# Patient Record
Sex: Male | Born: 1990
Health system: Southern US, Community
[De-identification: ages and names within clinical notes are randomized; demographics above are authoritative.]

## PROBLEM LIST (undated history)

## (undated) VITALS — BP 114/67 | HR 83 | Temp 98.5°F | Resp 16 | Ht 70.5 in | Wt 145.0 lb

## (undated) DIAGNOSIS — F419 Anxiety disorder, unspecified: Secondary | ICD-10-CM

## (undated) DIAGNOSIS — Z789 Other specified health status: Secondary | ICD-10-CM

## (undated) DIAGNOSIS — F209 Schizophrenia, unspecified: Secondary | ICD-10-CM

## (undated) DIAGNOSIS — F32A Depression, unspecified: Secondary | ICD-10-CM

---
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
## 2011-09-04 ENCOUNTER — Encounter (HOSPITAL_COMMUNITY): Payer: Self-pay | Admitting: *Deleted

## 2011-09-04 ENCOUNTER — Emergency Department (HOSPITAL_COMMUNITY)
Admission: EM | Admit: 2011-09-04 | Discharge: 2011-09-05 | Disposition: A | Discharge status: Home / Self Care | Payer: Self-pay | Attending: Emergency Medicine | Admitting: Emergency Medicine

## 2011-09-04 ENCOUNTER — Emergency Department (HOSPITAL_COMMUNITY)
Admission: EM | Admit: 2011-09-04 | Discharge: 2011-09-04 | Disposition: A | Discharge status: Home / Self Care | Payer: Self-pay | Attending: Emergency Medicine | Admitting: Emergency Medicine

## 2011-09-04 ENCOUNTER — Encounter (HOSPITAL_COMMUNITY): Payer: Self-pay | Admitting: Nurse Practitioner

## 2011-09-04 DIAGNOSIS — B86 Scabies: Secondary | ICD-10-CM | POA: Insufficient documentation

## 2011-09-04 DIAGNOSIS — K1370 Unspecified lesions of oral mucosa: Secondary | ICD-10-CM

## 2011-09-04 DIAGNOSIS — B370 Candidal stomatitis: Secondary | ICD-10-CM

## 2011-09-04 MED ORDER — PERMETHRIN 5 % EX CREA: TOPICAL_CREAM | CUTANEOUS | Status: AC

## 2011-09-04 NOTE — Discharge Instructions (Signed)
Use permethrin cream as prescribed.  Wash bedding and all of your clothing in hot water!  Take benadryl for itching and try to avoid scratching as much as possible. Your family members should probably be treated as well.  Call [facility] [phone]) if you do not have a primary care doctor and would like assistance with finding one.    You may return to the ER if symptoms worsen or you have any other concerns.    Scabies Scabies are small bugs (mites) that burrow under the skin and cause red bumps and severe itching. These bugs can only be seen with a microscope. Scabies are highly contagious. They can spread easily from person to person by direct contact. They are also spread through sharing clothing or linens that have the scabies mites living in them. It is not unusual for an entire family to become infected through shared towels, clothing, or bedding.  HOME CARE INSTRUCTIONS   Your caregiver may prescribe a cream or lotion to kill the mites. If this cream is prescribed; massage the cream into the entire area of the body from the neck to the bottom of both feet. Also massage the cream into the scalp and face if your child is less than 98 year old. Avoid the eyes and mouth.   Leave the cream on for 8 to12 hours. Do not wash your hands after application. Your child should bathe or shower after the 8 to 12 hour application period. Sometimes it is helpful to apply the cream to your child at right before bedtime.   One treatment is usually effective and will eliminate approximately 95% of infestations. For severe cases, your caregiver may decide to repeat the treatment in 1 week. Everyone in your household should be treated with one application of the cream.   New rashes or burrows should not appear after successful treatment within 24 to 48 hours; however the itching and rash may last for 2 to 4 weeks after successful treatment. If your symptoms persist longer than this, see your caregiver.   Your  caregiver also may prescribe a medication to help with the itching or to help the rash go away more quickly.   Scabies can live on clothing or linens for up to 3 days. Your entire child's recently used clothing, towels, stuffed toys, and bed linens should be washed in hot water and then dried in a dryer for at least 20 minutes on high heat. Items that cannot be washed should be enclosed in a plastic bag for at least 3 days.   To help relieve itching, bathe your child in a cool bath or apply cool washcloths to the affected areas.   Your child may return to school after treatment with the prescribed cream.  SEEK MEDICAL CARE IF:   The itching persists longer than 4 weeks after treatment.   The rash spreads or becomes infected (the area has red blisters or yellow-tan crust).  Document Released: 05/12/2005 Document Revised: 05/01/2011 Document Reviewed: 09/20/2008 [facility] Patient Information 2012 [place], [place].

## 2011-09-04 NOTE — ED Provider Notes (Signed)
History     CSN: [id number]  Arrival date & time 09/04/11  1055   First MD Initiated Contact with Patient 09/04/11 1130      Chief Complaint  Patient presents with  . Rash    (Consider location/radiation/quality/duration/timing/severity/associated sxs/prior treatment) HPI History provided by pt.   Pt presents w/ severely pruritic rash x 1 week.  Started out on right hand and then spread to left hand, up arms, to waistline and inner thighs.  Has not taken anything for symptoms.  No one else in household with similar symptoms.  No pets in home.  Has been otherwise asymptomatic; denies recent fever/cough.   History reviewed. No pertinent past medical history.  No past surgical history on file.  No family history on file.  History  Substance Use Topics  . Smoking status: Never Smoker   . Smokeless tobacco: Not on file  . Alcohol Use: No      Review of Systems  All other systems reviewed and are negative.    Allergies  Review of patient's allergies indicates no known allergies.  Home Medications  No current outpatient prescriptions on file.  BP 112/63  Pulse 57  Temp(Src) 98.3 F (36.8 C) (Oral)  Resp 18  SpO2 100%  Physical Exam  Nursing note and vitals reviewed. Constitutional: He is oriented to person, place, and time. He appears well-developed and well-nourished. No distress.  HENT:  Head: Normocephalic and atraumatic.  Eyes:       Normal appearance  Neck: Normal range of motion.  Neurological: He is alert and oriented to person, place, and time.  Skin:       Diffuse, discrete, 1-23mm hyperpigmented and skin-colored papules (some scabbed) in webs of fingers, dorsal surface of hands, arms, waistline, inner aspect of thighs.  Pt scratching.  Psychiatric: He has a normal mood and affect. His behavior is normal.    ED Course  Procedures (including critical care time)  Labs Reviewed - No data to display No results found.   1. Scabies       MDM  Pt  presents w/ pruritic rash w/out associated sx.  Most likely has scabies based on exam.  D/c'd home w/ permethrin cream.  Recommended washing all clothing/bedding in hot water, to take benadryl for pruritis and avoid scratching.  Return precautions discussed.         Arie Sabina Irmo, [place] 09/04/11 1216

## 2011-09-04 NOTE — ED Notes (Signed)
The pt  Was here earlier today for scabies.  Now he has a lesion in his mouth for 2-3 hours

## 2011-09-04 NOTE — ED Notes (Signed)
C/o "bites" to hands, between fingers, back of legs, thighs x 1 week. describes as itchy. Denies new prouduct or medication use. No household contacts with similar complaint

## 2011-09-04 NOTE — ED Provider Notes (Signed)
History     CSN: [id number]  Arrival date & time 09/04/11  1055   First MD Initiated Contact with Patient 09/04/11 1130      Chief Complaint  Patient presents with  . Rash     HPI C/o "bites" to hands, between fingers, back of legs, thighs x 1 week. describes as itchy. Denies new prouduct or medication use. No household contacts with similar complaint  No past medical history on file.  No past surgical history on file.  No family history on file.  History  Substance Use Topics  . Smoking status: Never Smoker   . Smokeless tobacco: Not on file  . Alcohol Use: No      Review of Systems  All other systems reviewed and are negative.    Allergies  Review of patient's allergies indicates no known allergies.  Home Medications  No current outpatient prescriptions on file.  BP 112/63  Pulse 57  Temp(Src) 98.3 F (36.8 C) (Oral)  Resp 18  SpO2 100%  Physical Exam  Nursing note and vitals reviewed. Constitutional: He is oriented to person, place, and time. He appears well-developed and well-nourished. No distress.  HENT:  Head: Normocephalic and atraumatic.  Eyes: Pupils are equal, round, and reactive to light.  Neck: Normal range of motion.  Cardiovascular: Normal rate and intact distal pulses.   Pulmonary/Chest: No respiratory distress.  Abdominal: Normal appearance. He exhibits no distension.  Musculoskeletal: Normal range of motion.  Neurological: He is alert and oriented to person, place, and time. No cranial nerve deficit.  Skin: Skin is warm and dry. Rash noted.       Excoriations in the skin changes consistent with scabies noted on both hands and also antecubital area  Psychiatric: He has a normal mood and affect. His behavior is normal.    ED Course  Procedures (including critical care time)  Labs Reviewed - No data to display No results found.   1. Scabies       MDM          Nelia Shi, MD 09/04/11 [phone]

## 2011-09-05 MED ORDER — MAGIC MOUTHWASH W/LIDOCAINE: 5.0000 mL | Freq: Three times a day (TID) | ORAL | Status: DC | PRN

## 2011-09-05 NOTE — Discharge Instructions (Signed)
Please followup with the primary care provider or dental specialist for continued evaluation and treatment.  Thrush, Adult  Jake Neal is a yeast infection that develops in the mouth and throat and on the tongue. The medical term for this is oropharyngeal candidiasis, or OPC. Jake Neal is most common in older adults, but it can occur at any age. Jake Neal occurs when a yeast called candida grows out of control. Candida normally is present in small amounts in the mouth and on other mucous membranes. However, under certain circumstances, candida can grow rapidly, causing thrush. Jake Neal can be a recurring problem for people who have chronic illnesses or who take medications that limit the body's ability to fight infection (weakened immune system). Since these people have difficulty fighting infections, the fungus that causes thrush can spread throughout the body. This can cause life-threatening blood or organ infections. CAUSES  Candida, the yeast that causes thrush, is normally present in small amounts in the mouth and on other mucous membranes. It usually causes no harm. However, when conditions are present that allow the yeast to grow uncontrolled, it invades surrounding tissues and becomes an infection. Jake Neal is most commonly caused by the yeast Candida albicans. Less often, other forms of candida can lead to thrush. There are many types of bacteria in your mouth that normally control the growth of candida. Sometimes a new type of bacteria gets into your mouth and disrupts the balance of the germs already there. This can allow candida to overgrow. Other factors that increase your risk of developing thrush include:  An impaired ability to fight infection (weakened immune system). A normal immune system is usually strong enough to prevent candida from overgrowing.   Older adults are more likely to develop thrush because they may have weaker immune systems.   People with human immunodeficiency virus (HIV) infection  have a high likelihood of developing thrush. About 90% of people with HIV develop thrush at some point during the course of their disease.   People with diabetes are more likely to get thrush because high blood sugar levels promote overgrowth of the candida fungus.   A dry mouth (xerostomia). Dry mouth can result from overuse of mouthwashes or from certain conditions such as Sjgren's syndrome.   Pregnancy. Hormone changes during pregnancy can lead to thrush by altering the balance of bacteria in the mouth.   Poor dental care, especially in people who have false teeth.   The use of antibiotic medications. This may lead to thrush by changing the balance of bacteria in the mouth.  SYMPTOMS  Thrush can be a mild infection that causes no symptoms. If symptoms develop, they may include the following:  A burning feeling in the mouth and throat. This can occur at the start of a thrush infection.   White patches that adhere to the mouth and tongue. The tissue around the patches may be red, raw, and painful. If rubbed (during tooth brushing, for example), the patches and the tissue of the mouth may bleed easily.   A bad taste in the mouth or difficulty tasting foods.   Cottony feeling in the mouth.   Sometimes pain during eating and swallowing.  DIAGNOSIS  Your caregiver can usually diagnose thrush by exam. In addition to looking in your mouth, your caregiver will ask you questions about your health. TREATMENT  Medications that help prevent the growth of fungi (antifungals) are the standard treatment for thrush. These medications are either applied directly to the affected area (topical) or swallowed (  oral). Mild thrush In adults, mild cases of thrush may clear up with simple treatment that can be done at home. This treatment usually involves using an antifungal mouth rinse or lozenges. Treatment usually lasts about 14 days. Moderate to severe thrush  More severe thrush infections that have  spread to the esophagus are treated with an oral antifungal medication. A topical antifungal medication may also be used.   For some severe infections, a treatment period longer than 14 days may be needed.   Oral antifungal medications are almost never used during pregnancy because the fetus may be harmed. However, if a pregnant woman has a rare, severe thrush infection that has spread to her blood, oral antifungal medications may be used. In this case, the risk of harm to the mother and fetus from the severe thrush infection may be greater than the risk posed by the use of antifungal medications.  Persistent or recurrent thrush Persistent (does not go away) or recurrent (keeps coming back) cases of thrush may:  Need to be treated twice as long as the symptoms last.   Require treatment with both oral and topical antifungal medications.   People with weakened immune systems can take an antifungal medication on a continuous basis to prevent thrush infections.  It is important to treat conditions that make you more likely to get thrush, such as diabetes, human immunodeficiency virus (HIV), or cancer.  HOME CARE INSTRUCTIONS   If you are breast-feeding, you should clean your nipples with an antifungal medication, such as nystatin (Mycostatin). Dry your nipples after breast-feeding. Applying lanolin-containing body lotion may help relieve nipple soreness.   If you wear dentures and get thrush, remove dentures before going to bed, brush them vigorously, and soak in a solution of chlorhexidine gluconate or a product such as Polident or Efferdent.   Eating plain, unflavored yogurt that contains live cultures (check the label) can also help cure thrush. Yogurt helps healthy bacteria grow in the mouth. These bacteria stop the growth of the yeast that causes thrush.   Adults can treat thrush at home with gentian violet (1%), a dye that kills bacteria and fungi. It is available without a prescription. If  there is no known cause for the infection or if gentian violet does not cure the thrush, you need to see your caregiver.  Comfort measures Measures can be taken to reduce the discomfort of thrush:  Drink cold liquids such as water or iced tea. Eat flavored ice treats or frozen juices.   Eat foods that are easy to swallow such as gelatin, ice cream, or custard.   If the patches are painful, try drinking from a straw.   Rinse your mouth several times a day with a warm saltwater rinse. You can make the saltwater mixture with 1 tsp (5 g) of salt in 8 fl oz (0.2 L) of warm water.  PROGNOSIS   Most cases of thrush are mild and clear up with the use of an antifungal mouth rinse or lozenges. Very mild cases of thrush may clear up without medical treatment. It usually takes about 14 days of treatment with an oral antifungal medication to cure more severe thrush infections. In some cases, thrush may last several weeks even with treatment.   If thrush goes untreated and does not go away by itself, it can spread to other parts of the body.   Thrush can spread to the throat, the vagina, or the skin. It rarely spreads to other organs of the  body.  Jake Neal is more likely to recur (come back) in:  People who use inhaled corticosteroids to treat asthma.   People who take antibiotic medications for a long time.   People who have false teeth.   People who have a weakened immune system.  RISKS AND COMPLICATIONS Complications related to thrush are rare in healthy people. There are several factors that can increase your risk of developing thrush. Age Older adults, especially those who have serious health problems, are more likely to develop thrush because their immune systems are likely to be weaker. Behavior  The yeast that causes thrush can be spread by oral sex.   Heavy smoking can lower the body's ability to fight off infections. This makes thrush more likely to develop.  Other conditions  False  teeth (dentures), braces, or a retainer that irritates the mouth make it hard to keep the mouth clean. An unclean mouth is more likely to develop thrush than a clean mouth.   People with a weakened immune system, such as those who have diabetes or human immunodeficiency virus (HIV) or who are undergoing chemotherapy, have an increased risk for developing thrush.  Medications Some medications can allow the fungus that causes thrush to grow uncontrolled. Common ones are:  Antibiotics, especially those that kill a wide range of organisms (broad-spectrum antibiotics), such as tetracycline commonly can cause thrush.   Birth control pills (oral contraceptives).   Medications that weaken the body's immune system, such as corticosteroids.  Environment Exposure over time to certain environmental chemicals, such as benzene and pesticides, can weaken the body's immune system. This increases your risk for developing infections, including thrush. SEEK IMMEDIATE MEDICAL CARE IF:  Your symptoms are getting worse or are not improving within 7 days of starting treatment.   You have symptoms of spreading infection, such as white patches on the skin outside of the mouth.   You are nursing and you have redness and pain in the nipples in spite of home treatment or if you have burning pain in the nipple area when you nurse. Your baby's mouth should also be examined to determine whether thrush is causing your symptoms.  Document Released: 02/05/2004 Document Revised: 05/01/2011 Document Reviewed: 05/17/2008 [facility] Patient Information 2012 [place], [place].   RESOURCE GUIDE  Dental Problems  Patients with Medicaid: [facility] [phone] [address]                                           [phone] [address] Phone:  [phone]                                                  Phone:  [phone]  If unable to pay or uninsured, contact:  [facility] or [facility] to become qualified for the adult dental clinic.  Chronic Pain Problems Contact [facility]  [phone] Patients need to be referred by their primary care doctor.  Insufficient Money for Medicine Contact [employer]:  call "211" or [facility] (770) [phone].  No Primary Care Doctor Call [facility]  [phone]  Other agencies that provide inexpensive medical care    [facility]  [phone]    [facility]  [phone]    [facility]  [phone]    [facility]  [phone]    [facility]  [phone]    [facility]  [phone]  Psychological Services [facility]  [phone] [facility]  [phone] [facility]   [phone] (emergency services (620)[phone])  Substance Abuse Resources [facility]  ([phone] [facility] ([phone] [facility] [phone] Floydene Flock [phone] Residential & Outpatient Substance Abuse Program  [phone]  Abuse/Neglect [facility] Child Abuse Hotline 615-066-8449 [facility] Child Abuse Hotline (551)507-2474 (After Hours)  Emergency Shelter [facility] (947)759-8873  Maternity Homes [facility] at [facility] (669)023-1788 [facility] [phone]  MRSA Hotline #:   562-788-0424    [facility] Resources  [facility] [place]     [employer]                          [facility] Dept. [address] [place]                       [address]      [address]  Blondell Reveal Phone:  [phone]                                   Phone:  [phone]                 Phone:  [phone]  [facility] Phone:  [phone]  [facility] Child Abuse Hotline 567-537-0568 [phone] (After  Hours)

## 2011-09-05 NOTE — ED Provider Notes (Signed)
History     CSN: [id number]  Arrival date & time 09/04/11  2258   First MD Initiated Contact with Patient 09/05/11 0029      Chief Complaint  Patient presents with  . Mouth Lesions     HPI  History provided by the patient and recent chart. Patient is a 21 year old male with no significant past medical history who presents with concerns for mouth lesions and sores. Patient was seen yesterday in the afternoon for complaints of generalized pruritic rash. Patient diagnosed with scabies and provided prescriptions. Patient states creatinine mentioned some spots on top of his mouth he has noticed. He denies any significant pain to spots. Patient is curious if it is related to the scabies infection or something else. Patient denies any other symptoms. He denies swelling of the mouth, tongue, lips. He denies any difficulty swallowing or breathing. He denies swelling of the throat. Patient denies any fever, chills, sweats. Patient denies any weight change. She has no other significant past medical history.    History reviewed. No pertinent past medical history.  History reviewed. No pertinent past surgical history.  No family history on file.  History  Substance Use Topics  . Smoking status: Never Smoker   . Smokeless tobacco: Not on file  . Alcohol Use: No      Review of Systems  Constitutional: Negative for fever, chills, appetite change and unexpected weight change.  HENT: Negative for sore throat.   Respiratory: Negative for cough and shortness of breath.   Gastrointestinal: Negative for nausea, vomiting, abdominal pain, diarrhea and constipation.  Skin: Positive for rash.    Allergies  Review of patient's allergies indicates no known allergies.  Home Medications   Current Outpatient Rx  Name Route Sig Dispense Refill  . PERMETHRIN 5 % EX CREA  Apply cream neck to toes and wash off 8-14hrs later and repeat in 1wk. 60 g 0    BP 99/78  Pulse 72  Temp(Src) 98.4 F (36.9  C) (Oral)  Resp 16  SpO2 96%  Physical Exam  Nursing note and vitals reviewed. Constitutional: He is oriented to person, place, and time. He appears well-developed and well-nourished. No distress.  HENT:  Head: Normocephalic and atraumatic.       Small white plaques to the roof of the mouth. This is not scrape off easily. No erythema. No tenderness. No swelling of gum. Tongue normal.  Neck: Normal range of motion. Neck supple. No tracheal deviation present.       No meningeal signs. No masses.  Cardiovascular: Normal rate and regular rhythm.   Pulmonary/Chest: Effort normal and breath sounds normal. No stridor.  Neurological: He is alert and oriented to person, place, and time.  Psychiatric: He has a normal mood and affect. His behavior is normal.    ED Course  Procedures      1. Oral lesion   2. Thrush       MDM  12:30 AM patient seen and evaluated. Patient in no acute distress.        Angus Seller, PA 09/05/11 0500

## 2011-09-05 NOTE — ED Provider Notes (Signed)
Medical screening examination/treatment/procedure(s) were performed by non-physician practitioner and as supervising physician I was immediately available for consultation/collaboration.    Celene Kras, MD 09/05/11 [phone]

## 2011-09-06 NOTE — ED Provider Notes (Signed)
Medical screening examination/treatment/procedure(s) were performed by non-physician practitioner and as supervising physician I was immediately available for consultation/collaboration.    Nelia Shi, MD 09/06/11 1136

## 2012-03-19 ENCOUNTER — Emergency Department (HOSPITAL_COMMUNITY): Payer: No Typology Code available for payment source

## 2012-03-19 ENCOUNTER — Emergency Department (HOSPITAL_COMMUNITY)
Admission: EM | Admit: 2012-03-19 | Discharge: 2012-03-19 | Disposition: A | Discharge status: Home / Self Care | Payer: No Typology Code available for payment source | Attending: Emergency Medicine | Admitting: Emergency Medicine

## 2012-03-19 DIAGNOSIS — Y9389 Activity, other specified: Secondary | ICD-10-CM | POA: Insufficient documentation

## 2012-03-19 DIAGNOSIS — S5000XA Contusion of unspecified elbow, initial encounter: Secondary | ICD-10-CM | POA: Insufficient documentation
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
## 2012-05-07 ENCOUNTER — Emergency Department (HOSPITAL_COMMUNITY)
Admission: EM | Admit: 2012-05-07 | Discharge: 2012-05-07 | Disposition: A | Discharge status: Home / Self Care | Payer: No Typology Code available for payment source | Attending: Emergency Medicine | Admitting: Emergency Medicine

## 2012-05-07 ENCOUNTER — Encounter (HOSPITAL_COMMUNITY): Payer: Self-pay | Admitting: *Deleted

## 2012-05-07 DIAGNOSIS — K5900 Constipation, unspecified: Secondary | ICD-10-CM

## 2012-05-07 LAB — URINALYSIS, ROUTINE W REFLEX MICROSCOPIC
Hgb urine dipstick: NEGATIVE
Ketones, ur: 15 mg/dL — AB
Leukocytes, UA: NEGATIVE
Nitrite: NEGATIVE
Protein, ur: NEGATIVE mg/dL
Specific Gravity, Urine: 1.03 (ref 1.005–1.030)
Urobilinogen, UA: 1 mg/dL (ref 0.0–1.0)
pH: 6 (ref 5.0–8.0)

## 2012-05-07 LAB — CBC WITH DIFFERENTIAL/PLATELET
Basophils Relative: 1 % (ref 0–1)
Eosinophils Relative: 1 % (ref 0–5)
HCT: 44.4 % (ref 39.0–52.0)
Hemoglobin: 16.1 g/dL (ref 13.0–17.0)
MCH: 32.4 pg (ref 26.0–34.0)
Monocytes Absolute: 0.4 10*3/uL (ref 0.1–1.0)
Neutrophils Relative %: 54 % (ref 43–77)
Platelets: 196 10*3/uL (ref 150–400)

## 2012-05-07 LAB — COMPREHENSIVE METABOLIC PANEL
ALT: 10 U/L (ref 0–53)
AST: 14 U/L (ref 0–37)
Albumin: 4.5 g/dL (ref 3.5–5.2)
Alkaline Phosphatase: 74 U/L (ref 39–117)
BUN: 12 mg/dL (ref 6–23)
Calcium: 10 mg/dL (ref 8.4–10.5)
Creatinine, Ser: 1.03 mg/dL (ref 0.50–1.35)
Potassium: 3.6 mEq/L (ref 3.5–5.1)
Total Bilirubin: 1.2 mg/dL (ref 0.3–1.2)
Total Protein: 7.7 g/dL (ref 6.0–8.3)

## 2012-05-07 MED ORDER — MAGNESIUM HYDROXIDE 400 MG/5ML PO SUSP: 30.0000 mL | Freq: Every day | ORAL | Status: DC | PRN

## 2012-05-07 MED ORDER — BISACODYL 5 MG PO TBEC: 5.0000 mg | DELAYED_RELEASE_TABLET | Freq: Every day | ORAL | Status: DC | PRN

## 2012-05-07 NOTE — ED Notes (Signed)
Pt presents with mid abdominal pain and inability to have a bowel movement for the past 5 days.  Denies any difficulty with urination.  Denies N/V, pt in NAD at this time.

## 2012-05-07 NOTE — ED Notes (Signed)
The pt has not had a bm for 5-7 days.  He has abd pain and he cannot eat .  He has not had a history of the same

## 2012-05-07 NOTE — ED Provider Notes (Signed)
History     CSN: [id number] Arrival date & time 05/07/12  1655 First MD Initiated Contact with Patient 05/07/12 2239      Chief Complaint  Patient presents with  . Constipation    HPI Pt has had constipation for the last 4 days at least.  He has not tried taking any medications.  He can eat without difficulty.  His abdomen feels tight though.  No fevers.  No vomiting.  No trouble urinating.  History reviewed. No pertinent past medical history.  History reviewed. No pertinent past surgical history.  No family history on file.  History  Substance Use Topics  . Smoking status: Never Smoker   . Smokeless tobacco: Not on file  . Alcohol Use: No      Review of Systems  All other systems reviewed and are negative.    Allergies  Review of patient's allergies indicates no known allergies.  Home Medications  No current outpatient prescriptions on file.  BP 113/65  Pulse 113  Temp 98.6 F (37 C) (Oral)  Resp 20  SpO2 100%  Physical Exam  Nursing note and vitals reviewed. Constitutional: He appears well-developed and well-nourished. No distress.  HENT:  Head: Normocephalic and atraumatic.  Right Ear: External ear normal.  Left Ear: External ear normal.  Eyes: Conjunctivae normal are normal. Right eye exhibits no discharge. Left eye exhibits no discharge. No scleral icterus.  Neck: Neck supple. No tracheal deviation present.  Cardiovascular: Normal rate, regular rhythm and intact distal pulses.   Pulmonary/Chest: Effort normal and breath sounds normal. No stridor. No respiratory distress. He has no wheezes. He has no rales.  Abdominal: Soft. Bowel sounds are normal. He exhibits no distension. There is no tenderness. There is no rebound and no guarding.  Genitourinary: Rectum normal. Rectal exam shows no mass and no tenderness.  Musculoskeletal: He exhibits no edema and no tenderness.  Neurological: He is alert. He has normal strength. No sensory deficit. Cranial nerve  deficit:  no gross defecits noted. He exhibits normal muscle tone. He displays no seizure activity. Coordination normal.  Skin: Skin is warm and dry. No rash noted.  Psychiatric: He has a normal mood and affect.    ED Course  Procedures (including critical care time)  Labs Reviewed  URINALYSIS, ROUTINE W REFLEX MICROSCOPIC - Abnormal; Notable for the following:    APPearance CLOUDY (*)     Ketones, ur 15 (*)     All other components within normal limits  CBC WITH DIFFERENTIAL - Abnormal; Notable for the following:    MCHC 36.3 (*)     All other components within normal limits  COMPREHENSIVE METABOLIC PANEL  LIPASE, BLOOD   No results found.   1. Constipation       MDM  Pt has a benign abdominal exam.  He actually has been able to eat but his appetite has been off because of the bloating.  Will dc home with medications for constipation.  Return for fever, worsening symptoms        Celene Kras, MD 05/07/12 2324

## 2012-05-12 ENCOUNTER — Encounter (HOSPITAL_COMMUNITY): Payer: Self-pay | Admitting: *Deleted

## 2012-05-12 ENCOUNTER — Emergency Department (INDEPENDENT_AMBULATORY_CARE_PROVIDER_SITE_OTHER)
Admission: EM | Admit: 2012-05-12 | Discharge: 2012-05-12 | Disposition: A | Discharge status: Home / Self Care | Payer: Self-pay | Source: Home / Self Care | Attending: Family Medicine | Admitting: Family Medicine

## 2012-05-12 DIAGNOSIS — M545 Low back pain: Secondary | ICD-10-CM

## 2012-05-12 MED ORDER — CYCLOBENZAPRINE HCL 10 MG PO TABS: 10.0000 mg | ORAL_TABLET | Freq: Two times a day (BID) | ORAL | Status: DC | PRN

## 2012-05-12 MED ORDER — IBUPROFEN 600 MG PO TABS: 600.0000 mg | ORAL_TABLET | Freq: Three times a day (TID) | ORAL | Status: DC | PRN
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
## 2016-07-04 ENCOUNTER — Inpatient Hospital Stay (HOSPITAL_COMMUNITY): Payer: Federal, State, Local not specified - Other

## 2016-07-04 ENCOUNTER — Inpatient Hospital Stay (HOSPITAL_COMMUNITY)
Admission: AD | Admit: 2016-07-04 | Discharge: 2016-07-11 | DRG: 897 | Disposition: A | Discharge status: Home / Self Care | Payer: Federal, State, Local not specified - Other | Attending: Emergency Medicine | Admitting: Emergency Medicine

## 2016-07-04 ENCOUNTER — Encounter (HOSPITAL_COMMUNITY): Payer: Self-pay | Admitting: *Deleted

## 2016-07-04 DIAGNOSIS — F323 Major depressive disorder, single episode, severe with psychotic features: Secondary | ICD-10-CM | POA: Diagnosis present

## 2016-07-04 DIAGNOSIS — F419 Anxiety disorder, unspecified: Secondary | ICD-10-CM | POA: Diagnosis present

## 2016-07-04 DIAGNOSIS — R0789 Other chest pain: Secondary | ICD-10-CM | POA: Diagnosis present

## 2016-07-04 DIAGNOSIS — F32A Depression, unspecified: Secondary | ICD-10-CM

## 2016-07-04 DIAGNOSIS — F19950 Other psychoactive substance use, unspecified with psychoactive substance-induced psychotic disorder with delusions: Principal | ICD-10-CM | POA: Diagnosis present

## 2016-07-04 DIAGNOSIS — F332 Major depressive disorder, recurrent severe without psychotic features: Secondary | ICD-10-CM

## 2016-07-04 DIAGNOSIS — F1290 Cannabis use, unspecified, uncomplicated: Secondary | ICD-10-CM | POA: Diagnosis present

## 2016-07-04 DIAGNOSIS — F329 Major depressive disorder, single episode, unspecified: Secondary | ICD-10-CM

## 2016-07-04 HISTORY — DX: Other specified health status: Z78.9

## 2016-07-04 LAB — COMPREHENSIVE METABOLIC PANEL
ALBUMIN: 4.7 g/dL (ref 3.5–5.0)
ALK PHOS: 48 U/L (ref 38–126)
ALT: 9 U/L — ABNORMAL LOW (ref 17–63)
AST: 15 U/L (ref 15–41)
Anion gap: 8 (ref 5–15)
BUN: 12 mg/dL (ref 6–20)
CALCIUM: 9.8 mg/dL (ref 8.9–10.3)
CO2: 31 mmol/L (ref 22–32)
CREATININE: 1.21 mg/dL (ref 0.61–1.24)
Chloride: 102 mmol/L (ref 101–111)
GFR calc non Af Amer: >60 mL/min (ref >60)
GLUCOSE: 80 mg/dL (ref 65–99)
Potassium: 4 mmol/L (ref 3.5–5.1)
SODIUM: 141 mmol/L (ref 135–145)
Total Bilirubin: 1 mg/dL (ref 0.3–1.2)
Total Protein: 7.5 g/dL (ref 6.5–8.1)

## 2016-07-04 LAB — CBC
HCT: 41 % (ref 39.0–52.0)
HCT: 42.9 % (ref 39.0–52.0)
Hemoglobin: 14.3 g/dL (ref 13.0–17.0)
Hemoglobin: 14.9 g/dL (ref 13.0–17.0)
MCH: 31.7 pg (ref 26.0–34.0)
MCH: 32 pg (ref 26.0–34.0)
MCHC: 34.9 g/dL (ref 30.0–36.0)
MCHC: 34.7 g/dL (ref 30.0–36.0)
MCV: 90.9 fL (ref 78.0–100.0)
MCV: 92.1 fL (ref 78.0–100.0)
Platelets: 183 10*3/uL (ref 150–400)
Platelets: 194 10*3/uL (ref 150–400)
RBC: 4.51 MIL/uL (ref 4.22–5.81)
RBC: 4.66 MIL/uL (ref 4.22–5.81)
RDW: 12.5 % (ref 11.5–15.5)
RDW: 12.5 % (ref 11.5–15.5)
WBC: 3.5 10*3/uL — ABNORMAL LOW (ref 4.0–10.5)
WBC: 4.6 10*3/uL (ref 4.0–10.5)

## 2016-07-04 LAB — RAPID URINE DRUG SCREEN, HOSP PERFORMED
AMPHETAMINES: NOT DETECTED
Barbiturates: NOT DETECTED
Benzodiazepines: NOT DETECTED
Cocaine: NOT DETECTED
Opiates: NOT DETECTED
TETRAHYDROCANNABINOL: POSITIVE — AB

## 2016-07-04 LAB — BASIC METABOLIC PANEL
ANION GAP: 6 (ref 5–15)
BUN: 11 mg/dL (ref 6–20)
CALCIUM: 9.4 mg/dL (ref 8.9–10.3)
CO2: 29 mmol/L (ref 22–32)
Chloride: 103 mmol/L (ref 101–111)
Creatinine, Ser: 0.93 mg/dL (ref 0.61–1.24)
GLUCOSE: 82 mg/dL (ref 65–99)
Potassium: 4.4 mmol/L (ref 3.5–5.1)
SODIUM: 138 mmol/L (ref 135–145)

## 2016-07-04 LAB — TSH: TSH: 1.583 u[IU]/mL (ref 0.350–4.500)

## 2016-07-04 LAB — ETHANOL: Alcohol, Ethyl (B): <5 mg/dL (ref <5)

## 2016-07-04 MED ORDER — HYDROXYZINE HCL 25 MG PO TABS
25.0000 mg | ORAL_TABLET | Freq: Four times a day (QID) | ORAL | Status: DC | PRN
  Administered 2016-07-04 – 2016-07-06 (×2): 25 mg via ORAL
  Filled 2016-07-04: qty 1
  Filled 2016-07-04: qty 10

## 2016-07-04 MED ORDER — TRAZODONE HCL 50 MG PO TABS
50.0000 mg | ORAL_TABLET | Freq: Every evening | ORAL | Status: DC | PRN
  Administered 2016-07-04 – 2016-07-08 (×4): 50 mg via ORAL
  Filled 2016-07-04 (×3): qty 1

## 2016-07-04 MED ORDER — ALUM & MAG HYDROXIDE-SIMETH 200-200-20 MG/5ML PO SUSP: 30.0000 mL | ORAL | Status: DC | PRN

## 2016-07-04 MED ORDER — TRAZODONE HCL 50 MG PO TABS: 50.0000 mg | ORAL_TABLET | Freq: Every evening | ORAL | Status: DC | PRN

## 2016-07-04 MED ORDER — ACETAMINOPHEN 325 MG PO TABS: 650.0000 mg | ORAL_TABLET | Freq: Four times a day (QID) | ORAL | Status: DC | PRN

## 2016-07-04 MED ORDER — ACETAMINOPHEN 325 MG PO TABS
650.0000 mg | ORAL_TABLET | Freq: Four times a day (QID) | ORAL | Status: DC | PRN
Start: 2016-07-04 — End: 2016-07-04

## 2016-07-04 MED ORDER — MAGNESIUM HYDROXIDE 400 MG/5ML PO SUSP
30.0000 mL | Freq: Every day | ORAL | Status: DC | PRN
  Filled 2016-07-04: qty 30

## 2016-07-04 MED ORDER — HYDROXYZINE HCL 25 MG PO TABS
25.0000 mg | ORAL_TABLET | Freq: Four times a day (QID) | ORAL | Status: DC | PRN
  Filled 2016-07-04 (×3): qty 1

## 2016-07-04 NOTE — H&P (Signed)
Behavioral Health Medical Screening Exam  Jake Neal is an 26 y.o. male.  Total Time spent with patient: 15 minutes  Psychiatric Specialty Exam: Physical Exam  Constitutional: He is oriented to person, place, and time. He appears well-developed and well-nourished.  HENT:  Head: Normocephalic.  Neck: Normal range of motion.  Cardiovascular: Normal rate and normal heart sounds.   Respiratory: Effort normal and breath sounds normal.  GI: Soft. Bowel sounds are normal.  Musculoskeletal: Normal range of motion.  Neurological: He is alert and oriented to person, place, and time.  Skin: Skin is warm and dry.    Review of Systems  Psychiatric/Behavioral: Positive for depression and suicidal ideas. Negative for hallucinations, memory loss and substance abuse. The patient is nervous/anxious. The patient does not have insomnia.     Blood pressure 119/75, pulse (!) 58, temperature 98.6 F (37 C), temperature source Oral, resp. rate 18, SpO2 99 %.There is no height or weight on file to calculate BMI.  General Appearance: Casual and Fairly Groomed  Eye Contact:  Good  Speech:  Clear and Coherent and Slow  Volume:  Normal  Mood:  Anxious, Depressed and Hopeless  Affect:  Congruent, Depressed and Flat  Thought Process:  Coherent and Linear  Orientation:  Full (Time, Place, and Person)  Thought Content:  Paranoid Ideation  Suicidal Thoughts:  Yes.  without intent/plan  Homicidal Thoughts:  No  Memory:  Immediate;   Good Recent;   Good Remote;   Fair  Judgement:  Good  Insight:  Fair  Psychomotor Activity:  Normal  Concentration: Concentration: Fair and Attention Span: Fair  Recall:  Good  Fund of Knowledge:Good  Language: Good  Akathisia:  No  Handed:  Right  AIMS (if indicated):     Assets:  Communication Skills Desire for Improvement Financial Resources/Insurance Housing Physical Health Resilience Social Support  Sleep:       Musculoskeletal: Strength & Muscle Tone:  within normal limits Gait & Station: normal Patient leans: N/A  Blood pressure 119/75, pulse (!) 58, temperature 98.6 F (37 C), temperature source Oral, resp. rate 18, SpO2 99 %.  Recommendations:  Based on my evaluation the patient does not appear to have an emergency medical condition.  Pt meets criteria for inpatient psychiatric admission.   Laveda AbbeLaurie Britton Aniston Christman, NP 07/04/2016, 10:27 AM

## 2016-07-04 NOTE — Progress Notes (Signed)
Patient attended AA group meeting.  

## 2016-07-04 NOTE — Progress Notes (Signed)
Baseline EKG taken per order.  Was not medically cleared in ED prior to entering unit.  Patient reports left sided chest pain 4/10 states it's worse with anxiety, EKG shows abnormality.  VS normal per flowsheet. Shown to MD, MD ordered patient sent to ED non-emergently via [facility] for med clearance.  [facility], patient sent with [facility].
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
## 2016-07-04 NOTE — ED Triage Notes (Signed)
[facility] and started having chest pain.

## 2016-07-04 NOTE — ED Notes (Signed)
Per MD, d/c istat troponin
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
## 2016-07-04 NOTE — Tx Team (Addendum)
Initial Treatment Plan 07/04/2016 11:56 AM Jake LangeJaquan A Silber [id number]    PATIENT STRESSORS: Financial difficulties Marital or family conflict   PATIENT STRENGTHS: Physical Health Supportive family/friends   PATIENT IDENTIFIED PROBLEMS: Depression   Substance use   "I see people looking and watching me"   "NIghtmares"                DISCHARGE CRITERIA:  Ability to meet basic life and health needs Improved stabilization in mood, thinking, and/or behavior Need for constant or close observation no longer present  PRELIMINARY DISCHARGE PLAN: Attend aftercare/continuing care group Return to previous living arrangement  PATIENT/FAMILY INVOLVEMENT: This treatment plan has been presented to and reviewed with the patient, Jake Neal.  The patient and family have been given the opportunity to ask questions and make suggestions.  Barton Fannyyson, Britney M, RN 07/04/2016, 11:56 AM

## 2016-07-04 NOTE — Plan of Care (Signed)
Problem: Activity: Goal: Interest or engagement in leisure activities will improve Outcome: Progressing Pt. attended AA meeting this evening.

## 2016-07-04 NOTE — ED Provider Notes (Signed)
[facility]-EMERGENCY DEPT Provider Note   CSN: [id number] Arrival date & time: 07/04/16  1034     History   Chief Complaint Chief Complaint  Patient presents with  . Depression  . Chest Pain    HPI Jake Neal is a 26 y.o. male.  PT with a hx of depression presents with CP.  He has had worsening anxiety, depression and hallucinations and [facility]  There, he was reporting CP and sent here for evaluation.  He reports that for the last 2 years, he has had intermittent pains to his left chest.  He says that he only has it when he has worsening anxiety and particularly when he is having hallucinations.  No exertional symptoms.  No SOB.  No cough or pleuritic pain.  No recent illnesses.  No trauma to the chest.  No leg pain or swelling.  No hx of drug use other than marijuana.  No hx of heart dz.      Past Medical History:  Diagnosis Date  . Medical history non-contributory     Patient Active Problem List   Diagnosis Date Noted  . MDD (major depressive disorder), recurrent severe, without psychosis (HCC) 07/04/2016    History reviewed. No pertinent surgical history.     Home Medications    Prior to Admission medications   Medication Sig Start Date End Date Taking? Authorizing Provider  bisacodyl (DULCOLAX) 5 MG EC tablet Take 1 tablet (5 mg total) by mouth daily as needed for constipation. Patient not taking: Reported on 07/04/2016 05/07/12   Linwood DibblesJon Knapp, MD  cyclobenzaprine (FLEXERIL) 10 MG tablet Take 1 tablet (10 mg total) by mouth 2 (two) times daily as needed for muscle spasms. Patient not taking: Reported on 07/04/2016 05/12/12   Christin FudgeAdlih Moreno-Coll, MD  ibuprofen (ADVIL,MOTRIN) 600 MG tablet Take 1 tablet (600 mg total) by mouth every 8 (eight) hours as needed for pain or fever. Patient not taking: Reported on 07/04/2016 05/12/12   Christin FudgeAdlih Moreno-Coll, MD  magnesium hydroxide (MILK OF MAGNESIA) 400 MG/5ML suspension Take 30 mLs by mouth daily as needed for  constipation. Patient not taking: Reported on 07/04/2016 05/07/12   Linwood DibblesJon Knapp, MD    Family History History reviewed. No pertinent family history.  Social History Social History  Substance Use Topics  . Smoking status: Never Smoker  . Smokeless tobacco: Never Used  . Alcohol use No     Allergies   Patient has no known allergies.   Review of Systems Review of Systems  Constitutional: Negative for chills, diaphoresis, fatigue and fever.  HENT: Negative for congestion, rhinorrhea and sneezing.   Eyes: Negative.   Respiratory: Negative for cough, chest tightness and shortness of breath.   Cardiovascular: Positive for chest pain. Negative for leg swelling.  Gastrointestinal: Negative for abdominal pain, blood in stool, diarrhea, nausea and vomiting.  Genitourinary: Negative for difficulty urinating, flank pain, frequency and hematuria.  Musculoskeletal: Negative for arthralgias and back pain.  Skin: Negative for rash.  Neurological: Negative for dizziness, speech difficulty, weakness, numbness and headaches.  Psychiatric/Behavioral: Positive for dysphoric mood and hallucinations. The patient is nervous/anxious.      Physical Exam Updated Vital Signs BP 117/63 (BP Location: Left Arm)   Pulse (!) 54   Temp 98.2 F (36.8 C) (Oral)   Resp 18   Ht 5' 10.5" (1.791 m)   Wt 145 lb (65.8 kg)   SpO2 100%   BMI 20.51 kg/m   Physical Exam  Constitutional: He  is oriented to person, place, and time. He appears well-developed and well-nourished.  HENT:  Head: Normocephalic and atraumatic.  Eyes: Pupils are equal, round, and reactive to light.  Neck: Normal range of motion. Neck supple.  Cardiovascular: Normal rate, regular rhythm and normal heart sounds.   Pulmonary/Chest: Effort normal and breath sounds normal. No respiratory distress. He has no wheezes. He has no rales. He exhibits no tenderness.  Abdominal: Soft. Bowel sounds are normal. There is no tenderness. There is no  rebound and no guarding.  Musculoskeletal: Normal range of motion. He exhibits no edema.  No leg pain or swelling  Lymphadenopathy:    He has no cervical adenopathy.  Neurological: He is alert and oriented to person, place, and time.  Skin: Skin is warm and dry. No rash noted.  Psychiatric: He has a normal mood and affect.     ED Treatments / Results  Labs (all labs ordered are listed, but only abnormal results are displayed) Labs Reviewed  RAPID URINE DRUG SCREEN, HOSP PERFORMED - Abnormal; Notable for the following:       Result Value   Tetrahydrocannabinol POSITIVE (*)    All other components within normal limits  CBC - Abnormal; Notable for the following:    WBC 3.5 (*)    All other components within normal limits  BASIC METABOLIC PANEL  ETHANOL  CBC  COMPREHENSIVE METABOLIC PANEL  HEMOGLOBIN A1C  LIPID PANEL  TSH  PROLACTIN  URINALYSIS, ROUTINE W REFLEX MICROSCOPIC  ETHANOL  I-STAT TROPOININ, ED    EKG  EKG Interpretation  Date/Time:  Friday July 04 2016 14:34:25 EST Ventricular Rate:  49 PR Interval:    QRS Duration: 90 QT Interval:  436 QTC Calculation: 393 R Axis:   82 Text Interpretation:  Junctional rhythm Abnormal ECG Normal sinus rhythm since last tracing no significant change Confirmed by Tayjon Halladay  MD, Fatim Vanderschaaf ([id number]) on 07/04/2016 3:56:14 PM       Radiology Dg Chest 2 View  Result Date: 07/04/2016 CLINICAL DATA:  Chest pain. EXAM: CHEST  2 VIEW COMPARISON:  02/23/2004 FINDINGS: The heart size and mediastinal contours are within normal limits. Both lungs are clear. The visualized skeletal structures are unremarkable. IMPRESSION: Normal chest. Electronically Signed   By: Francene Boyers M.D.   On: 07/04/2016 16:23    Procedures Procedures (including critical care time)  Medications Ordered in ED Medications  magnesium hydroxide (MILK OF MAGNESIA) suspension 30 mL (not administered)  hydrOXYzine (ATARAX/VISTARIL) tablet 25 mg (not administered)    acetaminophen (TYLENOL) tablet 650 mg (not administered)  alum & mag hydroxide-simeth (MAALOX/MYLANTA) 200-200-20 MG/5ML suspension 30 mL (not administered)  magnesium hydroxide (MILK OF MAGNESIA) suspension 30 mL (not administered)  hydrOXYzine (ATARAX/VISTARIL) tablet 25 mg (not administered)  traZODone (DESYREL) tablet 50 mg (not administered)     Initial Impression / Assessment and Plan / ED Course  I have reviewed the triage vital signs and the nursing notes.  Pertinent labs & imaging results that were available during my care of the patient were reviewed by me and considered in my medical decision making (see chart for details).     Pt is medically cleared to go back to [facility]  Final Clinical Impressions(s) / ED Diagnoses   Final diagnoses:  Depression, unspecified depression type  Atypical chest pain    New Prescriptions New Prescriptions   No medications on file     Rolan Bucco, MD 07/04/16 1700

## 2016-07-05 DIAGNOSIS — F332 Major depressive disorder, recurrent severe without psychotic features: Secondary | ICD-10-CM

## 2016-07-05 DIAGNOSIS — Z79899 Other long term (current) drug therapy: Secondary | ICD-10-CM

## 2016-07-05 DIAGNOSIS — R45851 Suicidal ideations: Secondary | ICD-10-CM

## 2016-07-05 LAB — PROLACTIN: PROLACTIN: 14 ng/mL (ref 4.0–15.2)

## 2016-07-05 LAB — LIPID PANEL
CHOL/HDL RATIO: 3.1 ratio
CHOLESTEROL: 156 mg/dL (ref 0–200)
HDL: 50 mg/dL (ref >40)
LDL Cholesterol: 92 mg/dL (ref 0–99)
TRIGLYCERIDES: 68 mg/dL (ref <150)
VLDL: 14 mg/dL (ref 0–40)

## 2016-07-05 LAB — HEMOGLOBIN A1C
Hgb A1c MFr Bld: 5.1 % (ref 4.8–5.6)
MEAN PLASMA GLUCOSE: 100 mg/dL

## 2016-07-05 MED ORDER — ARIPIPRAZOLE 2 MG PO TABS
2.0000 mg | ORAL_TABLET | Freq: Every day | ORAL | Status: DC
  Administered 2016-07-05: 2 mg via ORAL
  Filled 2016-07-05 (×3): qty 1

## 2016-07-05 MED ORDER — FLUOXETINE HCL 10 MG PO CAPS
10.0000 mg | ORAL_CAPSULE | Freq: Every day | ORAL | Status: DC
  Administered 2016-07-05 – 2016-07-08 (×4): 10 mg via ORAL
  Filled 2016-07-05 (×7): qty 1

## 2016-07-05 MED ORDER — ARIPIPRAZOLE 2 MG PO TABS
2.0000 mg | ORAL_TABLET | Freq: Every day | ORAL | Status: DC
  Administered 2016-07-06: 2 mg via ORAL
  Filled 2016-07-05 (×3): qty 1
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
## 2016-07-05 NOTE — [facility] Group Notes (Signed)
[facility] LCSW Group Therapy Note  07/05/2016  and  10:15 AM  Type of Therapy and Topic:  Group Therapy: Avoiding Self-Sabotaging and Engaging in Radical Acceptance  Participation Level:  Minimal  Participation Quality:  Attentive  Affect:  Depressed and Flat  Cognitive:  Alert and Oriented  Insight:  None noted  Engagement in Therapy:  Limited   Therapeutic models used: Cognitive Behavioral Therapy,  Person-Centered Therapy and Motivational Interviewing  Modes of Intervention:  Discussion, Exploration, Orientation, Rapport Building, Socialization and Support  Summary of Progress/Problems:  The main focus of today's process group was for the patient to identify ways in which they may be able to engage in Radical Acceptance verses self sabotage. Motivational Interviewing was utilized to identify motivation they may have for wanting to change. The group processed multiple fears associated with change including the fear of success. Jake Neal was quietly attentive yet hesitant to engage.   [name], LCSW

## 2016-07-05 NOTE — [facility] Group Notes (Signed)
[facility] Group Notes:  (Nursing/MHT/Case Management/Adjunct)  Date:  07/05/2016  Time:  2:14 PM  Type of Therapy:  Nurse Education  Participation Level:  Minimal  Participation Quality:  Inattentive  Affect:  Anxious  Cognitive:  Lacking  Insight:  Lacking  Engagement in Group:  Lacking  Modes of Intervention:  Education  Summary of Progress/Problems: Patient rated his energy level as 4/10. Minimal interaction. Discussion of communication styles.   Almira Barenny G Daylee Delahoz 07/05/2016, 2:14 PM

## 2016-07-05 NOTE — Progress Notes (Signed)
[facility] Group Notes:  (Nursing/MHT/Case Management/Adjunct)  Date:  07/05/2016  Time:  10:20 PM  Type of Therapy:  Psychoeducational Skills  Participation Level:  Active  Participation Quality:  Appropriate  Affect:  Appropriate  Cognitive:  Appropriate  Insight:  Appropriate  Engagement in Group:  Developing/Improving  Modes of Intervention:  Education  Summary of Progress/Problems:Patient stated that he had a rough start to his day, but his afternoon was more positive. He attributed his positive turn around to being able to go and play basketball with his peers. His goal for tomorrow is to have a better day.   Clarrissa Shimkus S 07/05/2016, 10:20 PM
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
## 2016-07-05 NOTE — Progress Notes (Signed)
D.  Pt pleasant but flat on approach, denies complaints at this time.  Positive for evening wrap up group, observed interacting appropriately with peers on the unit.  It was reported that Pt has a difficult time at night with voices and visual hallucinations and that this happens only at night.  Pt denies SI/HI/hallucinations at this time however.  A.  Support and encouragement offered  R.  Pt remains safe on the unit, will continue to monitor.
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
## 2016-07-05 NOTE — Progress Notes (Signed)
Nursing Note 07/05/2016 [id number]  Data Reports sleeping fair with PRN sleep med.  Rates depression 6/10, hopelessness 9/10, and anxiety 5/10. Affect blunted but appropriate.   Denies HI, SI.  Reports last night he saw people around his bed looking at him.  Attending groups.  Minimal and forwards little.    Action Spoke with patient 1:1, nurse offered support to patient throughout shift.  Continues to be monitored on 15 minute checks for safety.  Response Remains safe and appropriate on unit.

## 2016-07-06 LAB — LIPID PANEL
Cholesterol: 157 mg/dL (ref 0–200)
HDL: 49 mg/dL (ref >40)
LDL Cholesterol: 97 mg/dL (ref 0–99)
TRIGLYCERIDES: 56 mg/dL (ref <150)
Total CHOL/HDL Ratio: 3.2 RATIO
VLDL: 11 mg/dL (ref 0–40)

## 2016-07-06 LAB — TSH: TSH: 0.936 u[IU]/mL (ref 0.350–4.500)

## 2016-07-06 NOTE — Progress Notes (Signed)
D:  Patient awake and alert; oriented x 4; he denies suicidal and homicidal ideation and AVH; no self-injurious behaviors noted or reported. A:  Medications given as scheduled;  Emotional support provided; encouraged him to seek assistance with needs/concerns. R:  Safety maintained on unit. 

## 2016-07-06 NOTE — Progress Notes (Signed)
The patient attended this evening's A.A.meeting and was appropriate.  

## 2016-07-06 NOTE — Plan of Care (Signed)
Problem: Health Behavior/Discharge Planning: Goal: Compliance with therapeutic regimen will improve Outcome: Progressing Patient compliant with therapeutic regimen  Problem: Safety: Goal: Ability to disclose and discuss suicidal ideas will improve Outcome: Progressing Patient denies suicidal ideation  Problem: Safety: Goal: Ability to remain free from injury will improve Outcome: Progressing Safety maintained on unit
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
## 2016-07-06 NOTE — Progress Notes (Signed)
D.  Pt appears flat but pleasant on approach, complaint of insomnia.  Pt was positive for evening AA group, observed interacting appropriately with peers on the unit.  Pt denies SI/HI/hallucinations at this time.   A.  Support and encouragement offered, medications given as ordered, with vistaril added in hopes of improving quality of sleep.  R.  Pt remains safe on the unit, will continue to monitor.

## 2016-07-06 NOTE — [facility] Group Notes (Signed)
[facility] LCSW Group Therapy Note   07/06/2016  10:15  AM   Type of Therapy and Topic: Group Therapy: Feelings Around Returning Home & Establishing a Supportive Framework and Activity to Identify signs of Improvement or Decompensation   Participation Level: Did Not Attend invited to participate yet did not despite overhead announcement and encouragement by staff   Catherine C Harrill, LCSW    

## 2016-07-07 DIAGNOSIS — F1290 Cannabis use, unspecified, uncomplicated: Secondary | ICD-10-CM

## 2016-07-07 DIAGNOSIS — F323 Major depressive disorder, single episode, severe with psychotic features: Secondary | ICD-10-CM

## 2016-07-07 LAB — PROLACTIN: Prolactin: 9.9 ng/mL (ref 4.0–15.2)

## 2016-07-07 MED ORDER — ARIPIPRAZOLE 5 MG PO TABS
5.0000 mg | ORAL_TABLET | Freq: Every day | ORAL | Status: DC
  Administered 2016-07-07 – 2016-07-08 (×2): 5 mg via ORAL
  Filled 2016-07-07 (×4): qty 1

## 2016-07-07 NOTE — Progress Notes (Signed)
Recreation Therapy Notes  Date: 07/07/16 Time: 0930 Location: [address] Group Room  Group Topic: Stress Management  Goal Area(s) Addresses:  Patient will verbalize importance of using healthy stress management.  Patient will identify positive emotions associated with healthy stress management.   Intervention: Stress Management  Activity :  Meditation.  LRT played a meditation on how to deal with stress from the Calm app.  Patients were to follow along as the meditation played.  Education:  Stress Management, Discharge Planning.   Education Outcome: Acknowledges edcuation/In group clarification offered/Needs additional education  Clinical Observations/Feedback: Pt did not attend group.   Katrice Goel, LRT/CTRS         Gordon Vandunk A 07/07/2016 1:04 PM 

## 2016-07-07 NOTE — Tx Team (Signed)
Interdisciplinary Treatment and Diagnostic Plan Update  07/07/2016 Time of Session: 9:30 AM Jake Neal MRN: [id number]  Principal Diagnosis: MDD (major depressive disorder), recurrent severe, without psychosis ([name])  Secondary Diagnoses: Principal Problem:   MDD (major depressive disorder), recurrent severe, without psychosis ([name])   Current Medications:  Current Facility-Administered Medications  Medication Dose Route Frequency Provider Last Rate Last Dose  . acetaminophen (TYLENOL) tablet 650 mg  650 mg Oral Q6H PRN [name], NP      . alum & mag hydroxide-simeth (MAALOX/MYLANTA) 200-200-20 MG/5ML suspension 30 mL  30 mL Oral Q4H PRN [name], NP      . ARIPiprazole (ABILIFY) tablet 2 mg  2 mg Oral Daily [name], NP   2 mg at 07/06/16 2235  . FLUoxetine (PROZAC) capsule 10 mg  10 mg Oral Daily [name], MD   10 mg at 07/07/16 0741  . hydrOXYzine (ATARAX/VISTARIL) tablet 25 mg  25 mg Oral Q6H PRN [name], NP      . hydrOXYzine (ATARAX/VISTARIL) tablet 25 mg  25 mg Oral Q6H PRN [name], NP   25 mg at 07/06/16 2235  . magnesium hydroxide (MILK OF MAGNESIA) suspension 30 mL  30 mL Oral Daily PRN [name], NP      . magnesium hydroxide (MILK OF MAGNESIA) suspension 30 mL  30 mL Oral Daily PRN [name], NP      . traZODone (DESYREL) tablet 50 mg  50 mg Oral QHS PRN [name], NP   50 mg at 07/06/16 2235   PTA Medications: Prescriptions Prior to Admission  Medication Sig Dispense Refill Last Dose  . bisacodyl (DULCOLAX) 5 MG EC tablet Take 1 tablet (5 mg total) by mouth daily as needed for constipation. (Patient not taking: Reported on 07/04/2016) 30 tablet 0 Not Taking at Unknown time  . cyclobenzaprine (FLEXERIL) 10 MG tablet Take 1 tablet (10 mg total) by mouth 2 (two) times daily as needed for muscle spasms. (Patient not taking: Reported on 07/04/2016) 6 tablet 0 Not Taking at Unknown time  .  ibuprofen (ADVIL,MOTRIN) 600 MG tablet Take 1 tablet (600 mg total) by mouth every 8 (eight) hours as needed for pain or fever. (Patient not taking: Reported on 07/04/2016) 20 tablet 0 Not Taking at Unknown time  . magnesium hydroxide (MILK OF MAGNESIA) 400 MG/5ML suspension Take 30 mLs by mouth daily as needed for constipation. (Patient not taking: Reported on 07/04/2016) 360 mL 0 Not Taking at Unknown time    Patient Stressors: Financial difficulties Marital or family conflict  Patient Strengths: Physical Health Supportive family/friends  Treatment Modalities: Medication Management, Group therapy, Case management,  1 to 1 session with clinician, Psychoeducation, Recreational therapy.   Physician Treatment Plan for Primary Diagnosis: MDD (major depressive disorder), recurrent severe, without psychosis ([place]) Long Term Goal(s): Improvement in symptoms so as ready for discharge Improvement in symptoms so as ready for discharge   Short Term Goals: Ability to identify changes in lifestyle to reduce recurrence of condition will improve Ability to verbalize feelings will improve Ability to maintain clinical measurements within normal limits will improve Compliance with prescribed medications will improve Ability to verbalize feelings will improve Ability to disclose and discuss suicidal ideas Ability to identify and develop effective coping behaviors will improve  Medication Management: Evaluate patient's response, side effects, and tolerance of medication regimen.  Therapeutic Interventions: 1 to 1 sessions, Unit Group sessions and Medication administration.  Evaluation  of Outcomes: Not Met  Physician Treatment Plan for Secondary Diagnosis: Principal Problem:   MDD (major depressive disorder), recurrent severe, without psychosis ([name])  Long Term Goal(s): Improvement in symptoms so as ready for discharge Improvement in symptoms so as ready for discharge   Short Term Goals: Ability to  identify changes in lifestyle to reduce recurrence of condition will improve Ability to verbalize feelings will improve Ability to maintain clinical measurements within normal limits will improve Compliance with prescribed medications will improve Ability to verbalize feelings will improve Ability to disclose and discuss suicidal ideas Ability to identify and develop effective coping behaviors will improve     Medication Management: Evaluate patient's response, side effects, and tolerance of medication regimen.  Therapeutic Interventions: 1 to 1 sessions, Unit Group sessions and Medication administration.  Evaluation of Outcomes: Met   RN Treatment Plan for Primary Diagnosis: MDD (major depressive disorder), recurrent severe, without psychosis ([name]) Long Term Goal(s): Knowledge of disease and therapeutic regimen to maintain health will improve  Short Term Goals: Ability to participate in decision making will improve, Ability to identify and develop effective coping behaviors will improve and Compliance with prescribed medications will improve  Medication Management: RN will administer medications as ordered by provider, will assess and evaluate patient's response and provide education to patient for prescribed medication. RN will report any adverse and/or side effects to prescribing provider.  Therapeutic Interventions: 1 on 1 counseling sessions, Psychoeducation, Medication administration, Evaluate responses to treatment, Monitor vital signs and CBGs as ordered, Perform/monitor CIWA, COWS, AIMS and Fall Risk screenings as ordered, Perform wound care treatments as ordered.  Evaluation of Outcomes: Not Met   LCSW Treatment Plan for Primary Diagnosis: MDD (major depressive disorder), recurrent severe, without psychosis ([name]) Long Term Goal(s): Safe transition to appropriate next level of care at discharge, Engage patient in therapeutic group addressing interpersonal concerns.  Short Term  Goals: Engage patient in aftercare planning with referrals and resources, Increase ability to appropriately verbalize feelings, Increase emotional regulation, Facilitate patient progression through stages of change regarding substance use diagnoses and concerns and Identify triggers associated with mental health/substance abuse issues  Therapeutic Interventions: Assess for all discharge needs, 1 to 1 time with Social worker, Explore available resources and support systems, Assess for adequacy in community support network, Educate family and significant other(s) on suicide prevention, Complete Psychosocial Assessment, Interpersonal group therapy.  Evaluation of Outcomes: Not Met   Progress in Treatment: Attending groups: Yes. Participating in groups: Yes. Taking medication as prescribed: Yes. Toleration medication: Yes. Family/Significant other contact made: No, will contact:  CSW will contact collateral w patient consent Patient understands diagnosis: Yes. Discussing patient identified problems/goals with staff: Yes. Medical problems stabilized or resolved: Yes. Denies suicidal/homicidal ideation: Yes. and As evidenced by:  contracts for safety on unit Issues/concerns per patient self-inventory: No. Other: na  New problem(s) identified: No, Describe:  none at this time  New Short Term/Long Term Goal(s): CSW assessing  Discharge Plan or Barriers: CSW assessing  Reason for Continuation of Hospitalization: Anxiety Depression Medication stabilization Suicidal ideation  Estimated Length of Stay:  3 - 5 days  Attendees: Patient: 07/07/2016 12:24 PM  Physician: Germaine Pomfret MD 07/07/2016 12:24 PM  Nursing: Lenna Gilford RN 07/07/2016 12:24 PM  RN Care Manager: Addison Naegeli RN CM 07/07/2016 12:24 PM  Social Worker: Roxy Horseman LCSW 07/07/2016 12:24 PM  Recreational Therapist:  07/07/2016 12:24 PM  Other:  07/07/2016 12:24 PM  Other:  07/07/2016 12:24 PM  Other: 07/07/2016 12:24  PM    Scribe for  Treatment Team: Beverely Pace, Marlinda Mike 07/07/2016 12:24 PM
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
## 2016-07-07 NOTE — [facility] Group Notes (Signed)
[facility] LCSW Group Therapy  07/07/2016 1:30 to 2:30 PM  Type of Therapy:  Group Therapy Overcoming Obstacles   Participation Level:  None; patient left shortly after group began   Carney Bernatherine C Jasper Ruminski, LCSW

## 2016-07-07 NOTE — [facility] Group Notes (Signed)
Pt attended spiritual care group on grief and loss facilitated by chaplain Burnis KingfisherMatthew Aadya Kindler   Group opened with brief discussion and psycho-social ed around grief and loss in relationships and in relation to self - identifying life patterns, circumstances, changes that cause losses. Established group norm of speaking from own life experience. Group goal of establishing open and affirming space for members to share loss and experience with grief, normalize grief experience and provide psycho social education and grief support.   Jake CromeJaquan was present throughout group.  He was attentive to other group members.  Occasionally with his head in hands.  He did not contribute to group discussion.     Jake CromeStalnaker, Jake Neal MDiv

## 2016-07-07 NOTE — Plan of Care (Signed)
Problem: Activity: Goal: Interest or engagement in leisure activities will improve Outcome: Progressing Pt did attend evening AA group

## 2016-07-07 NOTE — Progress Notes (Signed)
[facility] Group Notes:  (Nursing/MHT/Case Management/Adjunct)  Date:  07/07/2016  Time:  10:24 PM  Type of Therapy:  Psychoeducational Skills  Participation Level:  Active  Participation Quality:  Appropriate  Affect:  Appropriate  Cognitive:  Appropriate  Insight:  Good  Engagement in Group:  Improving  Modes of Intervention:  Education  Summary of Progress/Problems: The patient verbalized in group that he had a bad start to his day, but talking to his peers helped him to cope. The patient also credited going to the gym to play basketball as a healthy coping skill. As for the theme of the day, his wellness strategy will be to work on getting better.   Hazle CocaGOODMAN, Brion Hedges S 07/07/2016, 10:24 PM

## 2016-07-07 NOTE — Progress Notes (Signed)
Patient ID: Jake Neal, male   DOB: 09/26/1990, 26 y.o.   MRN: [id number]  Pt currently presents with a flat affect and anxious behavior. Pt forwards little to Clinical research associatewriter, guarded. Pt reports good sleep with current medication regimen, falls asleep in the chair in the dayroom. Pt asks about medications and the reasons why he is taking each.   Pt provided with medications per providers orders. Pt's labs and vitals were monitored throughout the night. Pt given a 1:1 about emotional and mental status. Pt supported and encouraged to express concerns and questions. Pt educated on medications and their indications.  Pt's safety ensured with 15 minute and environmental checks. Pt currently denies SI/HI and A/V hallucinations. Pt verbally agrees to seek staff if SI/HI or A/VH occurs and to consult with staff before acting on any harmful thoughts. Will continue POC.

## 2016-07-07 NOTE — Progress Notes (Signed)
Patient ID: Charlann LangeJaquan A Kinker, male   DOB: 12/08/1990, 26 y.o.   MRN: [id number]  DAR: Pt. Denies SI/HI and Auditory Hallucinations. He does report some paranoia, stating that he feels like someone is looking in on him and watching him. He reports sleep is fair, appetite is fair, energy level is normal, and concentration is poor. He rates depression 8/10, hopelessness 9/10, and anxiety 7/10. Patient does not report any pain or discomfort at this time. Support and encouragement provided to the patient. Scheduled Prozac administered to patient this morning. Patient is minimal with Clinical research associatewriter but cooperative. He is seen in the milieu and is interacting with peers. He reported to Clinical research associatewriter before lunch that he was feeling "woozy" due to playing basketball in the gym and requested to stay back from the cafeteria. He was given a meal tray and Gatorade. Writer checked on patient shortly after and he reported feeling better. Q15 minute checks are maintained for safety.

## 2016-07-08 MED ORDER — FLUOXETINE HCL 20 MG PO CAPS
20.0000 mg | ORAL_CAPSULE | Freq: Every day | ORAL | Status: DC
  Administered 2016-07-09 – 2016-07-11 (×3): 20 mg via ORAL
  Filled 2016-07-08 (×2): qty 1
  Filled 2016-07-08: qty 7
  Filled 2016-07-08: qty 1
  Filled 2016-07-08: qty 7
  Filled 2016-07-08: qty 1

## 2016-07-08 NOTE — [facility] Group Notes (Signed)
[facility] Group Notes:  (Nursing/MHT/Case Management/Adjunct)  Date:  07/08/2016  Time:  0900  Type of Therapy:  Nurse Education  Participation Level:  Minimal  Participation Quality:  Attentive  Affect:  Anxious  Cognitive:  Oriented  Insight:  Limited  Engagement in Group:  Limited  Modes of Intervention:  Education  Summary of Progress/Problems: Patient attended group and was attentive.  Merian CapronFriedman, Cabella Kimm [facility] 07/08/2016, 0930

## 2016-07-08 NOTE — Progress Notes (Signed)
Recreation Therapy Notes  Animal-Assisted Activity (AAA) Program Checklist/Progress Notes Patient Eligibility Criteria Checklist & Daily Group note for Rec TxIntervention  Date: 02.13.2018 Time: 2:45pm [address]    AAA/T Program Assumption of Risk Form signed by Patient/ or Parent Legal Guardian Yes  Patient is free of allergies or sever asthma Yes  Patient reports no fear of animals Yes  Patient reports no history of cruelty to animals Yes  Patient understands his/her participation is voluntary Yes  Behavioral Response: Did not attend.   Marykay Lexenise L Henderson [place], LRT/CTRS       Alayiah Fontes L 07/08/2016 3:03 PM
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
## 2016-07-08 NOTE — [facility] Group Notes (Signed)
[facility] LCSW Group Therapy  07/08/2016 2:35 PM  Type of Therapy: Mental Health Association Presentation  Participation Level: Active  Participation Quality: Attentive  Affect: Appropriate  Cognitive: Oriented  Insight: Developing/Improving  Engagement in Therapy: Engaged  Modes of Intervention: Discussion, Education and Socialization  Summary of Progress/Problems: Mental Health Association (MHA) Speaker came to talk about his personal journey with substance abuse and addiction. The pt processed ways by which to relate to the speaker. MHA speaker provided handouts and educational information pertaining to groups and services offered by the [facility]. Pt was engaged in speaker's presentation and was receptive to resources provided.    Raye SorrowCoble, Shemeca Lukasik N 07/08/2016, 2:35 PM

## 2016-07-08 NOTE — Progress Notes (Signed)
D: Patient up and visible in the milieu. Stays in the dayroom with peers. Spoke with patient 1:1, reveals minimal information. Guarded and somewhat suspicious of this Clinical research associatewriter. Denies pain, physical problems.   A: Medicated per orders, no prns requested, required. Emotional support offered and self inventory completion encouraged. Discussed POC with MD, SW.    R: Patient verbalizes understanding of POC.  While patient endorses passive SI to MD, he denies to this Clinical research associatewriter. Does verbally contract for safety. Denies HI and remains safe on level III obs.

## 2016-07-08 NOTE — Plan of Care (Signed)
Problem: Activity: Goal: Imbalance in normal sleep/wake cycle will improve Outcome: Not Progressing Patient states he is having persistent nightmares which are disrupting sleep.  Problem: Safety: Goal: Periods of time without injury will increase Outcome: Progressing Patient has not engaged in self harm, denies SI.

## 2016-07-08 NOTE — Progress Notes (Signed)
Patient ID: Charlann LangeJaquan A Poyser, male   DOB: 11/02/1990, 26 y.o.   MRN: [id number]  Pt currently presents with a brighter affect and cooperative  behavior. Pt reports to writer "I feel much better today, I feel stronger." Pt states "but not strong enough to leave yet." Pt reports good sleep with current medication regimen. Pt reports that he still has some anxiety throughout the day. Surveyor, miningAsks writer multiple times about receiving medications for free post discharge. Reports he feels they are improving his mood and feels that staying on his medications will decrease his use of other substances.   Pt provided with medications per providers orders. Pt's labs and vitals were monitored throughout the night. Pt given a 1:1 about emotional and mental status. Pt supported and encouraged to express concerns and questions. Pt educated on medications and discharge planning.   Pt's safety ensured with 15 minute and environmental checks. Pt currently denies SI/HI and A/V hallucinations. States "I haven't had any SI today." Pt verbally agrees to seek staff if SI/HI or A/VH occurs and to consult with staff before acting on any harmful thoughts. Will continue POC.

## 2016-07-09 DIAGNOSIS — F1994 Other psychoactive substance use, unspecified with psychoactive substance-induced mood disorder: Secondary | ICD-10-CM

## 2016-07-09 MED ORDER — ARIPIPRAZOLE 10 MG PO TABS
10.0000 mg | ORAL_TABLET | Freq: Every day | ORAL | Status: DC
  Administered 2016-07-09 – 2016-07-10 (×2): 10 mg via ORAL
  Filled 2016-07-09 (×4): qty 1
  Filled 2016-07-09 (×2): qty 7

## 2016-07-09 MED ORDER — TRAZODONE HCL 50 MG PO TABS
50.0000 mg | ORAL_TABLET | Freq: Every day | ORAL | Status: DC
  Administered 2016-07-09 – 2016-07-10 (×2): 50 mg via ORAL
  Filled 2016-07-09: qty 1
  Filled 2016-07-09: qty 7
  Filled 2016-07-09: qty 1
  Filled 2016-07-09: qty 7
  Filled 2016-07-09 (×2): qty 1

## 2016-07-09 MED ORDER — FLUOXETINE HCL 10 MG PO CAPS
ORAL_CAPSULE | ORAL | Status: AC
  Filled 2016-07-09: qty 1

## 2016-07-09 NOTE — Progress Notes (Signed)
Recreation Therapy Notes  Date: 07/09/16 Time: 0930 Location: 300 [address] Dayroom  Group Topic: Stress Management  Goal Area(s) Addresses:  Patient will verbalize importance of using healthy stress management.  Patient will identify positive emotions associated with healthy stress management.   Behavioral Response: Engaged  Intervention: Stress Management  Activity :  Resilience Meditation.  LRT introduced the stress management technique of meditation.  LRT play the meditation from the Calm app to allow patients to engage in the activity.  Patients were to follow along as the meditation was played to fully engage in the technique.  Education:  Stress Management, Discharge Planning.   Education Outcome: Acknowledges edcuation/In group clarification offered/Needs additional education  Clinical Observations/Feedback: Pt attended group.   Caroll RancherMarjette Kaveon Blatz, LRT/CTRS         Caroll RancherLindsay, Sharilyn Geisinger A 07/09/2016 12:13 PM

## 2016-07-09 NOTE — Progress Notes (Signed)
Pt attended the evening NA speaker meeting. Pt was engaged and appropriate. Mylo Driskill C, NT 07/09/16 10:41 PM  

## 2016-07-09 NOTE — Plan of Care (Signed)
Problem: Activity: Goal: Sleeping patterns will improve Outcome: Progressing Pt slept 6.75 hrs last night.    
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
## 2016-07-09 NOTE — [facility] Group Notes (Signed)
[facility] LCSW Group Therapy  07/09/2016 11:56 AM  Type of Therapy:  Group Therapy  Participation Level:  Active  Participation Quality:  Attentive  Affect:  Appropriate  Cognitive:  Alert and Appropriate  Insight:  Developing/Improving  Engagement in Therapy:  Improving  Modes of Intervention:  Discussion and Exploration  Summary of Progress/Problems: The purpose of this group is to assist patients in learning to regulate negative emotions and experience positive emotions. Patients will be guided to discuss ways in which they have been vulnerable to their negative emotions. These vulnerabilities will be juxtaposed with experiences of positive emotions or situations, and patients challenged to use positive emotions to combat negative ones. Special emphasis will be placed on coping with negative emotions in conflict situations, and patients will process healthy conflict resolution skills.  Therapeutic Goals: 1. Patient will identify two positive emotions or experiences to reflect on in order to balance out negative emotions:  2. Patient will label two or more emotions that they find the most difficult to experience:  Patient will be able to demonstrate positive conflict resolution skills through discussion or role plays:   Belva CromeJaquan was able to process and give insight related to how he is learning to not let the past dictate his current situations and feelings.  He reports he just to rebel and want revenge when people did him wrong and would be impulsive, but he is learning to process and use coping skills to think before he responds and be more open when conflict arises.   Raye SorrowCoble, Tabithia Stroder N 07/09/2016, 11:56 AM

## 2016-07-09 NOTE — Progress Notes (Signed)
D: Patient up and visible in the milieu. Spoke with patient 1:1. Rates sleep as fair, appetite as poor, energy as low and concentration as good. Patient's affect guarded, flat with congruent mood. Rating depression at a 5/10, hopelessness at a 7/10 and anxiety at a 4/10. States goal for today is to "get better." Denies pain, physical problems.   A: Medicated per orders, no prns required, requested. Emotional support offered and self inventory reviewed. Discussed POC with MD, SW.     R: Patient verbalizes understanding of POC. Continues to forward minimal information. Patient denies SI/HI and remains safe on level III obs.

## 2016-07-10 DIAGNOSIS — F19950 Other psychoactive substance use, unspecified with psychoactive substance-induced psychotic disorder with delusions: Secondary | ICD-10-CM

## 2016-07-10 NOTE — [facility] Group Notes (Signed)
[facility] LCSW Group Therapy  07/10/2016 2:39 PM  Type of Therapy:  Group Therapy  Participation Level:  Minimal  Participation Quality:  Drowsy  Affect:  Depressed  Cognitive:  Alert and Appropriate  Insight:  Limited  Engagement in Therapy:  Limited  Modes of Intervention:  Discussion, Exploration and Limit-setting  Summary of Progress/Problems:  Group consisted of members processing balance in life.  Members were asked to reflect on areas of life that become unbalanced and how having purpose, boundaries, and support increase balance and stability.  Jake Neal came into group very late and was awake, but did not really engage in session.   Jake Neal, Jake Neal 07/10/2016, 2:39 PM
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
## 2016-07-10 NOTE — Progress Notes (Signed)
D: Pt presents with depressed affect and mood on initial contact. Brightened up on approach and forwards during conversations. Denies SI, HI, AVH and pain. Rates his depression 7/10, hopelessness 5/10 and anxiety 8/10. Report he's sleeping well with poor appetite, normal energy and poor concentration level. "I'm still having night mares even with the medications, it's about things that happened to me in the past, but it's there".  Minimal but appropriate interactions observed with peers and staff.  A: Emotional support and availability provided to pt. Encouraged pt to voice concerns, comply with current treatment regimen including unit groups. Assigned MD made aware of pt's concerns related to nightmares. All medications administered as prescribed. Safety check maintained on and off unit at Q 15 minutes intervals without incidents to note thus far.  R: Pt receptive to care. Compliant with medications when offered. Denies adverse drug reactions. Expressed his excitement related to d/c tomorrow. Declined PRN Vistaril when offered "not now, I'll ask later". POC continues for safety and mood stability.

## 2016-07-10 NOTE — Progress Notes (Signed)
  DATA ACTION RESPONSE  Objective- Pt. is up and visible in the milieu, interacting with peers. Pt. presents with a brigten affect and mood. Pt. is due to be d/c pending tomorrow.  Subjective- Denies having any SI/HI/AVH/Pain at this time. Pt. states " I will continue to take my medications when I get out".  Pt. continues to be cooperative and remain safe & pleasant on the unit.  1:1 interaction in private to establish rapport. Encouragement, education, & support given from staff. Meds. ordered and administered.    Safety maintained with Q 15 checks. Continues to follow treatment plan and will monitor closely. No additonal questions/concerns noted.

## 2016-07-10 NOTE — Progress Notes (Signed)
  DATA ACTION RESPONSE  Objective- Pt. is up and visible in the milieu, interacting with peers and playing cards. Pt appears to have a brighter affect and mood. Subjective- Denies having any SI/HI/AVH/Pain at this time. Pt. states "sleep is okay but I still have these nightmares". Pt. continues to be cooperative and remain safe & pleasant on the unit.  1:1 interaction in private to establish rapport. Encouragement & support given from staff.  Pt. received increase dose of Abilify this evening. Medication education given. Meds. ordered and administered.   Safety maintained with Q 15 checks. Continues to follow treatment plan and will monitor closely. No additonal questions/concerns noted.

## 2016-07-10 NOTE — Progress Notes (Signed)
[facility] Group Notes:  (Nursing/MHT/Case Management/Adjunct)  Date:  07/10/2016  Time:  10:06 PM  Type of Therapy:  Psychoeducational Skills  Participation Level:  Active  Participation Quality:  Attentive  Affect:  Flat  Cognitive:  Appropriate  Insight:  Limited  Engagement in Group:  Developing/Improving  Modes of Intervention:  Education  Summary of Progress/Problems: The patient shared with the group that he had a good day overall because he was able to be supportive of his peers. The patient's goal for tomorrow is to get discharged. He is unaware of his discharge plans at this time.   Shiryl Ruddy S 07/10/2016, 10:06 PM

## 2016-07-10 NOTE — Plan of Care (Signed)
Problem: Health Behavior/Discharge Planning: Goal: Compliance with therapeutic regimen will improve Outcome: Progressing Pt. attend all groups.

## 2016-07-11 MED ORDER — TRAZODONE HCL 50 MG PO TABS: 50.0000 mg | ORAL_TABLET | Freq: Every day | ORAL | 0 refills | Status: DC

## 2016-07-11 MED ORDER — HYDROXYZINE HCL 25 MG PO TABS: 25.0000 mg | ORAL_TABLET | Freq: Four times a day (QID) | ORAL | 0 refills | Status: DC | PRN

## 2016-07-11 MED ORDER — FLUOXETINE HCL 20 MG PO CAPS: 20.0000 mg | ORAL_CAPSULE | Freq: Every day | ORAL | 0 refills | Status: DC

## 2016-07-11 MED ORDER — ARIPIPRAZOLE 10 MG PO TABS: 10.0000 mg | ORAL_TABLET | Freq: Every day | ORAL | 0 refills | Status: DC

## 2016-07-11 MED ORDER — PRAZOSIN HCL 2 MG PO CAPS
2.0000 mg | ORAL_CAPSULE | Freq: Every day | ORAL | Status: DC
  Filled 2016-07-11: qty 7
  Filled 2016-07-11: qty 1
  Filled 2016-07-11: qty 7

## 2016-07-11 MED ORDER — PRAZOSIN HCL 2 MG PO CAPS: 2.0000 mg | ORAL_CAPSULE | Freq: Every day | ORAL | 0 refills | Status: DC

## 2016-07-11 NOTE — Progress Notes (Signed)
  [facility] :  Will you be returning to the same living situation after discharge:  Yes,  home At discharge, do you have transportation home?: Yes,  aunt or mother Do you have the ability to pay for your medications: Yes,  mental health  Release of information consent forms completed and submitted to medical records by CSW.  Patient to Follow up at: Follow-up Information    [facility].   Specialty:  Behavioral Health Why:  Please go within 1-3 days of discharge to be established for medication management and therapy. Walk-in hours are from 8am-3pm Monday-Friday. Contact information: [address] [place] [address] [phone]           Next level of care provider [facility] Link:no  Safety Planning and Suicide Prevention discussed: Yes,  SPE completed with pt; pt declined to consent to family contact. SPI pamphlet and Mobile Crisis information provided.  Have you used any form of tobacco in the last 30 days? (Cigarettes, Smokeless Tobacco, Cigars, and/or Pipes): No  Has patient been referred to the Quitline?: N/A patient is not a smoker  Patient has been referred for addiction treatment: Yes  Pulte HomesHeather N Smart LCSW 07/11/2016, 10:52 AM

## 2016-07-11 NOTE — Progress Notes (Signed)
Adult Psychoeducational Group Note  Date:  07/11/2016 Time:  11:03 AM  Group Topic/Focus:  Relapse Prevention Planning:   The focus of this group is to define relapse and discuss the need for planning to combat relapse.  Participation Level:  Minimal  Participation Quality:  Appropriate, Attentive and Did not share  Affect:  Appropriate  Cognitive:  Appropriate  Insight: None  Engagement in Group:  None  Modes of Intervention:  Discussion, Education, Limit-setting, Orientation, Socialization and Support  Additional Comments:  Pt attended group on relapse prevention and recognizing mental health issues and triggers. Discussion of how to overcome relapse and ways to utilize time and management becoming healthier. Pt did shared one thing in group about his day and he woke up. Pt did not engage in group and did not share about the subject of relapse.  Karleen HampshireFox, Nayali Talerico Brittini 07/11/2016, 11:03 AM

## 2016-07-11 NOTE — Tx Team (Signed)
Interdisciplinary Treatment and Diagnostic Plan Update  07/11/2016 Time of Session: 9:30 AM Jake Neal MRN: [id number]  Principal Diagnosis: Substance-induced psychotic disorder with delusions (HCC)  Secondary Diagnoses: Principal Problem:   Substance-induced psychotic disorder with delusions (HCC) Active Problems:   Major depressive disorder, single episode, severe with psychotic features (HCC)   Current Medications:  Current Facility-Administered Medications  Medication Dose Route Frequency Provider Last Rate Last Dose  . acetaminophen (TYLENOL) tablet 650 mg  650 mg Oral Q6H PRN Laveda Abbe, NP      . alum & mag hydroxide-simeth (MAALOX/MYLANTA) 200-200-20 MG/5ML suspension 30 mL  30 mL Oral Q4H PRN Laveda Abbe, NP      . ARIPiprazole (ABILIFY) tablet 10 mg  10 mg Oral Daily Georgiann Cocker, MD   10 mg at 07/10/16 2106  . FLUoxetine (PROZAC) capsule 20 mg  20 mg Oral Daily Burnard Leigh, MD   20 mg at 07/11/16 0836  . hydrOXYzine (ATARAX/VISTARIL) tablet 25 mg  25 mg Oral Q6H PRN Laveda Abbe, NP   25 mg at 07/06/16 2235  . magnesium hydroxide (MILK OF MAGNESIA) suspension 30 mL  30 mL Oral Daily PRN Laveda Abbe, NP      . prazosin (MINIPRESS) capsule 2 mg  2 mg Oral QHS Georgiann Cocker, MD      . traZODone (DESYREL) tablet 50 mg  50 mg Oral QHS Georgiann Cocker, MD   50 mg at 07/10/16 2300   PTA Medications: Prescriptions Prior to Admission  Medication Sig Dispense Refill Last Dose  . bisacodyl (DULCOLAX) 5 MG EC tablet Take 1 tablet (5 mg total) by mouth daily as needed for constipation. (Patient not taking: Reported on 07/04/2016) 30 tablet 0 Not Taking at Unknown time  . cyclobenzaprine (FLEXERIL) 10 MG tablet Take 1 tablet (10 mg total) by mouth 2 (two) times daily as needed for muscle spasms. (Patient not taking: Reported on 07/04/2016) 6 tablet 0 Not Taking at Unknown time  . ibuprofen (ADVIL,MOTRIN) 600 MG tablet Take 1 tablet  (600 mg total) by mouth every 8 (eight) hours as needed for pain or fever. (Patient not taking: Reported on 07/04/2016) 20 tablet 0 Not Taking at Unknown time  . magnesium hydroxide (MILK OF MAGNESIA) 400 MG/5ML suspension Take 30 mLs by mouth daily as needed for constipation. (Patient not taking: Reported on 07/04/2016) 360 mL 0 Not Taking at Unknown time    Patient Stressors: Financial difficulties Marital or family conflict  Patient Strengths: Physical Health Supportive family/friends  Treatment Modalities: Medication Management, Group therapy, Case management,  1 to 1 session with clinician, Psychoeducation, Recreational therapy.   Physician Treatment Plan for Primary Diagnosis: Substance-induced psychotic disorder with delusions (HCC) Long Term Goal(s): Improvement in symptoms so as ready for discharge Improvement in symptoms so as ready for discharge   Short Term Goals: Ability to identify changes in lifestyle to reduce recurrence of condition will improve Ability to verbalize feelings will improve Ability to maintain clinical measurements within normal limits will improve Compliance with prescribed medications will improve Ability to verbalize feelings will improve Ability to disclose and discuss suicidal ideas Ability to identify and develop effective coping behaviors will improve  Medication Management: Evaluate patient's response, side effects, and tolerance of medication regimen.  Therapeutic Interventions: 1 to 1 sessions, Unit Group sessions and Medication administration.  Evaluation of Outcomes: Met  Physician Treatment Plan for Secondary Diagnosis: Principal Problem:   Substance-induced psychotic disorder with delusions (HCC) Active  Problems:   Major depressive disorder, single episode, severe with psychotic features ([place])  Long Term Goal(s): Improvement in symptoms so as ready for discharge Improvement in symptoms so as ready for discharge   Short Term Goals:  Ability to identify changes in lifestyle to reduce recurrence of condition will improve Ability to verbalize feelings will improve Ability to maintain clinical measurements within normal limits will improve Compliance with prescribed medications will improve Ability to verbalize feelings will improve Ability to disclose and discuss suicidal ideas Ability to identify and develop effective coping behaviors will improve     Medication Management: Evaluate patient's response, side effects, and tolerance of medication regimen.  Therapeutic Interventions: 1 to 1 sessions, Unit Group sessions and Medication administration.  Evaluation of Outcomes: Met   RN Treatment Plan for Primary Diagnosis: Substance-induced psychotic disorder with delusions ([place]) Long Term Goal(s): Knowledge of disease and therapeutic regimen to maintain health will improve  Short Term Goals: Ability to participate in decision making will improve, Ability to identify and develop effective coping behaviors will improve and Compliance with prescribed medications will improve  Medication Management: RN will administer medications as ordered by provider, will assess and evaluate patient's response and provide education to patient for prescribed medication. RN will report any adverse and/or side effects to prescribing provider.  Therapeutic Interventions: 1 on 1 counseling sessions, Psychoeducation, Medication administration, Evaluate responses to treatment, Monitor vital signs and CBGs as ordered, Perform/monitor CIWA, COWS, AIMS and Fall Risk screenings as ordered, Perform wound care treatments as ordered.  Evaluation of Outcomes: Met   LCSW Treatment Plan for Primary Diagnosis: Substance-induced psychotic disorder with delusions ([place]) Long Term Goal(s): Safe transition to appropriate next level of care at discharge, Engage patient in therapeutic group addressing interpersonal concerns.  Short Term Goals: Engage patient in  aftercare planning with referrals and resources, Increase ability to appropriately verbalize feelings, Increase emotional regulation, Facilitate patient progression through stages of change regarding substance use diagnoses and concerns and Identify triggers associated with mental health/substance abuse issues  Therapeutic Interventions: Assess for all discharge needs, 1 to 1 time with Social worker, Explore available resources and support systems, Assess for adequacy in community support network, Educate family and significant other(s) on suicide prevention, Complete Psychosocial Assessment, Interpersonal group therapy.  Evaluation of Outcomes: Met   Progress in Treatment: Attending groups: Yes. Participating in groups: Yes. Taking medication as prescribed: Yes. Toleration medication: Yes. Family/Significant other contact made: No, will contact:  CSW will contact collateral w patient consent Patient understands diagnosis: Yes. Discussing patient identified problems/goals with staff: Yes. Medical problems stabilized or resolved: Yes. Denies suicidal/homicidal ideation: Yes. and As evidenced by:  contracts for safety on unit Issues/concerns per patient self-inventory: No. Other: na  New problem(s) identified: No, Describe:  none at this time  New Short Term/Long Term Goal(s): detox; medication stabilization; development of comprehensive mental wellness/sobriety plan.   Discharge Plan or Barriers: Pt plans to return home; follow-up at [facility]. Pt provided with AA/NA lists and mental health association information.    Reason for Continuation of Hospitalization: none  Estimated Length of Stay:  3 - 5 days  Attendees: Patient: 07/11/2016 10:58 AM  Physician: Dr. Sanjuana Letters MD 07/11/2016 10:58 AM  Nursing: Nicoletta Dress; Rise Paganini RN 07/11/2016 10:58 AM  RN Care Manager: Addison Naegeli RN CM 07/11/2016 10:58 AM  Social Worker: Roxy Horseman LCSW'; [address] LCSW  07/11/2016 10:58 AM  Recreational  Therapist: Rhunette Croft 07/11/2016 10:58 AM  Other: Ricky Ala NP; Lindell Spar NP 07/11/2016 10:58  AM  Other:  07/11/2016 10:58 AM  Other: 07/11/2016 10:58 AM    Scribe for Treatment Team: [place], LCSW 07/11/2016 10:58 AM
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
## 2016-07-11 NOTE — Progress Notes (Signed)
D: Pt presents with depressed affect and anxious mood on initial contact. Brightened up on approach and forwards during conversations. Denies SI, HI, AVH and pain. Expressed being paranoid "I still feel like someone is watching over me when I'm sleeping, I mean I'm fine during the day, only at night". Pt d/c as ordered and was picked up in the main lobby by his mother.  A: Emotional support and availability provided to pt. D/C instructions reviewed with pt including medication samples, prescriptions and [facility]. Encouraged pt to comply with d/c instructions as outlined. All belongings in locker 10 returned to pt prior to d/c.Q 15 minutes safety checks maintained on and off unit till time of d/c from facility.  R: Pt receptive to care. Took his scheduled medication without issues. Cooperative with d/c procedure. Verbalized understanding related to d/c instructions. Pt signed belonging sheet in agreement with items received. Ambulatory with a steady gait. Appears to be in no physical distress at time of d/c.

## 2016-07-11 NOTE — [facility] Suicide Risk Assessment (Signed)
[facility] INPATIENT:  Family/Significant Other Suicide Prevention Education  Suicide Prevention Education:  Patient Refusal for Family/Significant Other Suicide Prevention Education: The patient Jake Neal has refused to provide written consent for family/significant other to be provided Family/Significant Other Suicide Prevention Education during admission and/or prior to discharge.  Physician notified.  SPE completed with pt, as pt refused to consent to family contact. SPI pamphlet provided to pt and pt was encouraged to share information with support network, ask questions, and talk about any concerns relating to SPE. Pt denies access to guns/firearms and verbalized understanding of information provided. Mobile Crisis information also provided to pt.   Jaevian Shean N Smart LCSW 07/11/2016, 10:52 AM

## 2016-07-11 NOTE — Progress Notes (Signed)
Recreation Therapy Notes  Date: 07/11/16 Time: 0930 Location: 300 Hall Dayroom  Group Topic: Coping Skills  Goal Area(s) Addresses:  Patients will be able to identify positive stress management techniques. Patients will be able to identify the benefits of stress management. Patients will be able to identify benefits of using stress management post d/c.  Behavioral Response: Engaged  Intervention: Stress Management  Activity: Progressive Muscle Relaxation.  LRT introduced the stress management technique of progressive muscle relaxation.  LRT read a script to lead the patients through the technique to tense and relax each muscle group individually.  Patients were to follow along as the script was read to engage in the activity,   Education: Coping Skills, Discharge Planning.   Education Outcome: Acknowledges understanding/In group clarification offered/Needs additional education.   Clinical Observations/Feedback: Pt attended group.    Caroll RancherMarjette Paxtyn Wisdom, LRT/CTRS    Lillia AbedLindsay, Zacharee Gaddie A 07/11/2016 11:29 AM

## 2018-07-31 ENCOUNTER — Emergency Department (HOSPITAL_COMMUNITY): Payer: Self-pay

## 2018-07-31 ENCOUNTER — Other Ambulatory Visit: Payer: Self-pay

## 2018-07-31 ENCOUNTER — Emergency Department (HOSPITAL_COMMUNITY)
Admission: EM | Admit: 2018-07-31 | Discharge: 2018-08-02 | Disposition: A | Discharge status: Other Acute Inpatient Hospital | Payer: Self-pay | Attending: Emergency Medicine | Admitting: Emergency Medicine

## 2018-07-31 DIAGNOSIS — S6990XA Unspecified injury of unspecified wrist, hand and finger(s), initial encounter: Secondary | ICD-10-CM

## 2018-07-31 DIAGNOSIS — F331 Major depressive disorder, recurrent, moderate: Secondary | ICD-10-CM | POA: Diagnosis present

## 2018-07-31 DIAGNOSIS — Y929 Unspecified place or not applicable: Secondary | ICD-10-CM | POA: Insufficient documentation

## 2018-07-31 DIAGNOSIS — F333 Major depressive disorder, recurrent, severe with psychotic symptoms: Secondary | ICD-10-CM

## 2018-07-31 DIAGNOSIS — Y999 Unspecified external cause status: Secondary | ICD-10-CM | POA: Insufficient documentation

## 2018-07-31 DIAGNOSIS — Y939 Activity, unspecified: Secondary | ICD-10-CM | POA: Insufficient documentation

## 2018-07-31 DIAGNOSIS — F323 Major depressive disorder, single episode, severe with psychotic features: Secondary | ICD-10-CM

## 2018-07-31 DIAGNOSIS — W2209XA Striking against other stationary object, initial encounter: Secondary | ICD-10-CM | POA: Insufficient documentation

## 2018-07-31 DIAGNOSIS — Z23 Encounter for immunization: Secondary | ICD-10-CM | POA: Insufficient documentation

## 2018-07-31 DIAGNOSIS — S61213A Laceration without foreign body of left middle finger without damage to nail, initial encounter: Secondary | ICD-10-CM

## 2018-07-31 LAB — CBC WITH DIFFERENTIAL/PLATELET
Abs Immature Granulocytes: 0 10*3/uL (ref 0.00–0.07)
Basophils Absolute: 0 10*3/uL (ref 0.0–0.1)
Basophils Relative: 1 %
Eosinophils Absolute: 0 10*3/uL (ref 0.0–0.5)
Eosinophils Relative: 1 %
HCT: 42.9 % (ref 39.0–52.0)
Hemoglobin: 14 g/dL (ref 13.0–17.0)
Immature Granulocytes: 0 %
Lymphocytes Relative: 39 %
Lymphs Abs: 1.4 10*3/uL (ref 0.7–4.0)
MCH: 31.7 pg (ref 26.0–34.0)
MCHC: 32.6 g/dL (ref 30.0–36.0)
MCV: 97.1 fL (ref 80.0–100.0)
Monocytes Absolute: 0.4 10*3/uL (ref 0.1–1.0)
Monocytes Relative: 11 %
Neutro Abs: 1.8 10*3/uL (ref 1.7–7.7)
Neutrophils Relative %: 48 %
Platelets: 189 10*3/uL (ref 150–400)
RBC: 4.42 MIL/uL (ref 4.22–5.81)
RDW: 12.6 % (ref 11.5–15.5)
WBC: 3.7 10*3/uL — ABNORMAL LOW (ref 4.0–10.5)
nRBC: 0 % (ref 0.0–0.2)

## 2018-07-31 LAB — COMPREHENSIVE METABOLIC PANEL
ALK PHOS: 45 U/L (ref 38–126)
ALT: 15 U/L (ref 0–44)
AST: 16 U/L (ref 15–41)
Albumin: 4 g/dL (ref 3.5–5.0)
Anion gap: 4 — ABNORMAL LOW (ref 5–15)
BILIRUBIN TOTAL: 1.4 mg/dL — AB (ref 0.3–1.2)
BUN: 9 mg/dL (ref 6–20)
CALCIUM: 9.2 mg/dL (ref 8.9–10.3)
CO2: 27 mmol/L (ref 22–32)
Chloride: 110 mmol/L (ref 98–111)
Creatinine, Ser: 0.97 mg/dL (ref 0.61–1.24)
Glucose, Bld: 86 mg/dL (ref 70–99)
Potassium: 4.1 mmol/L (ref 3.5–5.1)
Sodium: 141 mmol/L (ref 135–145)
Total Protein: 6.5 g/dL (ref 6.5–8.1)

## 2018-07-31 LAB — SALICYLATE LEVEL: Salicylate Lvl: <7 mg/dL (ref 2.8–30.0)

## 2018-07-31 LAB — ETHANOL: Alcohol, Ethyl (B): <10 mg/dL (ref <10)

## 2018-07-31 LAB — ACETAMINOPHEN LEVEL: Acetaminophen (Tylenol), Serum: <10 ug/mL — ABNORMAL LOW (ref 10–30)

## 2018-07-31 MED ORDER — STERILE WATER FOR INJECTION IJ SOLN
INTRAMUSCULAR | Status: AC
  Administered 2018-07-31: 10 mL
  Filled 2018-07-31: qty 10

## 2018-07-31 MED ORDER — ZIPRASIDONE MESYLATE 20 MG IM SOLR
10.0000 mg | Freq: Once | INTRAMUSCULAR | Status: AC
  Administered 2018-07-31: 10 mg via INTRAMUSCULAR
  Filled 2018-07-31: qty 20

## 2018-07-31 MED ORDER — TETANUS-DIPHTH-ACELL PERTUSSIS 5-2.5-18.5 LF-MCG/0.5 IM SUSP
0.5000 mL | Freq: Once | INTRAMUSCULAR | Status: AC
  Administered 2018-07-31: 0.5 mL via INTRAMUSCULAR
  Filled 2018-07-31: qty 0.5

## 2018-07-31 MED ORDER — LORAZEPAM 2 MG/ML IJ SOLN: 2.0000 mg | Freq: Once | INTRAMUSCULAR | Status: DC

## 2018-07-31 MED ORDER — LORAZEPAM 2 MG/ML IJ SOLN
2.0000 mg | Freq: Once | INTRAMUSCULAR | Status: AC
  Administered 2018-07-31: 2 mg via INTRAMUSCULAR
  Filled 2018-07-31: qty 1

## 2018-07-31 MED ORDER — DIPHENHYDRAMINE HCL 50 MG/ML IJ SOLN: 50.0000 mg | Freq: Once | INTRAMUSCULAR | Status: DC

## 2018-07-31 MED ORDER — DIPHENHYDRAMINE HCL 50 MG/ML IJ SOLN
50.0000 mg | Freq: Once | INTRAMUSCULAR | Status: AC
  Administered 2018-07-31: 50 mg via INTRAMUSCULAR
  Filled 2018-07-31: qty 1

## 2018-07-31 NOTE — ED Notes (Signed)
Report on Situation, Background, Assessment, and Recommendations received from [place], [place]. Patient transferred to acute care unit. Patient lethargic and appears slightly sedated, but no visible distress noted. Patient partially compliant with questions, but denies SI, HI, AVH and pain. Pt. Mood presents dysphoric. Patient made aware of Q15 minute rounds and security cameras for their safety. Patient instructed to come to me with needs or concerns. Pt. Given orientation of the unit and room. Pt. Provided with UA sampling cup and instructed to provide sample when able to. Pt. Given fluids to drink.

## 2018-07-31 NOTE — Progress Notes (Signed)
Patient transferred to acute care unit by staff.
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
## 2018-07-31 NOTE — BH Assessment (Signed)
[facility] Assessment Progress Note Case was staffed with Lord DNP who recommended a inpatient admission to assist with stabilization.       
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
## 2018-07-31 NOTE — ED Notes (Signed)
Patient observed asleep in his room, not willing to be complaint with allowing this writer to take his vital signs. Will continue to monitor and encourage compliance with treatment and MD orders.

## 2018-08-01 ENCOUNTER — Other Ambulatory Visit: Payer: Self-pay

## 2018-08-01 DIAGNOSIS — F323 Major depressive disorder, single episode, severe with psychotic features: Secondary | ICD-10-CM

## 2018-08-01 LAB — RAPID URINE DRUG SCREEN, HOSP PERFORMED
Amphetamines: NOT DETECTED
BARBITURATES: NOT DETECTED
Benzodiazepines: POSITIVE — AB
Cocaine: NOT DETECTED
Opiates: NOT DETECTED
Tetrahydrocannabinol: NOT DETECTED

## 2018-08-01 MED ORDER — TRAZODONE HCL 100 MG PO TABS
100.0000 mg | ORAL_TABLET | Freq: Every evening | ORAL | Status: DC | PRN
  Filled 2018-08-01: qty 1

## 2018-08-01 MED ORDER — HALOPERIDOL 2 MG PO TABS
2.0000 mg | ORAL_TABLET | Freq: Two times a day (BID) | ORAL | Status: DC
  Administered 2018-08-01 – 2018-08-02 (×2): 2 mg via ORAL
  Filled 2018-08-01 (×4): qty 1

## 2018-08-01 MED ORDER — FLUOXETINE HCL 20 MG PO CAPS
20.0000 mg | ORAL_CAPSULE | Freq: Every day | ORAL | Status: DC
  Administered 2018-08-01 – 2018-08-02 (×2): 20 mg via ORAL
  Filled 2018-08-01 (×2): qty 1

## 2018-08-01 MED ORDER — HYDROXYZINE HCL 25 MG PO TABS: 25.0000 mg | ORAL_TABLET | Freq: Four times a day (QID) | ORAL | Status: DC | PRN

## 2018-08-01 NOTE — ED Notes (Signed)
Report received, pt sleeping.
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
## 2018-08-01 NOTE — ED Notes (Signed)
PT REFUSED NIGHT TIME MEDS. 

## 2018-08-01 NOTE — Progress Notes (Addendum)
This patient continues to meet inpatient criteria. CSW fax information to the following facilities:   [facility] does not have an appropriate bed for patient at this time.  [facility] [place]- DECLINED [place] [facility]- DENIED, no beds [facility] [place]  Enid Cutter, [place] Clinical Social Worker [phone]

## 2018-08-01 NOTE — ED Notes (Signed)
Urine collected did not have a patient's label, therefore, he is aware we need more urine.

## 2018-08-01 NOTE — ED Notes (Signed)
Pt A&O x 3, resting at present, no distress noted, calm at present.  Remains guarded and paranoid.  Monitoring for safety, Q 15 min checks in effect.

## 2018-08-01 NOTE — Progress Notes (Signed)
Patient compliant with vital signs this morning, but not able to provide urine sample yet. Pt. Encouraged to comply with required testings.

## 2018-08-01 NOTE — ED Notes (Signed)
Pt  was provided breakfast, lunch and dinner patient did not eat any meals pts nurse Wille Celeste was notified

## 2018-08-01 NOTE — ED Notes (Signed)
Nursing staff asked patient to provide nursing saff with a urine sample he has refused several times pt's nurse leslie was notified.

## 2018-08-01 NOTE — ED Notes (Signed)
Pt has only been drinking unopened juice this afternoon--x2 containers

## 2018-08-02 ENCOUNTER — Inpatient Hospital Stay (HOSPITAL_COMMUNITY)
Admission: AD | Admit: 2018-08-02 | Discharge: 2018-08-06 | DRG: 881 | Disposition: A | Discharge status: Home / Self Care | Payer: Federal, State, Local not specified - Other | Source: Intra-hospital | Attending: Psychiatry | Admitting: Psychiatry

## 2018-08-02 ENCOUNTER — Encounter (HOSPITAL_COMMUNITY): Payer: Self-pay

## 2018-08-02 DIAGNOSIS — Z79899 Other long term (current) drug therapy: Secondary | ICD-10-CM

## 2018-08-02 DIAGNOSIS — F29 Unspecified psychosis not due to a substance or known physiological condition: Secondary | ICD-10-CM | POA: Diagnosis present

## 2018-08-02 DIAGNOSIS — F19950 Other psychoactive substance use, unspecified with psychoactive substance-induced psychotic disorder with delusions: Secondary | ICD-10-CM | POA: Diagnosis present

## 2018-08-02 DIAGNOSIS — Z87891 Personal history of nicotine dependence: Secondary | ICD-10-CM | POA: Diagnosis not present

## 2018-08-02 DIAGNOSIS — F19959 Other psychoactive substance use, unspecified with psychoactive substance-induced psychotic disorder, unspecified: Secondary | ICD-10-CM

## 2018-08-02 DIAGNOSIS — F329 Major depressive disorder, single episode, unspecified: Principal | ICD-10-CM | POA: Diagnosis present

## 2018-08-02 DIAGNOSIS — F331 Major depressive disorder, recurrent, moderate: Secondary | ICD-10-CM | POA: Diagnosis present

## 2018-08-02 DIAGNOSIS — F333 Major depressive disorder, recurrent, severe with psychotic symptoms: Secondary | ICD-10-CM

## 2018-08-02 DIAGNOSIS — F1290 Cannabis use, unspecified, uncomplicated: Secondary | ICD-10-CM | POA: Diagnosis present

## 2018-08-02 MED ORDER — HALOPERIDOL 2 MG PO TABS
2.0000 mg | ORAL_TABLET | Freq: Two times a day (BID) | ORAL | Status: DC
  Administered 2018-08-03: 2 mg via ORAL
  Filled 2018-08-02 (×4): qty 1

## 2018-08-02 MED ORDER — FLUOXETINE HCL 20 MG PO CAPS
20.0000 mg | ORAL_CAPSULE | Freq: Every day | ORAL | Status: DC
  Administered 2018-08-03 – 2018-08-06 (×4): 20 mg via ORAL
  Filled 2018-08-02: qty 7
  Filled 2018-08-02: qty 1
  Filled 2018-08-02: qty 7
  Filled 2018-08-02 (×4): qty 1
  Filled 2018-08-02: qty 7

## 2018-08-02 MED ORDER — BENZTROPINE MESYLATE 0.5 MG PO TABS
0.5000 mg | ORAL_TABLET | Freq: Every day | ORAL | Status: DC
  Administered 2018-08-03 – 2018-08-06 (×4): 0.5 mg via ORAL
  Filled 2018-08-02: qty 1
  Filled 2018-08-02 (×2): qty 7
  Filled 2018-08-02 (×3): qty 1
  Filled 2018-08-02: qty 7
  Filled 2018-08-02 (×2): qty 1

## 2018-08-02 MED ORDER — HYDROXYZINE HCL 25 MG PO TABS
25.0000 mg | ORAL_TABLET | Freq: Four times a day (QID) | ORAL | Status: DC | PRN
  Administered 2018-08-02 – 2018-08-04 (×2): 25 mg via ORAL
  Filled 2018-08-02: qty 10
  Filled 2018-08-02 (×3): qty 1

## 2018-08-02 MED ORDER — ACETAMINOPHEN 325 MG PO TABS: 650.0000 mg | ORAL_TABLET | Freq: Four times a day (QID) | ORAL | Status: DC | PRN

## 2018-08-02 MED ORDER — ALUM & MAG HYDROXIDE-SIMETH 200-200-20 MG/5ML PO SUSP: 30.0000 mL | ORAL | Status: DC | PRN

## 2018-08-02 MED ORDER — MAGNESIUM HYDROXIDE 400 MG/5ML PO SUSP: 30.0000 mL | Freq: Every day | ORAL | Status: DC | PRN

## 2018-08-02 MED ORDER — TRAZODONE HCL 50 MG PO TABS
50.0000 mg | ORAL_TABLET | Freq: Every evening | ORAL | Status: DC | PRN
  Administered 2018-08-02: 50 mg via ORAL
  Filled 2018-08-02: qty 1

## 2018-08-02 NOTE — Consult Note (Addendum)
[facility] Psych ED Progress Note  08/02/2018 11:57 AM Jake Neal  MRN:  [id number] Subjective:   Jake Neal reports that he is doing "all right" today. He slept well overnight. He did not eat breakfast today. He reports a poor appetite. After reviewing medical records, he has been paranoid and guarded. He will only drink unopened juice. He denies SI, HI or AVH. He reports that he is admitted to the hospital due to hand pain. IVC paperwork states that he is tired of people and does not care if he lives anymore. He believes that someone is watching and following him. He has been aggressive to family.   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 Medical history non-contributory    No past surgical history on file. Family History: No family history on file. Family Psychiatric  History: None per chart review.  Social History:  Social History   Substance and Sexual Activity  Alcohol Use No     Social History   Substance and Sexual Activity  Drug Use Yes  . Types: Marijuana    Social History   Socioeconomic History  . Marital status: Single    Spouse name: Not on file  . Number of children: Not on file  . Years of education: Not on file  . Highest education level: Not on file  Occupational History  . Not on file  Social Needs  . Financial resource strain: Not on file  . Food insecurity:    Worry: Not on file    Inability: Not on file  . Transportation needs:    Medical: Not on file    Non-medical: Not on file  Tobacco Use  . Smoking status: Never  Smoker  . Smokeless tobacco: Never Used  Substance and Sexual Activity  . Alcohol use: No  . Drug use: Yes    Types: Marijuana  . Sexual activity: Yes    Birth control/protection: None  Lifestyle  . Physical activity:    Days per week: Not on file    Minutes per session: Not on file  . Stress: Not on file  Relationships  . Social connections:    Talks on phone: Not on file    Gets together: Not on file    Attends religious service: Not on file    Active member of club or organization: Not on file    Attends meetings of clubs or organizations: Not on file    Relationship status: Not on file  Other Topics Concern  . Not on file  Social History Narrative  . Not on file    Sleep: Good  Appetite:  Poor  Current Medications: Current Facility-Administered Medications  Medication Dose Route Frequency Provider Last Rate Last Dose  . FLUoxetine (PROZAC) capsule 20 mg  20 mg Oral Daily Akintayo, Mojeed, MD   20 mg at 08/02/18 0948  . haloperidol (HALDOL) tablet 2 mg  2 mg Oral BID Thedore Mins, MD   2 mg at 08/02/18 0947  . hydrOXYzine (ATARAX/VISTARIL) tablet 25 mg  25 mg Oral Q6H PRN Thedore Mins, MD      . traZODone (DESYREL)  tablet 100 mg  100 mg Oral QHS PRN Thedore Mins, MD       Current Outpatient Medications  Medication Sig Dispense Refill  . ARIPiprazole (ABILIFY) 10 MG tablet Take 1 tablet (10 mg total) by mouth daily. (Patient not taking: Reported on 07/31/2018) 30 tablet 0  . FLUoxetine (PROZAC) 20 MG capsule Take 1 capsule (20 mg total) by mouth daily. (Patient not taking: Reported on 07/31/2018) 30 capsule 0  . hydrOXYzine (ATARAX/VISTARIL) 25 MG tablet Take 1 tablet (25 mg total) by mouth every 6 (six) hours as needed for anxiety. (Patient not taking: Reported on 07/31/2018) 30 tablet 0  . prazosin (MINIPRESS) 2 MG capsule Take 1 capsule (2 mg total) by mouth at bedtime. (Patient not taking: Reported on 07/31/2018) 30 capsule 0  . traZODone (DESYREL) 50 MG tablet  Take 1 tablet (50 mg total) by mouth at bedtime. (Patient not taking: Reported on 07/31/2018) 30 tablet 0    Lab Results:  Results for orders placed or performed during the hospital encounter of 07/31/18 (from the past 48 hour(s))  Comprehensive metabolic panel     Status: Abnormal   Collection Time: 07/31/18  4:05 PM  Result Value Ref Range   Sodium 141 135 - 145 mmol/L   Potassium 4.1 3.5 - 5.1 mmol/L   Chloride 110 98 - 111 mmol/L   CO2 27 22 - 32 mmol/L   Glucose, Bld 86 70 - 99 mg/dL   BUN 9 6 - 20 mg/dL   Creatinine, Ser 1.61 0.61 - 1.24 mg/dL   Calcium 9.2 8.9 - 09.6 mg/dL   Total Protein 6.5 6.5 - 8.1 g/dL   Albumin 4.0 3.5 - 5.0 g/dL   AST 16 15 - 41 U/L   ALT 15 0 - 44 U/L   Alkaline Phosphatase 45 38 - 126 U/L   Total Bilirubin 1.4 (H) 0.3 - 1.2 mg/dL   GFR calc non Af Amer >60 >60 mL/min   GFR calc Af Amer >60 >60 mL/min   Anion gap 4 (L) 5 - 15    Comment: Performed at [facility], [address] [place], [place] [address]  Ethanol     Status: None   Collection Time: 07/31/18  4:05 PM  Result Value Ref Range   Alcohol, Ethyl (B) <10 <10 mg/dL    Comment: (NOTE) Lowest detectable limit for serum alcohol is 10 mg/dL. For medical purposes only. Performed at [facility], [address], [place], [place] [address]   Urine rapid drug screen (hosp performed)     Status: Abnormal   Collection Time: 07/31/18  4:05 PM  Result Value Ref Range   Opiates NONE DETECTED NONE DETECTED   Cocaine NONE DETECTED NONE DETECTED   Benzodiazepines POSITIVE (A) NONE DETECTED   Amphetamines NONE DETECTED NONE DETECTED   Tetrahydrocannabinol NONE DETECTED NONE DETECTED   Barbiturates NONE DETECTED NONE DETECTED    Comment: (NOTE) DRUG SCREEN FOR MEDICAL PURPOSES ONLY.  IF CONFIRMATION IS NEEDED FOR ANY PURPOSE, NOTIFY LAB WITHIN 5 DAYS. LOWEST DETECTABLE LIMITS FOR URINE DRUG SCREEN Drug Class                     Cutoff (ng/mL) Amphetamine  and metabolites    1000 Barbiturate and metabolites    200 Benzodiazepine                 200 Tricyclics and metabolites     300 Opiates and metabolites  300 Cocaine and metabolites        300 THC                            50 Performed at [facility], [address], [place], [place] [address]   CBC with Diff     Status: Abnormal   Collection Time: 07/31/18  4:05 PM  Result Value Ref Range   WBC 3.7 (L) 4.0 - 10.5 K/uL   RBC 4.42 4.22 - 5.81 MIL/uL   Hemoglobin 14.0 13.0 - 17.0 g/dL   HCT 60.4 54.0 - 98.1 %   MCV 97.1 80.0 - 100.0 fL   MCH 31.7 26.0 - 34.0 pg   MCHC 32.6 30.0 - 36.0 g/dL   RDW 19.1 47.8 - 29.5 %   Platelets 189 150 - 400 K/uL   nRBC 0.0 0.0 - 0.2 %   Neutrophils Relative % 48 %   Neutro Abs 1.8 1.7 - 7.7 K/uL   Lymphocytes Relative 39 %   Lymphs Abs 1.4 0.7 - 4.0 K/uL   Monocytes Relative 11 %   Monocytes Absolute 0.4 0.1 - 1.0 K/uL   Eosinophils Relative 1 %   Eosinophils Absolute 0.0 0.0 - 0.5 K/uL   Basophils Relative 1 %   Basophils Absolute 0.0 0.0 - 0.1 K/uL   Immature Granulocytes 0 %   Abs Immature Granulocytes 0.00 0.00 - 0.07 K/uL    Comment: Performed at [facility], [address], [place], [place] [address]  Acetaminophen level     Status: Abnormal   Collection Time: 07/31/18  4:05 PM  Result Value Ref Range   Acetaminophen (Tylenol), Serum <10 (L) 10 - 30 ug/mL    Comment: (NOTE) Therapeutic concentrations vary significantly. A range of 10-30 ug/mL  may be an effective concentration for many patients. However, some  are best treated at concentrations outside of this range. Acetaminophen concentrations >150 ug/mL at 4 hours after ingestion  and >50 ug/mL at 12 hours after ingestion are often associated with  toxic reactions. Performed at [facility], [address] [place], [place] [address]   Salicylate level     Status: None   Collection Time: 07/31/18  4:05 PM   Result Value Ref Range   Salicylate Lvl <7.0 2.8 - 30.0 mg/dL    Comment: Performed at [facility], [address] [place], [place] [address]    Blood Alcohol level:  Lab Results  Component Value Date   [facility] <10 07/31/2018   ETH <5 07/04/2016    Musculoskeletal: Strength & Muscle Tone: within normal limits Gait & Station: UTA since patient is lying in bed. Patient leans: N/A  Psychiatric Specialty Exam: Physical Exam  Nursing note and vitals reviewed. Constitutional: He is oriented to person, place, and time. He appears well-developed and well-nourished.  HENT:  Head: Normocephalic and atraumatic.  Neck: Normal range of motion.  Respiratory: Effort normal.  Musculoskeletal: Normal range of motion.  Neurological: He is alert and oriented to person, place, and time.  Psychiatric: His speech is normal and behavior is normal. Judgment and thought content normal. His affect is blunt. Cognition and memory are impaired.    Review of Systems  Psychiatric/Behavioral: Positive for substance abuse. Negative for hallucinations and suicidal ideas.  All other systems reviewed and are negative.   Blood pressure 117/86, pulse (!) 57, temperature 98.3 F (36.8 C), temperature source Oral, resp. rate  16, SpO2 100 %.There is no height or weight on file to calculate BMI.  General Appearance: Disheveled, young, African American male, wearing paper hospital scrubs with hair locs who is lying in bed. NAD.   Eye Contact:  Fair  Speech:  Clear and Coherent and Slow  Volume:  Decreased  Mood:  Euthymic  Affect:  Blunt  Thought Process:  Linear and Descriptions of Associations: Intact  Orientation:  Full (Time, Place, and Person)  Thought Content:  Delusions and Paranoid Ideation  Suicidal Thoughts:  No  Homicidal Thoughts:  No  Memory:  Immediate;   Good Recent;   Good Remote;   Good  Judgement:  Impaired  Insight:  Shallow  Psychomotor Activity:  Normal   Concentration:  Concentration: Good and Attention Span: Good  Recall:  Good  Fund of Knowledge:  Good  Language:  Good  Akathisia:  No  Handed:  Right  AIMS (if indicated):   N/A  Assets:  Communication Skills Physical Health Resilience  ADL's:  Intact  Cognition:  Impaired due to psychiatric condition.   Sleep:   Okay   Assessment:  Jake Neal is a 28 y.o. male who was admitted with aggression and psychosis in the setting of poor medication compliance. He continues to warrant inpatient psychiatric hospitalization for stabilization and treatment.    Treatment Plan Summary: -Continue Prozac 20 mg daily for mood. -Continue Haldol 2 mg BID for psychosis.  -Will order forced medications if patient refuses PO medications due to poor insight about his condition.   Cherly Beach, DO 08/02/2018, 11:57 AM

## 2018-08-02 NOTE — ED Notes (Signed)
Patient informs nurse tech that he can't breath. When entering the room, patient is lying on his back with one of his arms over his eyes. Patient appears in no acute distress. Respirations are even, regular, and unlabored. Patient is talking in full sentences with no complications. Skin is warm and dry. Lung sounds clear in all anterior and posterior lobes. Patient states over the day he has felt like he can breath and denies it started all of a sudden.

## 2018-08-02 NOTE — BH Assessment (Signed)
[facility] Assessment Progress Note  Per Juanetta Beets, DO, this pt requires psychiatric hospitalization.  Berneice Heinrich, RN, [facility] has assigned pt to [facility] Rm [id number]; [facility] will be ready to receive pt at 15:30.  Pt presents under IVC initiated by pt's aunt, and upheld by Thedore Mins, MD, and IVC documents have been faxed to [facility].  Pt's nurse, Morrie Sheldon, has been notified, and agrees to call report to [phone].  Pt is to be transported via [employer].   Doylene Canning, [place] Behavioral Health Coordinator [phone]

## 2018-08-02 NOTE — ED Notes (Signed)
PT IS IVC 

## 2018-08-02 NOTE — ED Notes (Signed)
PSYCH TEAM AT BEDSIDE

## 2018-08-02 NOTE — ED Notes (Signed)
Pt discharged safely with [employer].  Pt was calm and cooperative.  All belongings were sent with pt. 

## 2018-08-03 DIAGNOSIS — F19959 Other psychoactive substance use, unspecified with psychoactive substance-induced psychotic disorder, unspecified: Secondary | ICD-10-CM

## 2018-08-03 LAB — LIPID PANEL
Cholesterol: 193 mg/dL (ref 0–200)
HDL: 42 mg/dL (ref >40)
LDL Cholesterol: 138 mg/dL — ABNORMAL HIGH (ref 0–99)
Total CHOL/HDL Ratio: 4.6 RATIO
Triglycerides: 64 mg/dL (ref <150)
VLDL: 13 mg/dL (ref 0–40)

## 2018-08-03 LAB — TSH: TSH: 1.181 u[IU]/mL (ref 0.350–4.500)

## 2018-08-03 LAB — HEMOGLOBIN A1C
Hgb A1c MFr Bld: 4.9 % (ref 4.8–5.6)
Mean Plasma Glucose: 93.93 mg/dL

## 2018-08-03 MED ORDER — TRAZODONE HCL 150 MG PO TABS
150.0000 mg | ORAL_TABLET | Freq: Every evening | ORAL | Status: DC | PRN
  Administered 2018-08-04: 150 mg via ORAL
  Filled 2018-08-03 (×3): qty 1
  Filled 2018-08-03: qty 14

## 2018-08-03 MED ORDER — HALOPERIDOL 5 MG PO TABS
5.0000 mg | ORAL_TABLET | Freq: Three times a day (TID) | ORAL | Status: DC
  Administered 2018-08-03 – 2018-08-06 (×10): 5 mg via ORAL
  Filled 2018-08-03: qty 21
  Filled 2018-08-03 (×4): qty 1
  Filled 2018-08-03: qty 21
  Filled 2018-08-03 (×3): qty 1
  Filled 2018-08-03 (×2): qty 21
  Filled 2018-08-03: qty 1
  Filled 2018-08-03 (×2): qty 21
  Filled 2018-08-03: qty 1
  Filled 2018-08-03: qty 21
  Filled 2018-08-03: qty 1
  Filled 2018-08-03: qty 21
  Filled 2018-08-03: qty 1
  Filled 2018-08-03: qty 21
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
## 2018-08-03 NOTE — Progress Notes (Signed)
Did not attend group 
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
## 2018-08-03 NOTE — Progress Notes (Signed)
Patient has been isolative to room this shift.  Patient reports seeing shadows but denies VH this shift. Patient has been compliant with medications this shift, but did not attend groups.    Assess patient for safety, offer medications as prescribed, engage patient with 1:1 staff talks.   Patient able to contract for safety. Continue to monitor as planned.

## 2018-08-03 NOTE — B[facility] Suicide Risk Assessment (Signed)
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  He denies wanting to harm himself on this evaluation can contract for safety repeats back what that means  Continued Clinical Symptoms:  Alcohol Use Disorder Identification Test Final Score (AUDIT): 0 The "Alcohol Use Disorders Identification Test", Guidelines for Use in Primary Care, Second Edition.  [facility]). Score between 0-7:  no or low risk or alcohol related problems. Score between 8-15:  moderate risk of alcohol related problems. Score between 16-19:  high risk of alcohol related problems. Score 20 or above:  warrants further diagnostic evaluation for alcohol dependence and treatment.   CLINICAL FACTORS:   Schizophrenia:   Less than 52 years old   COGNITIVE FEATURES THAT CONTRIBUTE TO RISK:  Loss of executive function    SUICIDE RISK:   Minimal: No identifiable suicidal ideation.  Patients presenting with no risk factors but with morbid ruminations; may be classified as minimal risk based on the severity of the depressive  symptoms  PLAN OF CARE: Met for stabilization and med treatment  I certify that inpatient services furnished can reasonably be expected to improve the patient's condition.   Johnn Hai, MD 08/03/2018, 10:33 AM

## 2018-08-03 NOTE — [facility] Group Notes (Signed)
[facility] LCSW Group Therapy Note  Date/Time: 08/03/18, 1100  Type of Therapy/Topic:  Group Therapy:  Feelings about Diagnosis  Participation Level:  Did Not Attend   Mood:   Description of Group:    This group will allow patients to explore their thoughts and feelings about diagnoses they have received. Patients will be guided to explore their level of understanding and acceptance of these diagnoses. Facilitator will encourage patients to process their thoughts and feelings about the reactions of others to their diagnosis, and will guide patients in identifying ways to discuss their diagnosis with significant others in their lives. This group will be process-oriented, with patients participating in exploration of their own experiences as well as giving and receiving support and challenge from other group members.   Therapeutic Goals: 1. Patient will demonstrate understanding of diagnosis as evidence by identifying two or more symptoms of the disorder:  2. Patient will be able to express two feelings regarding the diagnosis 3. Patient will demonstrate ability to communicate their needs through discussion and/or role plays  Summary of Patient Progress:        Therapeutic Modalities:   Cognitive Behavioral Therapy Brief Therapy Feelings Identification   Greg Esha Fincher, LCSW 

## 2018-08-03 NOTE — Progress Notes (Signed)
Recreation Therapy Notes  Date: 3.10.20 Time: 1000 Location: 500 Hall Dayroom  Group Topic: Anger Management  Goal Area(s) Addresses:  Patient will identify triggers for anger.  Patient will identify a situation that makes them angry.  Patient will identify what other emotions comes with anger.   Intervention: Worksheet  Activity: Introduction to Anger Management.  Patients were given a worksheet in which they were to identify what leads them to feel angry, what they do when angry and problems their anger has caused.  Education: Anger Management, Discharge Planning   Education Outcome: Acknowledges education/In group clarification offered/Needs additional education.   Clinical Observations/Feedback: Patient did not attend group.     Caroll Rancher, LRT/CTRS    Lillia Abed, Manju Kulkarni A 08/03/2018 11:13 AM

## 2018-08-03 NOTE — Tx Team (Signed)
Initial Treatment Plan 08/03/2018 2:18 AM Jake Neal YKD:[id number]    PATIENT STRESSORS: Health problems Medication change or noncompliance   PATIENT STRENGTHS: General fund of knowledge Motivation for treatment/growth   PATIENT IDENTIFIED PROBLEMS: psychosis  "mental"                   DISCHARGE CRITERIA:  Improved stabilization in mood, thinking, and/or behavior Verbal commitment to aftercare and medication compliance  PRELIMINARY DISCHARGE PLAN: Attend PHP/IOP Outpatient therapy  PATIENT/FAMILY INVOLVEMENT: This treatment plan has been presented to and reviewed with the patient, Jake Neal.  The patient and family have been given the opportunity to ask questions and make suggestions.  Delos Haring, RN 08/03/2018, 2:18 AM
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
## 2018-08-03 NOTE — Progress Notes (Signed)
D: Pt denies SI/HI/AVH. Pt continues to isolate to his room this evening, pt has minimal interaction with peers / staff. Pt forwards little information this evening.   A: Pt was offered support and encouragement. Pt was given scheduled medications. Pt was encourage to attend groups. Q 15 minute checks were done for safety.   R: safety maintained on unit.   Problem: Coping: Goal: Coping ability will improve Outcome: Not Progressing   Problem: Role Relationship: Goal: Ability to interact with others will improve Outcome: Not Progressing   Problem: Safety: Goal: Ability to remain free from injury will improve Outcome: Progressing

## 2018-08-04 NOTE — Progress Notes (Signed)
[facility] MD Progress Note  08/04/2018 9:31 AM Jake Neal  MRN:  [id number] Subjective:    Patient seen in his room is more conversant today alert oriented to person place situation not exact date.  Focused on discharge denies all current positive symptoms such as hallucinations or paranoia.  Denies wanting to harm self or others again interview is compromised by his focus on discharge and probable minimizing of all symptoms previously expressed denies feeling like someone is watching him. No EPS or TD  Principal Problem: Psychosis/recent traumas/history of substance abuse Diagnosis: Active Problems:   Substance-induced psychotic disorder (HCC)  Total Time spent with patient: 20 minutes  Past Medical History:  Past Medical History:  Diagnosis Date  . Medical history non-contributory    History reviewed. No pertinent surgical history. Family History: History reviewed. No pertinent family history.  Social History:  Social History   Substance and Sexual Activity  Alcohol Use No     Social History   Substance and Sexual Activity  Drug Use Yes  . Types: Marijuana    Social History   Socioeconomic History  . Marital status: Single    Spouse name: Not on file  . Number of children: Not on file  . Years of education: Not on file  . Highest education level: Not on file  Occupational History  . Not on file  Social Needs  . Financial resource strain: Not on file  . Food insecurity:    Worry: Not on file    Inability: Not on file  . Transportation needs:    Medical: Not on file    Non-medical: Not on file  Tobacco Use  . Smoking status: Former Games developer  . Smokeless tobacco: Never Used  Substance and Sexual Activity  . Alcohol use: No  . Drug use: Yes    Types: Marijuana  . Sexual activity: Yes    Birth control/protection: None  Lifestyle  . Physical activity:    Days per week: Not on file    Minutes per session: Not on file  . Stress: Not on file  Relationships  .  Social connections:    Talks on phone: Not on file    Gets together: Not on file    Attends religious service: Not on file    Active member of club or organization: Not on file    Attends meetings of clubs or organizations: Not on file    Relationship status: Not on file  Other Topics Concern  . Not on file  Social History Narrative  . Not on file   Additional Social History:                         Sleep: Good  Appetite:  Good  Current Medications: Current Facility-Administered Medications  Medication Dose Route Frequency Provider Last Rate Last Dose  . acetaminophen (TYLENOL) tablet 650 mg  650 mg Oral Q6H PRN Nira Conn A, NP      . alum & mag hydroxide-simeth (MAALOX/MYLANTA) 200-200-20 MG/5ML suspension 30 mL  30 mL Oral Q4H PRN Nira Conn A, NP      . benztropine (COGENTIN) tablet 0.5 mg  0.5 mg Oral Daily Nira Conn A, NP   0.5 mg at 08/04/18 0756  . FLUoxetine (PROZAC) capsule 20 mg  20 mg Oral Daily Nira Conn A, NP   20 mg at 08/04/18 0756  . haloperidol (HALDOL) tablet 5 mg  5 mg Oral TID Malvin Johns, MD  5 mg at 08/04/18 0756  . hydrOXYzine (ATARAX/VISTARIL) tablet 25 mg  25 mg Oral Q6H PRN Nira Conn A, NP   25 mg at 08/02/18 2143  . magnesium hydroxide (MILK OF MAGNESIA) suspension 30 mL  30 mL Oral Daily PRN Jackelyn Poling, NP      . traZODone (DESYREL) tablet 150 mg  150 mg Oral QHS PRN,MR X 1 Malvin Johns, MD        Lab Results:  Results for orders placed or performed during the hospital encounter of 08/02/18 (from the past 48 hour(s))  Hemoglobin A1c     Status: None   Collection Time: 08/03/18  6:47 AM  Result Value Ref Range   Hgb A1c MFr Bld 4.9 4.8 - 5.6 %    Comment: (NOTE) Pre diabetes:          5.7%-6.4% Diabetes:              >6.4% Glycemic control for   <7.0% adults with diabetes    Mean Plasma Glucose 93.93 mg/dL    Comment: Performed at [facility], [address], [place], [place] [address]  Lipid panel      Status: Abnormal   Collection Time: 08/03/18  6:47 AM  Result Value Ref Range   Cholesterol 193 0 - 200 mg/dL   Triglycerides 64 <562 mg/dL   HDL 42 >56 mg/dL   Total CHOL/HDL Ratio 4.6 RATIO   VLDL 13 0 - 40 mg/dL   LDL Cholesterol 389 (H) 0 - 99 mg/dL    Comment:        Total Cholesterol/HDL:CHD Risk Coronary Heart Disease Risk Table                     Men   Women  1/2 Average Risk   3.4   3.3  Average Risk       5.0   4.4  2 X Average Risk   9.6   7.1  3 X Average Risk  23.4   11.0        Use the calculated Patient Ratio above and the CHD Risk Table to determine the patient's CHD Risk.        ATP III CLASSIFICATION (LDL):  <100     mg/dL   Optimal  373-428  mg/dL   Near or Above                    Optimal  130-159  mg/dL   Borderline  768-115  mg/dL   High  >726     mg/dL   Very High Performed at [facility], [address] [place], [place] [address]   TSH     Status: None   Collection Time: 08/03/18  6:47 AM  Result Value Ref Range   TSH 1.181 0.350 - 4.500 uIU/mL    Comment: Performed by a 3rd Generation assay with a functional sensitivity of <=0.01 uIU/mL. Performed at [facility], [address] [place], [place] [address]     Blood Alcohol level:  Lab Results  Component Value Date   [facility] <10 07/31/2018   ETH <5 07/04/2016    Metabolic Disorder Labs: Lab Results  Component Value Date   HGBA1C 4.9 08/03/2018   MPG 93.93 08/03/2018   MPG 100 07/04/2016   Lab Results  Component Value Date   PROLACTIN 9.9 07/06/2016   PROLACTIN 14.0 07/04/2016   Lab Results  Component Value  Date   CHOL 193 08/03/2018   TRIG 64 08/03/2018   HDL 42 08/03/2018   CHOLHDL 4.6 08/03/2018   VLDL 13 08/03/2018   LDLCALC 138 (H) 08/03/2018   LDLCALC 97 07/06/2016    Physical Findings: AIMS: Facial and Oral Movements Muscles of Facial Expression: None, normal Lips and Perioral Area: None, normal Jaw: None, normal Tongue:  None, normal,Extremity Movements Upper (arms, wrists, hands, fingers): None, normal Lower (legs, knees, ankles, toes): None, normal, Trunk Movements Neck, shoulders, hips: None, normal, Overall Severity Severity of abnormal movements (highest score from questions above): None, normal Incapacitation due to abnormal movements: None, normal Patient's awareness of abnormal movements (rate only patient's report): No Awareness, Dental Status Current problems with teeth and/or dentures?: No Does patient usually wear dentures?: No  CIWA:    COWS:     Musculoskeletal: Strength & Muscle Tone: within normal limits Gait & Station: normal Patient leans: N/A  Psychiatric Specialty Exam: Physical Exam  ROS  Blood pressure 111/76, pulse 94, temperature 97.7 F (36.5 C), temperature source Oral, resp. rate 18, height 6' (1.829 m), weight 61.2 kg, SpO2 100 %.Body mass index is 18.31 kg/m.  General Appearance: Guarded  Eye Contact:  Fair  Speech:  Clear and Coherent  Volume:  Decreased  Mood:  Dysphoric  Affect:  Flat  Thought Process:  Goal Directed  Orientation:  Full (Time, Place, and Person)  Thought Content:  Logical and Delusions  Suicidal Thoughts:  No  Homicidal Thoughts:  No  Memory:  Immediate;   Fair  Judgement:  Fair  Insight:  Fair  Psychomotor Activity:  Decreased  Concentration:  Concentration: Fair  Recall:  [employer] of Knowledge:  Fair  Language:  Fair  Akathisia:  Negative  Handed:  Right  AIMS (if indicated):     Assets:  Leisure Time Physical Health  ADL's:  Intact  Cognition:  WNL  Sleep:  Number of Hours: 6.75     Treatment Plan Summary: Daily contact with patient to assess and evaluate symptoms and progress in treatment, Medication management and Plan Continue current precautions and antipsychotic therapy continue current reality-based therapy no change otherwise in meds or precautions or groups  Malvin Johns, MD 08/04/2018, 9:31 AM

## 2018-08-04 NOTE — Tx Team (Signed)
Interdisciplinary Treatment and Diagnostic Plan Update  08/04/2018 Time of Session: [facility] MRN: [id number]  Principal Diagnosis: <principal problem not specified>  Secondary Diagnoses: Active Problems:   Substance-induced psychotic disorder ([place])   Current Medications:  Current Facility-Administered Medications  Medication Dose Route Frequency Provider Last Rate Last Dose  . acetaminophen (TYLENOL) tablet 650 mg  650 mg Oral Q6H PRN Lindon Romp A, NP      . alum & mag hydroxide-simeth (MAALOX/MYLANTA) 200-200-20 MG/5ML suspension 30 mL  30 mL Oral Q4H PRN Lindon Romp A, NP      . benztropine (COGENTIN) tablet 0.5 mg  0.5 mg Oral Daily Lindon Romp A, NP   0.5 mg at 08/04/18 0756  . FLUoxetine (PROZAC) capsule 20 mg  20 mg Oral Daily Lindon Romp A, NP   20 mg at 08/04/18 0756  . haloperidol (HALDOL) tablet 5 mg  5 mg Oral TID Johnn Hai, MD   5 mg at 08/04/18 1205  . hydrOXYzine (ATARAX/VISTARIL) tablet 25 mg  25 mg Oral Q6H PRN Lindon Romp A, NP   25 mg at 08/02/18 2143  . magnesium hydroxide (MILK OF MAGNESIA) suspension 30 mL  30 mL Oral Daily PRN Lindon Romp A, NP      . traZODone (DESYREL) tablet 150 mg  150 mg Oral QHS PRN,MR X 1 Johnn Hai, MD       PTA Medications: Medications Prior to Admission  Medication Sig Dispense Refill Last Dose  . ARIPiprazole (ABILIFY) 10 MG tablet Take 1 tablet (10 mg total) by mouth daily. (Patient not taking: Reported on 07/31/2018) 30 tablet 0 Not Taking at Unknown time  . FLUoxetine (PROZAC) 20 MG capsule Take 1 capsule (20 mg total) by mouth daily. (Patient not taking: Reported on 07/31/2018) 30 capsule 0 Not Taking at Unknown time  . hydrOXYzine (ATARAX/VISTARIL) 25 MG tablet Take 1 tablet (25 mg total) by mouth every 6 (six) hours as needed for anxiety. (Patient not taking: Reported on 07/31/2018) 30 tablet 0 Not Taking at Unknown time  . prazosin (MINIPRESS) 2 MG capsule Take 1 capsule (2 mg total) by mouth at bedtime. (Patient  not taking: Reported on 07/31/2018) 30 capsule 0 Not Taking at Unknown time  . traZODone (DESYREL) 50 MG tablet Take 1 tablet (50 mg total) by mouth at bedtime. (Patient not taking: Reported on 07/31/2018) 30 tablet 0 Not Taking at Unknown time    Patient Stressors: Health problems Medication change or noncompliance  Patient Strengths: Technical sales engineer for treatment/growth  Treatment Modalities: Medication Management, Group therapy, Case management,  1 to 1 session with clinician, Psychoeducation, Recreational therapy.   Physician Treatment Plan for Primary Diagnosis: <principal problem not specified> Long Term Goal(s): Improvement in symptoms so as ready for discharge Improvement in symptoms so as ready for discharge   Short Term Goals: Ability to demonstrate self-control will improve Ability to maintain clinical measurements within normal limits will improve  Medication Management: Evaluate patient's response, side effects, and tolerance of medication regimen.  Therapeutic Interventions: 1 to 1 sessions, Unit Group sessions and Medication administration.  Evaluation of Outcomes: Not Met  Physician Treatment Plan for Secondary Diagnosis: Active Problems:   Substance-induced psychotic disorder ([place])  Long Term Goal(s): Improvement in symptoms so as ready for discharge Improvement in symptoms so as ready for discharge   Short Term Goals: Ability to demonstrate self-control will improve Ability to maintain clinical measurements within normal limits will improve     Medication Management: Evaluate  patient's response, side effects, and tolerance of medication regimen.  Therapeutic Interventions: 1 to 1 sessions, Unit Group sessions and Medication administration.  Evaluation of Outcomes: Not Met   RN Treatment Plan for Primary Diagnosis: <principal problem not specified> Long Term Goal(s): Knowledge of disease and therapeutic regimen to maintain health will  improve  Short Term Goals: Ability to identify and develop effective coping behaviors will improve and Compliance with prescribed medications will improve  Medication Management: RN will administer medications as ordered by provider, will assess and evaluate patient's response and provide education to patient for prescribed medication. RN will report any adverse and/or side effects to prescribing provider.  Therapeutic Interventions: 1 on 1 counseling sessions, Psychoeducation, Medication administration, Evaluate responses to treatment, Monitor vital signs and CBGs as ordered, Perform/monitor CIWA, COWS, AIMS and Fall Risk screenings as ordered, Perform wound care treatments as ordered.  Evaluation of Outcomes: Not Met   LCSW Treatment Plan for Primary Diagnosis: <principal problem not specified> Long Term Goal(s): Safe transition to appropriate next level of care at discharge, Engage patient in therapeutic group addressing interpersonal concerns.  Short Term Goals: Engage patient in aftercare planning with referrals and resources, Increase social support and Increase skills for wellness and recovery  Therapeutic Interventions: Assess for all discharge needs, 1 to 1 time with Social worker, Explore available resources and support systems, Assess for adequacy in community support network, Educate family and significant other(s) on suicide prevention, Complete Psychosocial Assessment, Interpersonal group therapy.  Evaluation of Outcomes: Not Met   Progress in Treatment: Attending groups: No. Participating in groups: No. Taking medication as prescribed: Yes. Toleration medication: Yes. Family/Significant other contact made: No, will contact:  aunt Patient understands diagnosis: No. Discussing patient identified problems/goals with staff: Yes. Medical problems stabilized or resolved: Yes. Denies suicidal/homicidal ideation: Yes. Issues/concerns per patient self-inventory: Yes. Other:  None  New problem(s) identified: No, Describe:  none  New Short Term/Long Term Goal(s):  Patient Goals:  "get my mental right"  Discharge Plan or Barriers:   Reason for Continuation of Hospitalization: Delusions  Depression Medication stabilization  Estimated Length of Stay: 3-5 days  Attendees: Patient:Jake Neal 08/04/2018   Physician: Dr. Jake Samples, MD 08/04/2018   Nursing: Elesa Massed, RN 08/04/2018   RN Care Manager: 08/04/2018   Social Worker: Lurline Idol, LCSW 08/04/2018   Recreational Therapist:  08/04/2018   Other:  08/04/2018   Other:  08/04/2018   Other: 08/04/2018      Scribe for Treatment Team: Joanne Chars, LCSW 08/04/2018 1:48 PM

## 2018-08-04 NOTE — Progress Notes (Signed)
Did not attend group 

## 2018-08-04 NOTE — Progress Notes (Signed)
Recreation Therapy Notes  Date: 3.11.20 Time: 1000 Location: 500 [address] Dayroom  Group Topic: Communication, Team Building, Problem Solving  Goal Area(s) Addresses:  Patient will effectively work with peer towards shared goal.  Patient will identify skill used to make activity successful.  Patient will identify how skills used during activity can be used to reach post d/c goals.   Intervention: STEM Activity   Activity: In team's, using 20 plastic straws and 2 feet of masking tape, patients were asked to build a bridge that could stand on its own and hold a small box of cards.    Education: Pharmacist, community, Building control surveyor.   Education Outcome: Acknowledges education/In group clarification offered/Needs additional education.   Clinical Observations/Feedback: Pt did not attend group.     Caroll Rancher, LRT/CTRS    Caroll Rancher A 08/04/2018 10:52 AM

## 2018-08-04 NOTE — Progress Notes (Signed)
D: Pt denies SI/HI/AVH. Pt is pleasant and cooperative. Pt visible on the unit this evening. Pt stated he had a good day. Pt kept to himself much of the evening, but sat in the dayroom for a little while this evening.   A: Pt was offered support and encouragement. Pt was given sleep medications. Pt was encourage to attend groups. Q 15 minute checks were done for safety.   R: Pt is taking medication. Pt has no complaints.Pt receptive to treatment and safety maintained on unit.   Problem: Activity: Goal: Will verbalize the importance of balancing activity with adequate rest periods Outcome: Progressing   Problem: Education: Goal: Will be free of psychotic symptoms Outcome: Progressing   Problem: Role Relationship: Goal: Ability to communicate needs accurately will improve Outcome: Progressing

## 2018-08-05 MED ORDER — HALOPERIDOL DECANOATE 100 MG/ML IM SOLN
100.0000 mg | INTRAMUSCULAR | Status: DC
  Administered 2018-08-05: 100 mg via INTRAMUSCULAR
  Filled 2018-08-05: qty 1

## 2018-08-05 NOTE — [facility] Group Notes (Signed)
[facility] Mental Health Association Group Therapy 08/05/2018 1:15pm  Type of Therapy: Mental Health Association Presentation  Participation Level: Did not attend  Participation Quality:   Affect:   Cognitive:   Insight:   Engagement in Therapy:   Modes of Intervention: Discussion, Education and Socialization  Summary of Progress/Problems: Mental Health Association (MHA) Speaker came to talk about his personal journey with mental health. The pt processed ways by which to relate to the speaker. MHA speaker provided handouts and educational information pertaining to groups and services offered by the [facility]. Pt was engaged in speaker's presentation and was receptive to resources provided.    Lorri Frederick, LCSW 08/05/2018 3:00 PM

## 2018-08-05 NOTE — Progress Notes (Signed)
Recreation Therapy Notes  Date: 3.12.20 Time: 1000 Location: 500 Hall Dayroom  Group Topic: Self-Esteem  Goal Area(s) Addresses:  Patient will successfully identify positive attributes about themselves.  Patient will successfully identify benefit of improved self-esteem.   Intervention: Worksheet, pencils  Activity: My Strengths and Qualities.  Patients were to identify three things they art good at, compliments they have received, appearance, challenges they overcame, how they helped others, what they value, times they made others happy and what makes them unique.  Education: Self-Esteem, Building control surveyor.   Education Outcome: Acknowledges education/In group clarification offered/Needs additional education  Clinical Observations/Feedback: Pt did not attend group.    Caroll Rancher, LRT/CTRS         Lillia Abed, Tarah Buboltz A 08/05/2018 11:07 AM
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
## 2018-08-05 NOTE — Progress Notes (Signed)
Did not attend group 
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
## 2018-08-05 NOTE — Progress Notes (Signed)
DAR NOTE: Patient presents with flat affect and depressed mood.  Denies suicidal thoughts,pain, auditory and visual hallucinations.  Rates depression at 2, hopelessness at 1, and anxiety at 4.  Maintained on routine safety checks.  Medications given as prescribed.  Support and encouragement offered as needed.  Attended group and participated.  States goal for today is "keep my head up and my mind stable."  Patient remained in his room for majority of this shift.  Patient is safe on and off the unit.  Offered no complaint.

## 2018-08-06 MED ORDER — TRAZODONE HCL 150 MG PO TABS
150.0000 mg | ORAL_TABLET | Freq: Every evening | ORAL | Status: DC | PRN
  Filled 2018-08-06: qty 7

## 2018-08-06 MED ORDER — BENZTROPINE MESYLATE 0.5 MG PO TABS: 0.5000 mg | ORAL_TABLET | Freq: Every day | ORAL | 1 refills | Status: DC

## 2018-08-06 MED ORDER — PRAZOSIN HCL 2 MG PO CAPS
2.0000 mg | ORAL_CAPSULE | Freq: Every day | ORAL | Status: DC
  Filled 2018-08-06: qty 7

## 2018-08-06 MED ORDER — HALOPERIDOL 5 MG PO TABS: ORAL_TABLET | ORAL | 1 refills | Status: DC

## 2018-08-06 MED ORDER — TRAZODONE HCL 150 MG PO TABS: 150.0000 mg | ORAL_TABLET | Freq: Every evening | ORAL | 1 refills | Status: DC | PRN

## 2018-08-06 MED ORDER — HALOPERIDOL DECANOATE 100 MG/ML IM SOLN: 100.0000 mg | INTRAMUSCULAR | 11 refills | Status: DC

## 2018-08-06 MED ORDER — HALOPERIDOL 5 MG PO TABS
5.0000 mg | ORAL_TABLET | Freq: Every day | ORAL | Status: DC
  Filled 2018-08-06 (×2): qty 21

## 2018-08-06 NOTE — Progress Notes (Signed)
D:  Jake Neal was isolative to his room this evening.  He denied SI/HI or A/V hallucinations.  During nursing interaction, he did not take the blanket off his face.  He did not attend evening wrap up group but did get up later to get a snack.  He denied needing anything for sleep tonight.  He is currently resting with his eyes closed and appears to be asleep. A:  1:1 with RN for support and encouragement.  Medications as ordered.  Q 15 minute checks maintained for safety.  Encouraged participation in group and unit activities.   R:  Jake Neal remains safe on the unit.  We will continue to monitor the progress towards his goals.

## 2018-08-06 NOTE — Progress Notes (Signed)
Recreation Therapy Notes  Date: 3.13.20 Time: 1000 Location: 500 Hall Day Room  Group Topic: Communication, Team Building, Problem Solving  Goal Area(s) Addresses:  Patient will effectively work with peer towards shared goal.  Patient will identify skill used to make activity successful.  Patient will identify how skills used during activity can be used to reach post d/c goals.   Behavioral Response:  None  Intervention: STEM Activity   Activity:  [employer].  Patients were broken up into groups.  Each group was given 12 pipe cleaner.  Groups were to build a free standing tower using all of the pipe cleaner given.  During the course of the activity, the groups would experience budget cuts in which they would have to put one hand behind their back and would not be able to speak to each other.  Education: Pharmacist, community, Building control surveyor.   Education Outcome: Acknowledges education  Clinical Observations/Feedback:  Pt sat and observed.  Pt did not participate in activity.     Caroll Rancher, LRT/CTRS    Caroll Rancher A 08/06/2018 11:25 AM

## 2018-08-06 NOTE — [facility] Suicide Risk Assessment (Signed)
[facility] Discharge Suicide Risk Assessment   Nursing information obtained from:  Patient Demographic factors:  Male, Unemployed Current Mental Status:  NA Loss Factors:  NA Historical Factors:  NA Risk Reduction Factors:  NA  Total Time spent with patient: 45 minutes Principal Problem: Substance-induced psychotic disorder with delusions (HCC) Diagnosis:  Principal Problem:   Substance-induced psychotic disorder with delusions (HCC) Active Problems:   Substance-induced psychotic disorder (HCC)  Subjective Data: Patient has been untreated/undertreated due to noncompliance for period of time therefore long-acting injectable was requested by his on he is discharged today in good condition alert oriented to person place situation without thoughts of harming self or others and contracting fully denying all positive symptoms some negative symptoms do persist however  Continued Clinical Symptoms:  Alcohol Use Disorder Identification Test Final Score (AUDIT): 0 The "Alcohol Use Disorders Identification Test", Guidelines for Use in Primary Care, Second Edition.  World Science writer [facility]). Score between 0-7:  no or low risk or alcohol related problems. Score between 8-15:  moderate risk of alcohol related problems. Score between 16-19:  high risk of alcohol related problems. Score 20 or above:  warrants further diagnostic evaluation for alcohol dependence and treatment.   CLINICAL FACTORS:   Schizophrenia:   Paranoid or undifferentiated type   Musculoskeletal:  ADL's:  Intact  Cognition:  WNL  Sleep:  Number of Hours: 6.75      COGNITIVE FEATURES THAT CONTRIBUTE TO RISK:  Loss of executive function    SUICIDE RISK:   Minimal: No identifiable suicidal ideation.  Patients presenting with no risk factors but with morbid ruminations; may be classified as minimal risk based on the severity of the depressive symptoms  PLAN OF CARE: Stable for release  I certify that inpatient  services furnished can reasonably be expected to improve the patient's condition.   Malvin Johns, MD 08/06/2018, 8:27 AM

## 2018-08-06 NOTE — Progress Notes (Signed)
Pt discharged to lobby. Pt was stable and appreciative at that time. All papers, samples and prescriptions were given and valuables returned. Verbal understanding expressed. Denies SI/HI and A/VH. Pt given opportunity to express concerns and ask questions.  
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
## 2018-08-06 NOTE — Progress Notes (Signed)
Recreation Therapy Notes  INPATIENT RECREATION TR PLAN  Patient Details Name: Jake Neal MRN: [id number] DOB: 05/22/1991 Today's Date: 08/06/2018  Rec Therapy Plan Is patient appropriate for Therapeutic Recreation?: Yes Treatment times per week: about 3 days Estimated Length of Stay: 5-7 days TR Treatment/Interventions: Group participation (Comment)  Discharge Criteria Pt will be discharged from therapy if:: Discharged Treatment plan/goals/alternatives discussed and agreed upon by:: Patient/family  Discharge Summary Short term goals set: See patient care plan Short term goals met: Not met Progress toward goals comments: Groups attended Which groups?: Communication Reason goals not met: Pt attended one group session and did not participate. Therapeutic equipment acquired: None Reason patient discharged from therapy: Discharge from hospital Pt/family agrees with progress & goals achieved: Yes Date patient discharged from therapy: 08/06/18      , LRT/CTRS  ,  A 08/06/2018, 11:43 AM  

## 2018-08-06 NOTE — Plan of Care (Signed)
Pt attended one recreation therapy group and did not participate.   Caroll Rancher, LRT/CTRS

## 2018-08-06 NOTE — Progress Notes (Signed)
°  [facility] Adult Case Management Discharge Plan :  Will you be returning to the same living situation after discharge:  Yes,  Jake Neal, Aunt At discharge, do you have transportation home?: Yes,  Bus Do you have the ability to pay for your medications: No. Medication Assistance Program  Release of information consent forms completed and in the chart;  Patient's signature needed at discharge.  Patient to Follow up at: Follow-up Information    [facility] Follow up on 08/11/2018.   Why:  Hospital follow up appointment is Wednesday, 3/18 at 8:00a. Please bring your photo ID, proof of insurance, SSN, current medications, and discharge paperwork from this hospitalization.  Contact information: [address] [place] [address] [phone]           Next level of care provider has access to [facility] Link:no  Safety Planning and Suicide Prevention discussed: Yes,  Twanna Hy, aunt, [phone]  Have you used any form of tobacco in the last 30 days? (Cigarettes, Smokeless Tobacco, Cigars, and/or Pipes): No  Has patient been referred to the Quitline?: N/A patient is not a smoker  Patient has been referred for addiction treatment: N/A  Marian Sorrow, MSW Intern 08/06/2018, 10:25 AM

## 2018-08-12 ENCOUNTER — Encounter (HOSPITAL_COMMUNITY): Payer: Self-pay | Admitting: Emergency Medicine

## 2018-08-12 ENCOUNTER — Other Ambulatory Visit: Payer: Self-pay

## 2018-08-12 ENCOUNTER — Emergency Department (HOSPITAL_COMMUNITY)
Admission: EM | Admit: 2018-08-12 | Discharge: 2018-08-12 | Disposition: A | Discharge status: Home / Self Care | Payer: Self-pay | Attending: Emergency Medicine | Admitting: Emergency Medicine

## 2018-08-12 DIAGNOSIS — R450 Nervousness: Secondary | ICD-10-CM | POA: Insufficient documentation

## 2018-08-12 DIAGNOSIS — R0602 Shortness of breath: Secondary | ICD-10-CM | POA: Insufficient documentation

## 2018-08-12 DIAGNOSIS — Z87891 Personal history of nicotine dependence: Secondary | ICD-10-CM | POA: Insufficient documentation

## 2018-08-12 DIAGNOSIS — R069 Unspecified abnormalities of breathing: Secondary | ICD-10-CM

## 2018-08-12 NOTE — Discharge Instructions (Signed)
I think some of the symptoms you have may be due to your medications. I would call [facility] in the morning to either set up an appointment or do an  E-visit to talk to them about this. You can return here for any new/acute changes.

## 2018-08-12 NOTE — ED Provider Notes (Signed)
[facility] EMERGENCY DEPARTMENT Provider Note   CSN: [id number] Arrival date & time: 08/12/18  1907    History   Chief Complaint Chief Complaint  Patient presents with  . Dizziness  . Shortness of Breath    HPI Jake Neal is a 28 y.o. male.     The history is provided by the patient and medical records.  Dizziness  Associated symptoms: shortness of breath   Shortness of Breath     28 y.o. M here with concerns of dizziness and shortness of breath.  Patient was recently hospitalized at [facility], started on multiple new medications.  States he did receive injections of medications while on the unit, and since then has felt "off".  States he started feeling this way the day before he left [facility].  States since leaving, he feels jittery and unable to sit still, yet at times somewhat dizzy and lightheaded.  He denies any room spinning sensation or syncopal events.  Shortness of breath has been ongoing-- states he knows he is breathing, but it feels "weird".  He denies cough, chest pain, fever, chills, nasal congestion, sore throat, or other URI symptoms.  He has no known cardiac history.  He is a former smoker.  States he is not exactly sure why he has these symptoms.  He is eating/drinking well.  No nausea, vomiting, diarrhea, or abdominal pain.  Denies current SI/HI/AVH.  Past Medical History:  Diagnosis Date  . Medical history non-contributory     Patient Active Problem List   Diagnosis Date Noted  . 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 MDD (major depressive disorder), recurrent, severe, with psychosis (HCC)   . Substance-induced psychotic disorder with delusions (HCC) 07/10/2016  . Major depressive disorder, single episode, severe with psychotic features (HCC) 07/04/2016    History reviewed. No pertinent surgical history.      Home Medications    Prior to Admission medications   Medication Sig Start Date End Date Taking?  Authorizing Provider  benztropine (COGENTIN) 0.5 MG tablet Take 1 tablet (0.5 mg total) by mouth daily. 08/07/18   Malvin Johns, MD  FLUoxetine (PROZAC) 20 MG capsule Take 1 capsule (20 mg total) by mouth daily. Patient not taking: Reported on 07/31/2018 07/12/16   Oneta Rack, NP  haloperidol (HALDOL) 5 MG tablet 1 in am 2 at hs 08/06/18   Malvin Johns, MD  haloperidol decanoate (HALDOL DECANOATE) 100 MG/ML injection Inject 1 mL (100 mg total) into the muscle every 30 (thirty) days. Due 09/04/18 09/04/18   Malvin Johns, MD  prazosin (MINIPRESS) 2 MG capsule Take 1 capsule (2 mg total) by mouth at bedtime. Patient not taking: Reported on 07/31/2018 07/11/16   Oneta Rack, NP  traZODone (DESYREL) 150 MG tablet Take 1 tablet (150 mg total) by mouth at bedtime as needed for sleep. 08/06/18   Malvin Johns, MD    Family History No family history on file.  Social History Social History   Tobacco Use  . Smoking status: Former Games developer  . Smokeless tobacco: Never Used  Substance Use Topics  . Alcohol use: No  . Drug use: Yes    Types: Marijuana     Allergies   Patient has no known allergies.   Review of Systems Review of Systems  Respiratory: Positive for shortness of breath.   Neurological: Positive for dizziness.     Physical Exam Updated Vital Signs BP (!) 131/98 (BP Location: Right Arm)   Pulse 64  Temp (!) 97.5 F (36.4 C) (Oral)   Resp 16   Ht 6' (1.829 m)   Wt 61.2 kg   SpO2 100%   BMI 18.31 kg/m   Physical Exam Vitals signs and nursing note reviewed.  Constitutional:      Appearance: He is well-developed.  HENT:     Head: Normocephalic and atraumatic.     Right Ear: Tympanic membrane and ear canal normal.     Left Ear: Tympanic membrane and ear canal normal.     Nose: Nose normal.     Mouth/Throat:     Lips: Pink.     Pharynx: Oropharynx is clear. Uvula midline.  Eyes:     Conjunctiva/sclera: Conjunctivae normal.     Pupils: Pupils are equal, round, and  reactive to light.  Neck:     Musculoskeletal: Normal range of motion.  Cardiovascular:     Rate and Rhythm: Normal rate and regular rhythm.     Heart sounds: Normal heart sounds.  Pulmonary:     Effort: Pulmonary effort is normal.     Breath sounds: Normal breath sounds.     Comments: Lungs clear, no wheezes or rhonchi, speaking in full sentences without difficulty Abdominal:     General: Bowel sounds are normal.     Palpations: Abdomen is soft.  Musculoskeletal: Normal range of motion.  Skin:    General: Skin is warm and dry.  Neurological:     Mental Status: He is alert and oriented to person, place, and time.     Comments: AAOx3, answering questions and following commands appropriately; equal strength UE and LE bilaterally; CN grossly intact; moves all extremities appropriately without ataxia; no focal neuro deficits or facial asymmetry appreciated  Psychiatric:     Comments: No SI/HI/AVH      ED Treatments / Results  Labs (all labs ordered are listed, but only abnormal results are displayed) Labs Reviewed - No data to display  EKG None  Radiology No results found.  Procedures Procedures (including critical care time)  Medications Ordered in ED Medications - No data to display   Initial Impression / Assessment and Plan / ED Course  I have reviewed the triage vital signs and the nursing notes.  Pertinent labs & imaging results that were available during my care of the patient were reviewed by me and considered in my medical decision making (see chart for details).  28 y.o. M here with complaint of feeling "jittery" and changes in breathing.  Recently hospitalized at [facility], started on multiple medications.  States he started noticing the breathing issue the day before he left [facility] after receiving injection-- it appears he was given haldol on this day.  States since he left that facility, he feels like at times he cannot sit still, yet other times he feels a little  lightheaded and dizzy.  No syncopal events.  States breathing just feels "off".  No chest pain, cough, fever, or other URI symptoms.  Here, VSS and he does not appear to be in any acute distress.  His lungs are clear without wheezes or rhonchi.  He is neurologically intact, ambulatory around ED with steady gait, drinking gatorade.  He does not appear hyperactive here and is not displaying any other true extrapyramidal symptoms (tardive dyskinesia, akathisia, muscle twitches/jerking).  No SI/HI/AVH.  Some of his symptoms may be due to initiation of numerous medications all at once as he reports he has not been on medications in several months per his report.  I have recommended that he follow-up with [facility] about this to see if medications need to be adjusted.  He can return here for any new/acute changes.    Final Clinical Impressions(s) / ED Diagnoses   Final diagnoses:  Jittery feeling  Breathing problem    ED Discharge Orders    None       Garlon Hatchet, PA-C 08/12/18 2300    Loren Racer, MD 08/13/18 [phone]

## 2018-08-12 NOTE — ED Triage Notes (Addendum)
Pt reports SOB and "woozy" feeling that has been for week.  Pt has no other symptoms.  After speaking to the pt, he states "after they gave me those shots in the ED, I have not been able to breath the same... feels different."  Does not appear to be in any respiratory distress.

## 2019-01-28 ENCOUNTER — Ambulatory Visit (INDEPENDENT_AMBULATORY_CARE_PROVIDER_SITE_OTHER): Payer: Medicaid Other | Admitting: Psychiatry

## 2019-01-28 ENCOUNTER — Other Ambulatory Visit: Payer: Self-pay

## 2019-01-28 DIAGNOSIS — F331 Major depressive disorder, recurrent, moderate: Secondary | ICD-10-CM

## 2019-01-28 DIAGNOSIS — F4312 Post-traumatic stress disorder, chronic: Secondary | ICD-10-CM

## 2019-01-28 DIAGNOSIS — F4310 Post-traumatic stress disorder, unspecified: Secondary | ICD-10-CM | POA: Insufficient documentation

## 2019-01-28 MED ORDER — TRAZODONE HCL 150 MG PO TABS: 150.0000 mg | ORAL_TABLET | Freq: Every evening | ORAL | 1 refills | Status: DC | PRN

## 2019-01-28 MED ORDER — FLUOXETINE HCL 20 MG PO CAPS: 20.0000 mg | ORAL_CAPSULE | Freq: Every day | ORAL | 0 refills | Status: DC
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
## 2019-02-03 ENCOUNTER — Telehealth (HOSPITAL_COMMUNITY): Payer: Self-pay

## 2019-02-03 NOTE — Telephone Encounter (Signed)
Patient's aunt called and stated that she picked up the patient's medication but he refuses to take it. She stated that he is very paranoid and thinks people are after him. She stated that he has regressed back to sleeping on the floor and stated that he said he wished he had a gun to protect everyone. She doesn't know what to do. Please review and advise. Thank you.

## 2019-02-03 NOTE — Telephone Encounter (Signed)
It looks like he needs to be back on inpatient unit and possibly be started on long acting IM antipsychotic - stopped taking oral Abilify after discharge. He may need to be committed if not willing to go voluntarily.

## 2019-02-04 ENCOUNTER — Encounter: Payer: Self-pay | Admitting: Psychiatry

## 2019-02-04 ENCOUNTER — Encounter (HOSPITAL_COMMUNITY): Payer: Self-pay | Admitting: Emergency Medicine

## 2019-02-04 ENCOUNTER — Other Ambulatory Visit: Payer: Self-pay

## 2019-02-04 ENCOUNTER — Observation Stay (HOSPITAL_COMMUNITY)
Admission: RE | Admit: 2019-02-04 | Discharge: 2019-02-05 | Disposition: A | Discharge status: Still In Hospital | Payer: Medicaid Other | Attending: Internal Medicine | Admitting: Internal Medicine

## 2019-02-04 DIAGNOSIS — Z20828 Contact with and (suspected) exposure to other viral communicable diseases: Secondary | ICD-10-CM | POA: Insufficient documentation

## 2019-02-04 DIAGNOSIS — Z79899 Other long term (current) drug therapy: Secondary | ICD-10-CM | POA: Insufficient documentation

## 2019-02-04 DIAGNOSIS — G4700 Insomnia, unspecified: Secondary | ICD-10-CM | POA: Insufficient documentation

## 2019-02-04 DIAGNOSIS — F200 Paranoid schizophrenia: Principal | ICD-10-CM | POA: Insufficient documentation

## 2019-02-04 DIAGNOSIS — F4310 Post-traumatic stress disorder, unspecified: Secondary | ICD-10-CM | POA: Insufficient documentation

## 2019-02-04 DIAGNOSIS — R45851 Suicidal ideations: Secondary | ICD-10-CM | POA: Insufficient documentation

## 2019-02-04 DIAGNOSIS — F319 Bipolar disorder, unspecified: Secondary | ICD-10-CM | POA: Insufficient documentation

## 2019-02-04 MED ORDER — OLANZAPINE 5 MG PO TABS: 5.0000 mg | ORAL_TABLET | Freq: Two times a day (BID) | ORAL | Status: DC

## 2019-02-04 MED ORDER — LORAZEPAM 1 MG PO TABS: 1.0000 mg | ORAL_TABLET | ORAL | Status: DC | PRN

## 2019-02-04 MED ORDER — OLANZAPINE 10 MG IM SOLR
10.0000 mg | Freq: Two times a day (BID) | INTRAMUSCULAR | Status: DC
  Filled 2019-02-04 (×2): qty 10

## 2019-02-04 MED ORDER — ZIPRASIDONE MESYLATE 20 MG IM SOLR: 20.0000 mg | Freq: Two times a day (BID) | INTRAMUSCULAR | Status: DC | PRN

## 2019-02-04 MED ORDER — HYDROXYZINE HCL 25 MG PO TABS: 25.0000 mg | ORAL_TABLET | Freq: Three times a day (TID) | ORAL | Status: DC | PRN

## 2019-02-04 MED ORDER — OLANZAPINE 10 MG PO TABS
10.0000 mg | ORAL_TABLET | Freq: Two times a day (BID) | ORAL | Status: DC
  Administered 2019-02-04 – 2019-02-05 (×2): 10 mg via ORAL
  Filled 2019-02-04 (×2): qty 1

## 2019-02-04 MED ORDER — DIPHENHYDRAMINE HCL 50 MG/ML IJ SOLN: 50.0000 mg | Freq: Once | INTRAMUSCULAR | Status: DC | PRN

## 2019-02-04 MED ORDER — ZIPRASIDONE MESYLATE 20 MG IM SOLR: 20.0000 mg | INTRAMUSCULAR | Status: DC | PRN

## 2019-02-04 MED ORDER — ALUM & MAG HYDROXIDE-SIMETH 200-200-20 MG/5ML PO SUSP: 30.0000 mL | ORAL | Status: DC | PRN

## 2019-02-04 MED ORDER — OLANZAPINE 10 MG IM SOLR: 5.0000 mg | Freq: Two times a day (BID) | INTRAMUSCULAR | Status: DC

## 2019-02-04 MED ORDER — MAGNESIUM HYDROXIDE 400 MG/5ML PO SUSP: 30.0000 mL | Freq: Every day | ORAL | Status: DC | PRN

## 2019-02-04 MED ORDER — ZIPRASIDONE MESYLATE 20 MG IM SOLR: 20.0000 mg | Freq: Once | INTRAMUSCULAR | Status: DC | PRN

## 2019-02-04 MED ORDER — OLANZAPINE 10 MG PO TBDP: 10.0000 mg | ORAL_TABLET | Freq: Three times a day (TID) | ORAL | Status: DC | PRN

## 2019-02-04 MED ORDER — LORAZEPAM 2 MG/ML IJ SOLN: 2.0000 mg | Freq: Once | INTRAMUSCULAR | Status: DC | PRN

## 2019-02-04 MED ORDER — ACETAMINOPHEN 325 MG PO TABS: 650.0000 mg | ORAL_TABLET | Freq: Four times a day (QID) | ORAL | Status: DC | PRN

## 2019-02-04 MED ORDER — TRAZODONE HCL 50 MG PO TABS: 50.0000 mg | ORAL_TABLET | Freq: Every evening | ORAL | Status: DC | PRN

## 2019-02-04 NOTE — Progress Notes (Signed)
Pt continues to refuse COVID testing.  Mask given for pt to wear, remains in room. Q 15 min checks in progress.  NP Lindon Romp and [facility] JoAnne aware.

## 2019-02-04 NOTE — BH Assessment (Signed)
[facility] Assessment Progress Note  Per Buford Dresser, DO, this pt requires psychiatric hospitalization.  Pt presents under IVC initiated by pt's aunt, and upheld by Dr Mariea Clonts.  Placement will be sought for pt, but first, Covid-19 test must be completed.  As of this writing, pt has persistently refused to allow this procedure to be completed.  For the time being, pt will remain in the [facility] Observation Unit.  Pt's nurse, Jan, has been notified, and agrees to contact this Probation officer if Covid situation changes.  Jalene Mullet, [place] Coordinator [phone]
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
## 2019-02-04 NOTE — Progress Notes (Signed)
Pt verbalized that he would allow writer to complete a covid test. Placed PPE to complete test and pt refused. Multiple attempts have been made and pt continues to refuse.

## 2019-02-04 NOTE — Progress Notes (Signed)
Patient ID: Jake Neal, male   DOB: 01-03-1991, 28 y.o.   MRN: [id number] Pt A&O x 4, presents with paranoid behavior, feels like people are watching him and other people may be out to harm him.  Pt refusing to take Rx medications.  Decreased sleep and food intake also as per the aunt.  Denies SI, HI.  Responding to internal stimuli.  No distress noted, adamantly refusing COVID test at present.  Pt requesting to leave facility.  NP Rashaun Dixon and House Coverage Supervisor Kim at bedside to speak with pt.  Skin search completed, pt changed into scrubs.  Monitoring for safety.  Pending IVC papers to taken out by Aunt.

## 2019-02-04 NOTE — Plan of Care (Signed)
[place] [facility] Crisis Plan  Reason for Crisis Plan:  Crisis Stabilization   Plan of Care:  Referral for Inpatient Hospitalization  Family Support:    yes  Current Living Environment:  Living Arrangements: Other relatives  Insurance:   Hospital Account    Name Acct ID Class Status Primary Coverage   Jake Neal, Jake Neal [id number] [place] Open None        Guarantor Account (for Hospital Account [id number])    Name Relation to Pt Service Area Active? Acct Type   Jake Neal A Self [facility] Yes Behavioral Health   Address Phone       [address] [place], [place] [address] [phone])          Coverage Information (for Hospital Account [id number])    Not on file      Legal Guardian:     Primary Care Provider:  System, Pcp Not In  Current Outpatient Providers:  Jake Merino, MD  Psychiatrist:  Name of Psychiatrist: Dr. Amalia Neal at [facility] outpatient  Counselor/Therapist:  Name of Therapist: None  Compliant with Medications:  No  Additional Information:   Jake Neal 9/11/20203:58 AM

## 2019-02-04 NOTE — Progress Notes (Signed)
Patient ID: Jake Neal, male   DOB: 08-Jun-1990, 28 y.o.   MRN: [id number] Pt A&O x 4, resting in bed at present.  Awake, alert & responsive, no distress noted.  Remains guarded and paranoid.  Monitoring for safety.
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
## 2019-02-04 NOTE — Progress Notes (Signed)
Patient ID: Jake Neal, male   DOB: [birth year], 28 y.o.   MRN: [id number] 02/04/2019 at 4,20 PM  Asked by Dr. Leilani Merl to see patient regarding a second opinion regarding medications over objection. Dr. Mariea Clonts informs me that  patient is currently under IVC and is being admitted to inpatient unit. He presented to hospital for worsening paranoia, auditory hallucinations,reporting that he feels people are watching him, out to harm him . He has been eating poorly, has been sleeping on the floor and only a few hours per night, has not taken medications due to paranoia.  As per Dr. Mariea Clonts, patient has refused medications and labs, including COVID test and called his aunt earlier expressing fear that staff intended to kill him. Patient has a history of psychiatric illness and has been diagnosed with Psychosis in the past, with a history of prior psychiatric admissions for paranoia /aggression towards family/medication non compliance. At this time patient presents alert, attentive, blunted/guarded/suspicious in presentation and  in affect, answers most questions with short phrases or monosyllables , denies SI or HI, denies hallucinations. Does state he does not think medications are needed and presents with limited insight.  Based on above , I concur with Dr. Mariea Clonts that improvement and ability to participate and benefit from treatment is less likely without the benefit of medication management.  Gabriel Earing MD

## 2019-02-04 NOTE — Progress Notes (Signed)
[employer] at bedside to serve IVC papers taken out by the Aunt.

## 2019-02-04 NOTE — Progress Notes (Signed)
D:Pt is paranoid in his room refusing EKG and pt refused covid testing. Pt says that he takes no medications and is focused on leaving the hospital. Pt verbally denies si and hi. When asked about hallucinations pt responded that he was but would not elaborate.  A:Offered support, encouragement and 15 minute checks.  R:Will continue to monitor. Safety maintained in the OBS unit.

## 2019-02-04 NOTE — Progress Notes (Signed)
Pt very guarded and not forthcoming with information for admission.  Pt in room at present, no distress noted, will continue to monitor.

## 2019-02-05 ENCOUNTER — Emergency Department (HOSPITAL_COMMUNITY)
Admission: EM | Admit: 2019-02-05 | Discharge: 2019-02-08 | Disposition: A | Discharge status: Other Acute Inpatient Hospital | Payer: Self-pay | Attending: Emergency Medicine | Admitting: Emergency Medicine

## 2019-02-05 ENCOUNTER — Other Ambulatory Visit: Payer: Self-pay

## 2019-02-05 ENCOUNTER — Encounter (HOSPITAL_COMMUNITY): Payer: Self-pay | Admitting: Emergency Medicine

## 2019-02-05 DIAGNOSIS — F29 Unspecified psychosis not due to a substance or known physiological condition: Secondary | ICD-10-CM

## 2019-02-05 DIAGNOSIS — Z87891 Personal history of nicotine dependence: Secondary | ICD-10-CM | POA: Insufficient documentation

## 2019-02-05 DIAGNOSIS — F200 Paranoid schizophrenia: Secondary | ICD-10-CM | POA: Diagnosis present

## 2019-02-05 DIAGNOSIS — R443 Hallucinations, unspecified: Secondary | ICD-10-CM | POA: Insufficient documentation

## 2019-02-05 LAB — URINALYSIS, COMPLETE (UACMP) WITH MICROSCOPIC
Bacteria, UA: NONE SEEN
Glucose, UA: NEGATIVE mg/dL
Hgb urine dipstick: NEGATIVE
Ketones, ur: 80 mg/dL — AB
Leukocytes,Ua: NEGATIVE
Nitrite: NEGATIVE
Protein, ur: 30 mg/dL — AB
Specific Gravity, Urine: 1.035 — ABNORMAL HIGH (ref 1.005–1.030)
pH: 5 (ref 5.0–8.0)

## 2019-02-05 LAB — RAPID URINE DRUG SCREEN, HOSP PERFORMED
Amphetamines: NOT DETECTED
Barbiturates: NOT DETECTED
Benzodiazepines: NOT DETECTED
Cocaine: NOT DETECTED
Opiates: NOT DETECTED
Tetrahydrocannabinol: NOT DETECTED

## 2019-02-05 LAB — SARS CORONAVIRUS 2 BY RT PCR (HOSPITAL ORDER, PERFORMED IN ~~LOC~~ HOSPITAL LAB): SARS Coronavirus 2: NEGATIVE

## 2019-02-05 NOTE — Progress Notes (Addendum)
Patient ID: Jake Neal, male   DOB: 09-Mar-1991, 28 y.o.   MRN: [id number]  Pt continues to be paranoid and psychotic. He believes the hospital staff are trying to harm him by requesting him to have a COVID test performed.  He has been taking his prescribed Zyprexa PO but has to have much encouragement to do so. Pt's aunt was contacted  He continues to refuse to be COVID tested. Inpatient placement is highly unlikely without this test result. Administration is aware of this issue. Administration will need to determine disposition of Pt if he continues to refuse COVID testing.   [name], PMHNP-BC 02/05/2019        1253  Patient seen face-to-face for psychiatric evaluation, chart reviewed and case discussed with the physician extender and developed treatment plan. Reviewed the information documented and agree with the treatment plan. Corena Pilgrim, MD

## 2019-02-05 NOTE — ED Triage Notes (Signed)
Patient from [facility], pending COVID to go to [place].

## 2019-02-05 NOTE — ED Notes (Signed)
Pt A&O x 4, calm & cooperative at present, pt under IVC, presents with Paranoia, hearing voices, thinking people are out to get him.  Noncompliant with meds.  Watching TV at present, monitoring for safety.

## 2019-02-05 NOTE — Progress Notes (Signed)
Patient gave verbal permission to contact his aunt for collateral information if needed. Aunt was contacted to assist staff encourage patient to comply with Covid test. Aunt is in agreement and states she will be here within the hour. Pt is in agreement with aunt coming to facility as well.

## 2019-02-05 NOTE — Progress Notes (Signed)
Writer was able to obtain Covid 19 test, urine sample and EGK with his aunt's assistance. Pt continues to refuse meals intake when offered "I don't trust y'all man". Drank 2 cups of apple juices with multiple verbal prompts / encouragement by his aunt. Pt pending transfer to [place] ([facility]), awaiting pick up by [employer]. Report called to Diane, RN in [place] and pt made aware. Safety checks maintained without incident at this time.

## 2019-02-05 NOTE — Progress Notes (Signed)
Pt taken off of shift report. [name] re-ordered consult in order for pt to reappear on shift report. Continued inpt recommended as pt does not have a bed at [facility] per [facility], RN. [name] to seek inpt placement.  Lind Covert, MSW, LCSW Therapeutic Triage Specialist  [phone]

## 2019-02-05 NOTE — Progress Notes (Signed)
Pt seen walking in the hall way without a mask. Writer asked pt to return to his room and put on a mask. Pt stood in the doorway and refused to put on a mask.

## 2019-02-05 NOTE — ED Notes (Signed)
There is not a note in the chart stating that the patient will be or has been accepted to [place] test negative. Will follow up with counselor and social worker for confirmation.

## 2019-02-05 NOTE — ED Provider Notes (Addendum)
[facility]-EMERGENCY DEPT Provider Note   CSN: [id number] Arrival date & time: 02/05/19  1419     History   Chief Complaint Chief Complaint  Patient presents with  . Behavioral    HPI Jake Neal is a 28 y.o. male.     Patient is a 28 year old male with a history of schizophrenia, PTSD who is being sent here from behavioral health due to medical clearance as he most likely will need inpatient placement.  Patient has had worsening psychotic break with more paranoia and delusions.  He believes the staff are trying to harm him and his aunt reports that with great persuasion he has occasionally taking his medications but does not want to.  He is supposed to be on Zyprexa.  Patient initially was repetitively refusing COVID testing however did have a complete yesterday 911 at 2 AM in the morning and it was negative.  Patient denies cough, fever, congestion or shortness of breath.  The history is provided by the patient (physician).    Past Medical History:  Diagnosis Date  . Medical history non-contributory     Patient Active Problem List   Diagnosis Date Noted  . Schizophrenia, paranoid type (HCC) 02/04/2019  . Chronic post-traumatic stress disorder (PTSD) 01/28/2019  . 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 Major depressive disorder, recurrent episode, moderate (HCC)   . 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 No pertinent surgical history.      Home Medications    Prior to Admission medications   Not on File    Family History No family history on file.  Social History Social History   Tobacco Use  . Smoking status: Former Games developermoker  . Smokeless tobacco: Never Used  Substance Use Topics  . Alcohol use: No  . Drug use: Yes    Types: Marijuana     Allergies   Patient has no known allergies.   Review of Systems Review of Systems  All other systems reviewed and are  negative.    Physical Exam Updated Vital Signs There were no vitals taken for this visit.  Physical Exam Vitals signs and nursing note reviewed.  Constitutional:      General: He is not in acute distress.    Appearance: He is well-developed.  HENT:     Head: Normocephalic and atraumatic.  Eyes:     Conjunctiva/sclera: Conjunctivae normal.     Pupils: Pupils are equal, round, and reactive to light.  Neck:     Musculoskeletal: Normal range of motion and neck supple.  Cardiovascular:     Rate and Rhythm: Normal rate and regular rhythm.     Heart sounds: No murmur.  Pulmonary:     Effort: Pulmonary effort is normal. No respiratory distress.     Breath sounds: Normal breath sounds. No wheezing or rales.  Abdominal:     General: There is no distension.     Palpations: Abdomen is soft.     Tenderness: There is no abdominal tenderness. There is no guarding or rebound.  Musculoskeletal: Normal range of motion.        General: No tenderness.  Skin:    General: Skin is warm and dry.     Findings: No erythema or rash.  Neurological:     Mental Status: He is alert and oriented to person, place, and time.  Psychiatric:        Mood and Affect: Affect is flat.  Speech: Speech is delayed.        Behavior: Behavior normal.        Thought Content: Thought content is paranoid. Thought content does not include homicidal or suicidal ideation.      ED Treatments / Results  Labs (all labs ordered are listed, but only abnormal results are displayed) Labs Reviewed - No data to display  EKG None  Radiology No results found.  Procedures Procedures (including critical care time)  Medications Ordered in ED Medications - No data to display   Initial Impression / Assessment and Plan / ED Course  I have reviewed the triage vital signs and the nursing notes.  Pertinent labs & imaging results that were available during my care of the patient were reviewed by me and considered in  my medical decision making (see chart for details).        Patient is a 28 year old male being sent over by behavioral health for clearance as they state patient is refusing COVID tasting.  Patient has a history of schizophrenia and has had worsening psychotic symptoms.  It is difficult to get him to take his antipsychotics and he thinks the staff are trying to harm him.  Psychiatry has evaluated the patient and feels that he needs to be inpatient.  He state he is refusing COVID testing and cannot get him placed.  However patient had his COVID test done on 02/04/2019 at 2 AM in the morning and it is negative. I contacted the behavioral health office to let them know the COVID test was done and has been negative.  They will look further into placement. Final Clinical Impressions(s) / ED Diagnoses   Final diagnoses:  Psychosis, unspecified psychosis type [facility])    ED Discharge Orders    None       Blanchie Dessert, MD 02/05/19 1517    Blanchie Dessert, MD 02/05/19 [phone]

## 2019-02-05 NOTE — Progress Notes (Signed)
Pt observed in bed awake on initial contact. Denies SI, HI and pain when assessed. Continues to be paranoid, suspicious, hypervigilant and argumentative / tangential on interactions. Reports others are out to kill him. Refused all PO intake when offered stated to writer "I don't trust y'all because y'all trying to kill me, why should I eat or drink anything y'all give me" you give me medications so you can do shit to me". Took his scheduled Zyprexa with increased encouragement / prompts.  Emotional support and availability provided to pt. Q 15 minutes safety checks maintained without self harm gestures or outburst to note. Encouraged pt to voice concerns.  Pt remains safe on unit. Continues to require multiple verbal redirections to stay in his room without a COVID-19 test.

## 2019-02-06 DIAGNOSIS — F200 Paranoid schizophrenia: Secondary | ICD-10-CM

## 2019-02-06 LAB — COMPREHENSIVE METABOLIC PANEL
ALT: 14 U/L (ref 0–44)
AST: 14 U/L — ABNORMAL LOW (ref 15–41)
Albumin: 4.5 g/dL (ref 3.5–5.0)
Alkaline Phosphatase: 52 U/L (ref 38–126)
Anion gap: 9 (ref 5–15)
BUN: 12 mg/dL (ref 6–20)
CO2: 28 mmol/L (ref 22–32)
Calcium: 9.6 mg/dL (ref 8.9–10.3)
Chloride: 101 mmol/L (ref 98–111)
Creatinine, Ser: 1.14 mg/dL (ref 0.61–1.24)
GFR calc Af Amer: >60 mL/min (ref >60)
GFR calc non Af Amer: >60 mL/min (ref >60)
Glucose, Bld: 92 mg/dL (ref 70–99)
Potassium: 4.2 mmol/L (ref 3.5–5.1)
Sodium: 138 mmol/L (ref 135–145)
Total Bilirubin: 1.7 mg/dL — ABNORMAL HIGH (ref 0.3–1.2)
Total Protein: 7.4 g/dL (ref 6.5–8.1)

## 2019-02-06 LAB — CBC WITH DIFFERENTIAL/PLATELET
Abs Immature Granulocytes: 0.01 10*3/uL (ref 0.00–0.07)
Basophils Absolute: 0 10*3/uL (ref 0.0–0.1)
Basophils Relative: 1 %
Eosinophils Absolute: 0.1 10*3/uL (ref 0.0–0.5)
Eosinophils Relative: 1 %
HCT: 44.8 % (ref 39.0–52.0)
Hemoglobin: 15 g/dL (ref 13.0–17.0)
Immature Granulocytes: 0 %
Lymphocytes Relative: 42 %
Lymphs Abs: 1.9 10*3/uL (ref 0.7–4.0)
MCH: 31.5 pg (ref 26.0–34.0)
MCHC: 33.5 g/dL (ref 30.0–36.0)
MCV: 94.1 fL (ref 80.0–100.0)
Monocytes Absolute: 0.4 10*3/uL (ref 0.1–1.0)
Monocytes Relative: 8 %
Neutro Abs: 2.2 10*3/uL (ref 1.7–7.7)
Neutrophils Relative %: 48 %
Platelets: 206 10*3/uL (ref 150–400)
RBC: 4.76 MIL/uL (ref 4.22–5.81)
RDW: 12.5 % (ref 11.5–15.5)
WBC: 4.5 10*3/uL (ref 4.0–10.5)
nRBC: 0 % (ref 0.0–0.2)

## 2019-02-06 LAB — TSH: TSH: 1.961 u[IU]/mL (ref 0.350–4.500)

## 2019-02-06 MED ORDER — OLANZAPINE 10 MG PO TBDP
10.0000 mg | ORAL_TABLET | Freq: Two times a day (BID) | ORAL | Status: DC
  Administered 2019-02-06 – 2019-02-08 (×5): 10 mg via ORAL
  Filled 2019-02-06 (×5): qty 1

## 2019-02-06 MED ORDER — HYDROXYZINE HCL 25 MG PO TABS: 50.0000 mg | ORAL_TABLET | Freq: Three times a day (TID) | ORAL | Status: DC | PRN

## 2019-02-06 NOTE — Progress Notes (Signed)
This patient continues to meet inpatient criteria. CSW faxed information to the following facilities:   [facility]- under review [facility]- available beds, will review [facility]- no beds available today, will review [facility]- potential bed availability, will review [facility]  [facility] reviewing for appropriate bed availability.  This patient is currently under IVC.  Stephanie Acre, LCSW-A Clinical Social Worker

## 2019-02-06 NOTE — ED Notes (Signed)
Pt sat up in chair at bedside 90% of the night, pt slept approximately 2 hours.

## 2019-02-06 NOTE — ED Notes (Signed)
Pt pacing in room.

## 2019-02-06 NOTE — Progress Notes (Signed)
Requested [facility] [name] to review patient for possible admission.  No bed availability at this time.  Will notify [facility] if one becomes available.

## 2019-02-06 NOTE — ED Notes (Signed)
Pt in bed. Quiet, slow to respond, guarded.

## 2019-02-06 NOTE — Consult Note (Addendum)
Telepsych Consultation   Reason for Consult:  Paranoia and delusions Referring Physician:  Dr Dwyane Dee Location of Patient:  Location of Provider: [facility]  Patient Identification: Jake Neal MRN:  [id number] Principal Diagnosis: Schizophrenia, paranoid type [facility]) Diagnosis:  Principal Problem:   Schizophrenia, paranoid type ([place])   Total Time spent with patient: 30 minutes  Subjective:   Jake Neal is a 28 y.o. male patient admitted with paranoia and delusions.  HPI:  Pt was seen and chart reviewed with treatment team and Dr Darleene Cleaver. Pt lives with his aunt. When he was admitted to OBS, she reported that he had become very paranoid, stopped taking his medications, and was not attending to his personal hygiene.  Today during evaluation:  Pt continues to be paranoid and guarded. He is sitting on his bed, dressed in purple scrubs and staring straight ahead at the camera. He stated he did not sleep last night because he was not tired. He had not eaten his breakfast, he stated he doesn't think he should. Pt was on the OBS unit for 2 days and then transferred to the [facility] secure unit on 9/12,  due to staffing. Pt has been taking his Zyprexa 10 mg PO since 9/11. He appears to be somewhat less paranoid today but still is guarded with a blunt affect. He has not been aggressive or agitated while at [facility]. He is calm and cooperative today during evaluation. He had no questions today and did not ask when he could go home. Pt's UDS is negative. Pt continues to be recommended for an inpatient admission for stabilization. It is possible that Pt may stabilize before a bed is located and if that is the case he would be up for discharge. Psychiatry will continue to monitor.     Past Psychiatric History: As above  Risk to Self:  No Risk to Others:  No Prior Inpatient Therapy:  Yes Prior Outpatient Therapy:  Unknown  Past Medical History:  Past Medical History:  Diagnosis Date  .  Medical history non-contributory    History reviewed. No pertinent surgical history. Family History: No family history on file. Family Psychiatric  History: Pt unable to provide this information Social History:  Social History   Substance and Sexual Activity  Alcohol Use No     Social History   Substance and Sexual Activity  Drug Use Yes  . Types: Marijuana    Social History   Socioeconomic History  . Marital status: Single    Spouse name: Not on file  . Number of children: Not on file  . Years of education: Not on file  . Highest education level: Not on file  Occupational History  . Not on file  Social Needs  . Financial resource strain: Not on file  . Food insecurity    Worry: Not on file    Inability: Not on file  . Transportation needs    Medical: Not on file    Non-medical: Not on file  Tobacco Use  . Smoking status: Former Research scientist (life sciences)  . Smokeless tobacco: Never Used  Substance and Sexual Activity  . Alcohol use: No  . Drug use: Yes    Types: Marijuana  . Sexual activity: Yes    Birth control/protection: None  Lifestyle  . Physical activity    Days per week: Not on file    Minutes per session: Not on file  . Stress: Not on file  Relationships  . Social connections    Talks  on phone: Not on file    Gets together: Not on file    Attends religious service: Not on file    Active member of club or organization: Not on file    Attends meetings of clubs or organizations: Not on file    Relationship status: Not on file  Other Topics Concern  . Not on file  Social History Narrative  . Not on file   Additional Social History:    Allergies:  No Known Allergies  Labs: No results found for this or any previous visit (from the past 48 hour(s)).  Medications:  Current Facility-Administered Medications  Medication Dose Route Frequency Provider Last Rate Last Dose  . hydrOXYzine (ATARAX/VISTARIL) tablet 50 mg  50 mg Oral TID PRN Loretta Kluender, MD      .  OLANZapine zydis (ZYPREXA) disintegrating tablet 10 mg  10 mg Oral BID Dyani Babel, MD   10 mg at 02/06/19 1005   No current outpatient medications on file.    Musculoskeletal: Strength & Muscle Tone: within normal limits Gait & Station: normal Patient leans: N/A  Psychiatric Specialty Exam: Physical Exam  Constitutional: He appears well-developed and well-nourished.  HENT:  Head: Normocephalic.  Respiratory: Effort normal.  Musculoskeletal: Normal range of motion.  Neurological: He is alert.  Psychiatric: His speech is normal and behavior is normal. Judgment normal. His mood appears anxious. Thought content is paranoid and delusional. Cognition and memory are normal.    Review of Systems  Psychiatric/Behavioral: Positive for hallucinations.  All other systems reviewed and are negative.   Blood pressure 113/74, pulse 72, temperature 98.2 F (36.8 C), temperature source Oral, resp. rate 14, SpO2 99 %.There is no height or weight on file to calculate BMI.  General Appearance: Casual, dressed in purple scrubs. Long hair but appears clean and neat  Eye Contact:  Fair  Speech:  Blocked  Volume:  Decreased  Mood:  Anxious and pacing at times  Affect:  Blunt and guarded  Thought Process:  Disorganized and Descriptions of Associations: Loose  Orientation:  Full (Time, Place, and Person)  Thought Content:  Illogical, Delusions and Ideas of Reference:   Paranoia  Suicidal Thoughts:  No, denies  Homicidal Thoughts:  No, denies  Memory:  Immediate;   Fair Recent;   Fair Remote;   Fair  Judgement:  Poor  Insight:  Shallow  Psychomotor Activity:  Normal, can be restless at times  Concentration:  Concentration: Fair and Attention Span: Fair  Recall:  [employer] of Knowledge:  Fair  Language:  Good  Akathisia:  Negative  Handed:  Right  AIMS (if indicated):     Assets:  Architect Housing Social Support  ADL's:  Intact  Cognition:   WNL  Sleep:        Treatment Plan Summary: Daily contact with patient to assess and evaluate symptoms and progress in treatment and Medication management   Zyprexa Zydis 10 mg PO BID for stabilization of mood and delusions/paranoia Vistaril 50 mg PO TID PRN for anxiety  Disposition: Recommend psychiatric Inpatient admission when medically cleared. [name] to seek placement  This service was provided via telemedicine using a 2-way, interactive audio and video technology.  Names of all persons participating in this telemedicine service and their role in this encounter. Name: Verlin Dike Role: Patient  Name: Elta Guadeloupe Role: PMHNP  Name: Thedore Mins, MD Role: Psychiatrist  Name:  Role:    Laveda Abbe, NP 02/06/2019 2:03 PM Patient  seen face-to-face for psychiatric evaluation, chart reviewed and case discussed with the physician extender and developed treatment plan. Reviewed the information documented and agree with the treatment plan. Thedore MinsMojeed Jackelin Correia, MD

## 2019-02-06 NOTE — ED Notes (Signed)
Pt sleeping at present, no distress noted, calm & cooperative at present.  Easily arouseable to verbal stimuli.  Monitoring for safety.

## 2019-02-07 ENCOUNTER — Encounter (HOSPITAL_COMMUNITY): Payer: Self-pay | Admitting: Registered Nurse

## 2019-02-07 DIAGNOSIS — F29 Unspecified psychosis not due to a substance or known physiological condition: Secondary | ICD-10-CM

## 2019-02-07 MED ORDER — HYDROXYZINE HCL 50 MG PO TABS: 50.0000 mg | ORAL_TABLET | Freq: Three times a day (TID) | ORAL | 0 refills | Status: DC | PRN

## 2019-02-07 MED ORDER — OLANZAPINE 10 MG PO TBDP: 10.0000 mg | ORAL_TABLET | Freq: Two times a day (BID) | ORAL | 0 refills | Status: DC

## 2019-02-07 NOTE — Care Management (Signed)
  Per Lindon Romp, NP - inpatient recommended.    Patient is under review at the following facilities:  [facility], [facility], [facility], [facility], [facility], [place] [facility], [facility], [facility] [place], [facility], [facility], [facility], [facility], [facility], [facility], [place], [place],

## 2019-02-07 NOTE — Discharge Summary (Addendum)
  Patient is to be transferred to [facility] for inpatient psychiatric treatment  Patient seen by telemedicine for psychiatric evaluation, chart reviewed and case discussed with the physician extender and developed treatment plan. Reviewed the information documented and agree with the treatment plan.  Buford Dresser, DO 02/07/19 11:21 PM

## 2019-02-07 NOTE — ED Notes (Signed)
Pt did not eat breakfast or lunch.  He did drink one apple juice when he took his medication.

## 2019-02-07 NOTE — ED Notes (Signed)
PT IS ACCEPTED TO [facility]; [address] [place], [place] [address].  DR. Cynda Familia IS ACCEPTING, [id number].  CALL REPORT TO [phone].

## 2019-02-07 NOTE — BH Assessment (Signed)
[facility] Assessment Progress Note  Per Buford Dresser, DO, this pt continues to require psychiatric hospitalization at this time.  Pt remains under IVC initiated by pt's aunt and upheld by Dr Mariea Clonts.  The following facilities have been contacted to seek placement for this pt, with results as noted:  Beds available, information sent, decision pending:  [facility]   At capacity:  Ginny Forth, [place] Coordinator [phone]

## 2019-02-07 NOTE — ED Notes (Signed)
Part "C" of the "Findings and Custody Order of the Involuntary Commitment had been filled out by [employer] when pt was transferred from [facility] O[facility] unit to [facility].  This necessitated redoing the IVC so that [facility] would be able to fill out part "C" when they transport pt to [facility]. This writer attempted to call [facility] but there was not answer. This writer left our number for a call back.

## 2019-02-07 NOTE — Consult Note (Addendum)
Telepsych Consultation   Reason for Consult:  Suicidal ideation Referring Physician:  EDP Location of Patient: [facility] Location of Provider: [facility]  Patient Identification: Jake Neal MRN:  [id number] Principal Diagnosis: Psychosis [facility]) Diagnosis:  Principal Problem:   Schizophrenia, paranoid type (HCC)   Total Time spent with patient: 30 minutes  Subjective:   Jake Neal is a 28 y.o. male patient presents with complaints of paranoia.  Per Psych  Consult Note:  Reviewed by this provider:  Pt was seen and chart reviewed with treatment team and Dr Jannifer Franklin. Pt lives with his aunt. When he was admitted to [facility] OBS, she reported that he had become very paranoid, stopped taking his medications, and was not attending to his personal hygiene.  Today during evaluation:  Pt continues to be paranoid and guarded. He is sitting on his bed, dressed in purple scrubs and staring straight ahead at the camera. He stated he did not sleep last night because he was not tired. He had not eaten his breakfast, he stated he doesn't think he should. Pt was on the OBS unit for 2 days and then transferred to the [facility] secure unit on 9/12,  due to staffing. Pt has been taking his Zyprexa 10 mg PO since 9/11. He appears to be somewhat less paranoid today but still is guarded with a blunt affect. He has not been aggressive or agitated while at [facility]. He is calm and cooperative today during evaluation. He had no questions today and did not ask when he could go home. Pt's UDS is negative. Pt continues to be recommended for an inpatient admission for stabilization. It is possible that Pt may stabilize before a bed is located and if that is the case he would be up for discharge. Psychiatry will continue to monitor.   HPI:  [name], 28 y.o., male patient seen via tele psych by this provider, Dr. Sharma Covert; and chart reviewed on 02/07/19.  On evaluation Jake Neal reports he is feeling better,  sleeping, and eating.  Patient lives with his aunt but hasn't spoken to her since admission.  Patient denies suicidal/homicidal ideation, psychosis, and paranoia.  Patient states that he is ready to go home. Patient informed that he would need to talk to his aunt before he could be discharged so that she could determine if he is near his baseline.   During evaluation PANFILO SIBAJA is alert/oriented x 4; calm/cooperative; and mood is congruent with affect.  He does not appear to be responding to internal/external stimuli or delusional thoughts.  Patient denies suicidal/self-harm/homicidal ideation, psychosis, and paranoia.  Patient answered question appropriately.     Past Psychiatric History: Denies prior psych history  Risk to Self:  Denies SI.  Risk to Others:  Denies HI.  Prior Inpatient Therapy:  History of multiple hospitalizations and was last admitted to [facility] in 07/2018.  Prior Outpatient Therapy:  He is followed by Dr. Donell Beers.   Past Medical History:  Past Medical History:  Diagnosis Date  . Medical history non-contributory    History reviewed. No pertinent surgical history. Family History: History reviewed. No pertinent family history. Family Psychiatric  History: Denies Social History:  Social History   Substance and Sexual Activity  Alcohol Use No     Social History   Substance and Sexual Activity  Drug Use Yes  . Types: Marijuana    Social History   Socioeconomic History  . Marital status: Single    Spouse name:  Not on file  . Number of children: Not on file  . Years of education: Not on file  . Highest education level: Not on file  Occupational History  . Not on file  Social Needs  . Financial resource strain: Not on file  . Food insecurity    Worry: Not on file    Inability: Not on file  . Transportation needs    Medical: Not on file    Non-medical: Not on file  Tobacco Use  . Smoking status: Former Games developermoker  . Smokeless tobacco: Never Used  Substance  and Sexual Activity  . Alcohol use: No  . Drug use: Yes    Types: Marijuana  . Sexual activity: Yes    Birth control/protection: None  Lifestyle  . Physical activity    Days per week: Not on file    Minutes per session: Not on file  . Stress: Not on file  Relationships  . Social Musicianconnections    Talks on phone: Not on file    Gets together: Not on file    Attends religious service: Not on file    Active member of club or organization: Not on file    Attends meetings of clubs or organizations: Not on file    Relationship status: Not on file  Other Topics Concern  . Not on file  Social History Narrative  . Not on file   Additional Social History: N/A    Allergies:  No Known Allergies  Labs:  Results for orders placed or performed during the hospital encounter of 02/05/19 (from the past 48 hour(s))  CBC with Differential/Platelet     Status: None   Collection Time: 02/06/19  3:32 PM  Result Value Ref Range   WBC 4.5 4.0 - 10.5 K/uL   RBC 4.76 4.22 - 5.81 MIL/uL   Hemoglobin 15.0 13.0 - 17.0 g/dL   HCT 16.144.8 09.639.0 - 04.552.0 %   MCV 94.1 80.0 - 100.0 fL   MCH 31.5 26.0 - 34.0 pg   MCHC 33.5 30.0 - 36.0 g/dL   RDW 40.912.5 81.111.5 - 91.415.5 %   Platelets 206 150 - 400 K/uL   nRBC 0.0 0.0 - 0.2 %   Neutrophils Relative % 48 %   Neutro Abs 2.2 1.7 - 7.7 K/uL   Lymphocytes Relative 42 %   Lymphs Abs 1.9 0.7 - 4.0 K/uL   Monocytes Relative 8 %   Monocytes Absolute 0.4 0.1 - 1.0 K/uL   Eosinophils Relative 1 %   Eosinophils Absolute 0.1 0.0 - 0.5 K/uL   Basophils Relative 1 %   Basophils Absolute 0.0 0.0 - 0.1 K/uL   Immature Granulocytes 0 %   Abs Immature Granulocytes 0.01 0.00 - 0.07 K/uL    Comment: Performed at [facility], [address] [place], [place] [address]  Comprehensive metabolic panel     Status: Abnormal   Collection Time: 02/06/19  3:32 PM  Result Value Ref Range   Sodium 138 135 - 145 mmol/L   Potassium 4.2 3.5 - 5.1 mmol/L   Chloride 101 98 -  111 mmol/L   CO2 28 22 - 32 mmol/L   Glucose, Bld 92 70 - 99 mg/dL   BUN 12 6 - 20 mg/dL   Creatinine, Ser 6.211.14 0.61 - 1.24 mg/dL   Calcium 9.6 8.9 - 30.810.3 mg/dL   Total Protein 7.4 6.5 - 8.1 g/dL   Albumin 4.5 3.5 - 5.0 g/dL   AST 14 (L) 15 -  41 U/L   ALT 14 0 - 44 U/L   Alkaline Phosphatase 52 38 - 126 U/L   Total Bilirubin 1.7 (H) 0.3 - 1.2 mg/dL   GFR calc non Af Amer >60 >60 mL/min   GFR calc Af Amer >60 >60 mL/min   Anion gap 9 5 - 15    Comment: Performed at [facility], [place] [address] [place], [place] [address]  TSH     Status: None   Collection Time: 02/06/19  3:32 PM  Result Value Ref Range   TSH 1.961 0.350 - 4.500 uIU/mL    Comment: Performed by a 3rd Generation assay with a functional sensitivity of <=0.01 uIU/mL. Performed at [facility], [place] [address], [place], [place] [address]     Medications:  Current Facility-Administered Medications  Medication Dose Route Frequency Provider Last Rate Last Dose  . hydrOXYzine (ATARAX/VISTARIL) tablet 50 mg  50 mg Oral TID PRN Darleene Cleaver, Mojeed, MD      . OLANZapine zydis (ZYPREXA) disintegrating tablet 10 mg  10 mg Oral BID Darleene Cleaver, Mojeed, MD   10 mg at 02/07/19 1129   No current outpatient medications on file.    Musculoskeletal: Strength & Muscle Tone: within normal limits Gait & Station: normal Patient leans: N/A  Psychiatric Specialty Exam: Physical Exam  Nursing note and vitals reviewed. Constitutional: He is oriented to person, place, and time. He appears well-nourished. No distress.  Neck: Normal range of motion.  Respiratory: Effort normal.  Musculoskeletal: Normal range of motion.  Neurological: He is alert and oriented to person, place, and time.  Psychiatric: Thought content normal. His mood appears anxious. He is withdrawn. Cognition and memory are normal. He expresses impulsivity. He exhibits a depressed mood.    Review of Systems  Psychiatric/Behavioral:  Positive for depression, substance abuse and suicidal ideas. The patient is nervous/anxious.   All other systems reviewed and are negative.   Blood pressure 100/60, pulse (!) 102, temperature 98.3 F (36.8 C), temperature source Oral, resp. rate 16, SpO2 100 %.There is no height or weight on file to calculate BMI.  General Appearance: Casual  Eye Contact:  Good  Speech:  Clear and Coherent and Normal Rate  Volume:  Normal  Mood:  Anxious  Affect:  Congruent  Thought Process:  Coherent, Goal Directed and Descriptions of Associations: Intact  Orientation:  Full (Time, Place, and Person)  Thought Content:  WDL  Suicidal Thoughts:  No  Homicidal Thoughts:  No  Memory:  Immediate;   Good Recent;   Good Remote;   Good  Judgement:  Other:  Improving  Insight:  Present  Psychomotor Activity:  Normal  Concentration:  Concentration: Good and Attention Span: Good  Recall:  Good  Fund of Knowledge:  Good  Language:  Fair  Akathisia:  No  Handed:  Right  AIMS (if indicated):   N/A  Assets:  Communication Skills Desire for Improvement Housing Physical Health Social Support  ADL's:  Intact  Cognition:  WNL  Sleep:   N/A     Treatment Plan Summary: Daily contact with patient to assess and evaluate symptoms and progress in treatment, Medication management and Plan Continue to seek inpatient psychiatric treatment  Disposition: Recommend psychiatric Inpatient admission when medically cleared.    This service was provided via telemedicine using a 2-way, interactive audio and video technology.  Names of all persons participating in this telemedicine service and their role in this encounter. Name: [name] Role: NP  Name:  Dr. Sharma Covert Role: Psychiatrist  Name: Verlin DikeJaquan Simkin Role: Patient    Assunta FoundShuvon Rankin, NP 02/07/2019 3:30 PM  Patient seen by telemedicine for psychiatric evaluation, chart reviewed and case discussed with the physician extender and developed treatment plan.  Reviewed the information documented and agree with the treatment plan.  Juanetta BeetsJacqueline , DO 02/07/19 11:31 PM

## 2019-02-07 NOTE — ED Provider Notes (Signed)
Nursing staff advises that the patient has been evaluated for inpatient psychiatric placement under IVC condition.  His IVC paperwork requires revision.  I have reviewed notes and patient's IVC conditions.  IVC paperwork redone and submitted.  Patient is alert and appropriate.  He is resting and cooperative at this time.  He has no complaints and no acute distress.   Charlesetta Shanks, MD 02/07/19 [phone]

## 2019-02-07 NOTE — ED Notes (Signed)
Pt ate all of his chicken and rice and some applesauce on the side.

## 2019-02-08 NOTE — ED Notes (Signed)
Pt off unit to [facility] per provider.  Pt IVC , alert, calm, cooperative, no s/s of distress. DC information given to sheriff for [facility]. Pt belongings given sheriff for [facility]. Pt ambulatory off unit, escorted by sheriff. Pt transported by sheriff.

## 2019-02-08 NOTE — Progress Notes (Addendum)
Received Jake Neal last PM at the change of shift awake in his bed watching TV. Later he received a snack of his choice and was compliant with his medication. He is somewhat withdrawn with very few words. He slept throughout the night without incident.  A copy of his Covid test was faxed to  [facility] per their request.

## 2019-02-21 ENCOUNTER — Telehealth (HOSPITAL_COMMUNITY): Payer: Self-pay | Admitting: Psychiatry

## 2019-02-22 ENCOUNTER — Ambulatory Visit (HOSPITAL_COMMUNITY): Payer: Medicaid Other | Admitting: Psychiatry

## 2019-02-24 ENCOUNTER — Ambulatory Visit (HOSPITAL_COMMUNITY): Payer: Medicaid Other | Admitting: Psychiatry

## 2019-02-28 ENCOUNTER — Ambulatory Visit (HOSPITAL_COMMUNITY): Payer: Medicaid Other | Admitting: Psychiatry

## 2019-02-28 ENCOUNTER — Ambulatory Visit (INDEPENDENT_AMBULATORY_CARE_PROVIDER_SITE_OTHER): Payer: Self-pay | Admitting: Psychiatry

## 2019-02-28 ENCOUNTER — Telehealth (HOSPITAL_COMMUNITY): Payer: Self-pay

## 2019-02-28 DIAGNOSIS — F200 Paranoid schizophrenia: Secondary | ICD-10-CM

## 2019-02-28 DIAGNOSIS — F4312 Post-traumatic stress disorder, chronic: Secondary | ICD-10-CM

## 2019-02-28 MED ORDER — HYDROXYZINE HCL 50 MG PO TABS: 50.0000 mg | ORAL_TABLET | Freq: Three times a day (TID) | ORAL | 0 refills | Status: DC | PRN

## 2019-02-28 MED ORDER — MIRTAZAPINE 7.5 MG PO TABS: 7.5000 mg | ORAL_TABLET | Freq: Every day | ORAL | 2 refills | Status: DC

## 2019-02-28 MED ORDER — ARIPIPRAZOLE ER 400 MG IM PRSY: 400.0000 mg | PREFILLED_SYRINGE | INTRAMUSCULAR | 5 refills | Status: DC

## 2019-02-28 MED ORDER — SERTRALINE HCL 100 MG PO TABS: 100.0000 mg | ORAL_TABLET | Freq: Every day | ORAL | 2 refills | Status: DC
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
## 2019-02-28 NOTE — Telephone Encounter (Signed)
Thank you :)

## 2019-02-28 NOTE — Addendum Note (Signed)
Addended by: Maudry Mayhew on: 02/28/2019 04:23 PM   Modules accepted: Orders

## 2019-02-28 NOTE — Telephone Encounter (Signed)
Patient was discharged from the hospital last Wednesday, he was discharged on Sertraline 100 mg, mirtazapine 7.5 mg, and hydroxyzine 25 mg as needed. Patient had a Abilify shot on Tuesday 9/30 and will be due for the next one on 10/30. You have an appointment with him this afternoon.

## 2019-05-04 ENCOUNTER — Other Ambulatory Visit (HOSPITAL_COMMUNITY): Payer: Self-pay | Admitting: *Deleted

## 2019-05-12 ENCOUNTER — Ambulatory Visit (INDEPENDENT_AMBULATORY_CARE_PROVIDER_SITE_OTHER): Payer: Medicaid Other | Admitting: Psychiatry

## 2019-05-12 ENCOUNTER — Other Ambulatory Visit: Payer: Self-pay

## 2019-05-12 DIAGNOSIS — F200 Paranoid schizophrenia: Secondary | ICD-10-CM

## 2019-05-12 DIAGNOSIS — F4312 Post-traumatic stress disorder, chronic: Secondary | ICD-10-CM
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
## 2019-05-13 ENCOUNTER — Telehealth (HOSPITAL_COMMUNITY): Payer: Self-pay

## 2019-05-13 NOTE — Telephone Encounter (Signed)
I spoke with patient's aunt and she would like him to continue taking the Abilify Maintena injection. She stated that he's getting bad and really needs it. He doesn't have insurance right now but patient's aunt is working on it. I've spoken with the pharmacy and it will cost $1,000 - $2,000 to pay for the injection. I then reached out to [facility] on behalf of the patient and they sent me paperwork to send to the patient in order to get the injection covered for free. I also spoke with the Abilify Rep and she stated that she will supply [place] with samples for him until a determination is made from the foundation. Would you like him to start the injection and if so when would you like him to start and how often would you like him to get it? Please review and advise. Thank you.

## 2019-05-30 ENCOUNTER — Other Ambulatory Visit (HOSPITAL_COMMUNITY): Payer: Self-pay | Admitting: *Deleted

## 2019-05-30 MED ORDER — MIRTAZAPINE 7.5 MG PO TABS: 7.5000 mg | ORAL_TABLET | Freq: Every day | ORAL | 2 refills | Status: DC

## 2019-05-30 MED ORDER — HYDROXYZINE HCL 50 MG PO TABS: 50.0000 mg | ORAL_TABLET | Freq: Three times a day (TID) | ORAL | 0 refills | Status: DC | PRN

## 2019-05-30 MED ORDER — SERTRALINE HCL 100 MG PO TABS: 100.0000 mg | ORAL_TABLET | Freq: Every day | ORAL | 2 refills | Status: DC

## 2019-06-04 ENCOUNTER — Other Ambulatory Visit (HOSPITAL_COMMUNITY): Payer: Self-pay | Admitting: Psychiatry

## 2019-06-04 MED ORDER — ARIPIPRAZOLE ER 400 MG IM PRSY: 400.0000 mg | PREFILLED_SYRINGE | INTRAMUSCULAR | Status: DC

## 2019-06-06 ENCOUNTER — Ambulatory Visit (HOSPITAL_COMMUNITY): Payer: Self-pay

## 2019-06-07 ENCOUNTER — Ambulatory Visit (HOSPITAL_COMMUNITY): Payer: Self-pay

## 2019-06-08 ENCOUNTER — Ambulatory Visit (HOSPITAL_COMMUNITY): Payer: Self-pay

## 2019-09-16 ENCOUNTER — Emergency Department (HOSPITAL_COMMUNITY): Payer: Medicaid Other

## 2019-09-16 ENCOUNTER — Other Ambulatory Visit: Payer: Self-pay

## 2019-09-16 ENCOUNTER — Emergency Department (HOSPITAL_COMMUNITY)
Admission: EM | Admit: 2019-09-16 | Discharge: 2019-09-17 | Disposition: A | Discharge status: Home / Self Care | Payer: Medicaid Other | Attending: Emergency Medicine | Admitting: Emergency Medicine

## 2019-09-16 DIAGNOSIS — Z87891 Personal history of nicotine dependence: Secondary | ICD-10-CM | POA: Insufficient documentation

## 2019-09-16 DIAGNOSIS — R1030 Lower abdominal pain, unspecified: Secondary | ICD-10-CM | POA: Insufficient documentation

## 2019-09-16 DIAGNOSIS — K5900 Constipation, unspecified: Secondary | ICD-10-CM | POA: Insufficient documentation

## 2019-09-16 DIAGNOSIS — R112 Nausea with vomiting, unspecified: Secondary | ICD-10-CM | POA: Diagnosis not present

## 2019-09-16 DIAGNOSIS — E860 Dehydration: Secondary | ICD-10-CM | POA: Insufficient documentation

## 2019-09-16 LAB — CBC
HCT: 51.5 % (ref 39.0–52.0)
Hemoglobin: 16.8 g/dL (ref 13.0–17.0)
MCH: 30.9 pg (ref 26.0–34.0)
MCHC: 32.6 g/dL (ref 30.0–36.0)
MCV: 94.8 fL (ref 80.0–100.0)
Platelets: 179 10*3/uL (ref 150–400)
RBC: 5.43 MIL/uL (ref 4.22–5.81)
RDW: 13.2 % (ref 11.5–15.5)
WBC: 9.4 10*3/uL (ref 4.0–10.5)
nRBC: 0 % (ref 0.0–0.2)

## 2019-09-16 LAB — BASIC METABOLIC PANEL
Anion gap: 23 — ABNORMAL HIGH (ref 5–15)
BUN: 18 mg/dL (ref 6–20)
CO2: 16 mmol/L — ABNORMAL LOW (ref 22–32)
Calcium: 10.1 mg/dL (ref 8.9–10.3)
Chloride: 100 mmol/L (ref 98–111)
Creatinine, Ser: 1.77 mg/dL — ABNORMAL HIGH (ref 0.61–1.24)
GFR calc Af Amer: 60 mL/min — ABNORMAL LOW (ref >60)
GFR calc non Af Amer: 51 mL/min — ABNORMAL LOW (ref >60)
Glucose, Bld: 111 mg/dL — ABNORMAL HIGH (ref 70–99)
Potassium: 4.7 mmol/L (ref 3.5–5.1)
Sodium: 139 mmol/L (ref 135–145)

## 2019-09-16 LAB — TROPONIN I (HIGH SENSITIVITY): Troponin I (High Sensitivity): 10 ng/L (ref <18)

## 2019-09-16 NOTE — ED Triage Notes (Addendum)
Pt c/o CP that occurred after drinking soda and then he vomited. Also c/o SOB and weakness. States he hasn't eaten in days.

## 2019-09-17 ENCOUNTER — Other Ambulatory Visit: Payer: Self-pay

## 2019-09-17 ENCOUNTER — Encounter (HOSPITAL_COMMUNITY): Payer: Self-pay | Admitting: Psychiatry

## 2019-09-17 ENCOUNTER — Emergency Department (HOSPITAL_COMMUNITY): Payer: Medicaid Other

## 2019-09-17 ENCOUNTER — Encounter (HOSPITAL_COMMUNITY): Payer: Self-pay | Admitting: Radiology

## 2019-09-17 ENCOUNTER — Observation Stay (HOSPITAL_COMMUNITY)
Admission: AD | Admit: 2019-09-17 | Discharge: 2019-09-18 | Disposition: A | Discharge status: Home / Self Care | Payer: Medicaid Other | Attending: Psychiatry | Admitting: Psychiatry

## 2019-09-17 DIAGNOSIS — Z20822 Contact with and (suspected) exposure to covid-19: Secondary | ICD-10-CM | POA: Diagnosis not present

## 2019-09-17 DIAGNOSIS — F4310 Post-traumatic stress disorder, unspecified: Secondary | ICD-10-CM | POA: Insufficient documentation

## 2019-09-17 DIAGNOSIS — Z590 Homelessness: Secondary | ICD-10-CM | POA: Diagnosis not present

## 2019-09-17 DIAGNOSIS — Z79899 Other long term (current) drug therapy: Secondary | ICD-10-CM | POA: Diagnosis not present

## 2019-09-17 DIAGNOSIS — F329 Major depressive disorder, single episode, unspecified: Secondary | ICD-10-CM | POA: Insufficient documentation

## 2019-09-17 DIAGNOSIS — R45851 Suicidal ideations: Secondary | ICD-10-CM | POA: Diagnosis not present

## 2019-09-17 DIAGNOSIS — F200 Paranoid schizophrenia: Secondary | ICD-10-CM | POA: Diagnosis present

## 2019-09-17 DIAGNOSIS — Z87891 Personal history of nicotine dependence: Secondary | ICD-10-CM | POA: Diagnosis not present

## 2019-09-17 LAB — RESPIRATORY PANEL BY RT PCR (FLU A&B, COVID)
Influenza A by PCR: NEGATIVE
Influenza B by PCR: NEGATIVE
SARS Coronavirus 2 by RT PCR: NEGATIVE

## 2019-09-17 LAB — HEPATIC FUNCTION PANEL
ALT: 12 U/L (ref 0–44)
AST: 23 U/L (ref 15–41)
Albumin: 4.6 g/dL (ref 3.5–5.0)
Alkaline Phosphatase: 56 U/L (ref 38–126)
Bilirubin, Direct: 0.3 mg/dL — ABNORMAL HIGH (ref 0.0–0.2)
Indirect Bilirubin: 1.7 mg/dL — ABNORMAL HIGH (ref 0.3–0.9)
Total Bilirubin: 2 mg/dL — ABNORMAL HIGH (ref 0.3–1.2)
Total Protein: 7.9 g/dL (ref 6.5–8.1)

## 2019-09-17 LAB — LIPASE, BLOOD: Lipase: 28 U/L (ref 11–51)

## 2019-09-17 LAB — TROPONIN I (HIGH SENSITIVITY): Troponin I (High Sensitivity): 11 ng/L (ref <18)

## 2019-09-17 MED ORDER — ONDANSETRON HCL 4 MG PO TABS
4.0000 mg | ORAL_TABLET | Freq: Three times a day (TID) | ORAL | 0 refills | Status: DC | PRN
Start: 2019-09-17 — End: 2019-09-18

## 2019-09-17 MED ORDER — ONDANSETRON HCL 4 MG/2ML IJ SOLN
4.0000 mg | Freq: Once | INTRAMUSCULAR | Status: AC
  Administered 2019-09-17: 4 mg via INTRAVENOUS
  Filled 2019-09-17: qty 2

## 2019-09-17 MED ORDER — SODIUM CHLORIDE 0.9 % IV BOLUS
1000.0000 mL | Freq: Once | INTRAVENOUS | Status: AC
  Administered 2019-09-17: 1000 mL via INTRAVENOUS

## 2019-09-17 MED ORDER — IOHEXOL 300 MG/ML  SOLN
80.0000 mL | Freq: Once | INTRAMUSCULAR | Status: AC | PRN
  Administered 2019-09-17: 80 mL via INTRAVENOUS

## 2019-09-17 NOTE — Progress Notes (Signed)
Pt resting quietly in his room. Pt is alert and oriented to person, place, time and situation. Pt is calm cooperative, guarded, hypoverbal. Pt makes no gestures to harm self or others. Will continue to monitor pt per Q15 minute face checks and monitor for safety and progress. Report given to oncoming shift RN.

## 2019-09-17 NOTE — Plan of Care (Signed)
[facility] Observation Crisis Plan  Reason for Crisis Plan:  Crisis Stabilization   Plan of Care:  Referral for Inpatient Hospitalization  Family Support:      Current Living Environment:  Living Arrangements: Other (Comment)(Homeless)  Insurance:   Hospital Account    Name Acct ID Class Status Primary Coverage   Jake, Neal [id number] BEHAVIORAL HEALTH OBSERVATION [facility] FOR MH/DD/SAS - [facility]        Guarantor Account (for Hospital Account [id number])    Name Relation to Pt Service Area Active? Acct Type   Jake Neal A Self [facility] Yes Behavioral Health   Address Phone       [address] [place], [place] [address] [phone](H)          Coverage Information (for Hospital Account [id number])    F/O Payor/Plan Precert #   [facility] FOR MH/DD/SAS/3-WAY [facility]    Subscriber Subscriber #   Jake, Neal [id number]   Address Phone   [address] [place], [place] [address] [phone]      Legal Guardian:  Legal Guardian: (NA)  Primary Care Provider:  System, Pcp Not In  Current Outpatient Providers:  none  Psychiatrist:  Name of Psychiatrist: None  Counselor/Therapist:  Name of Therapist: None  Compliant with Medications:  No  Additional Information:   Jake Neal 4/24/202110:39 PM

## 2019-09-17 NOTE — Progress Notes (Signed)
Patient ID: Jake Neal, male   DOB: [birth year], 29 y.o.   MRN: [id number] Pt A&O x 4, presents with passive SI, no plan noted.  Pt very guarded, not forthcoming with information.  Denies HI, AVH noted.  Pt reports she hears & sees things.  Skin search completed, monitoring for safety.  No distress noted, calm & cooperative at present.

## 2019-09-17 NOTE — BH Assessment (Signed)
[facility] Assessment Progress Note  Case was staffed with Lord DNP who recommended patient be observed and monitored.         

## 2019-09-17 NOTE — BH Assessment (Signed)
Assessment Note  Jake Neal is an 29 y.o. male that presents this date voluntary at [facility] as a walk in. Patient reports ongoing S/I for the last several days due to being homeless. Patient denies any plan or intent. Patient is very vague in reference to S/I and renders limited history. Patient is difficult to redirect and will not answer some of the questions associated with the assessment. This Probation officer attempts to re frame questions unsuccessfully. Patient is noted to be laying on the bed with his head covered arm by his arm. Patient speaks in a low soft voice that is difficult to understand at times. Patient denies any H/I although reports ongoing AVH. Patient is vague in reference to [place] stating "he hears and sees things." Patient has a noted history of Schizophrenia and has in the past per chart review, been seen by [facility] MD who assisted with medication management. Patient reports he has not been on medications for symptom management since "maybe a year ago." Patient renders a limited treatment history and denies any previous attempts or gestures at self harm. Patient also denies any history of SA use although per chart review has used cannabis in the past. Patient was last seen in 2020 when he presented with his Aunt reporting ongoing paranoia and AVH. Patient was with IVC at that time. Patient this date cannot identify any symptoms associated with the AVH stating he is "just depressed." Patient cannot recall any of his mental health history or medications he has been prescribed in the past. Patient states that he use to work but now has been unemployed "for a long time." Patient was seen earlier this date at [facility] stating he "hasn't been eating" and presented with nausea. Patient was discharged. Patient is oriented x 4 and speaks in a low soft voice. Patient appears to be disorganized at times and renders limited history. Patient does not appear to be responding to internal stimuli. Case was staffed with  Reita Cliche DNP who recommended patient be observed and monitored.       Diagnosis: F29.0 Schizophrenia  Past Medical History:  Past Medical History:  Diagnosis Date  . Medical history non-contributory     No past surgical history on file.  Family History: No family history on file.  Social History:  reports that he has quit smoking. He has never used smokeless tobacco. He reports current drug use. Drug: Marijuana. He reports that he does not drink alcohol.  Additional Social History:  Alcohol / Drug Use Pain Medications: See MAR Prescriptions: See MAR Over the Counter: See MAR History of alcohol / drug use?: No history of alcohol / drug abuse(Pt denies any hx)  CIWA:   COWS:    Allergies: No Known Allergies  Home Medications: (Not in a hospital admission)   OB/GYN Status:  No LMP for male patient.  General Assessment Data Location of Assessment: [facility] Assessment Services [name] Assessment: In system Is this a Tele or Face-to-Face Assessment?: Face-to-Face Is this an Initial Assessment or a Re-assessment for this encounter?: Initial Assessment Patient Accompanied by:: N/A Language Other than English: No Living Arrangements: Other (Comment)(Homeless) What gender do you identify as?: Male Marital status: Single Living Arrangements: Other (Comment)(Homeless) Can pt return to current living arrangement?: Yes Admission Status: Voluntary Is patient capable of signing voluntary admission?: Yes Referral Source: Self/Family/Friend Insurance type: [place] Living Arrangements: Other (Comment)(Homeless) Legal Guardian: (NA) Name of Psychiatrist: None Name of Therapist: None  Education Status  Is patient currently in school?: No Is the patient employed, unemployed or receiving disability?: Unemployed  Risk to self with the past 6 months Suicidal Ideation: Yes-Currently Present Has patient been a risk to self within the past 6 months prior to  admission? : No Suicidal Intent: No Has patient had any suicidal intent within the past 6 months prior to admission? : No Is patient at risk for suicide?: Yes Suicidal Plan?: No Has patient had any suicidal plan within the past 6 months prior to admission? : No Access to Means: No What has been your use of drugs/alcohol within the last 12 months?: Currently denies Previous Attempts/Gestures: No How many times?: 0 Other Self Harm Risks: (Off medications) Triggers for Past Attempts: (NA) Intentional Self Injurious Behavior: None Family Suicide History: No Recent stressful life event(s): Other (Comment)(Homeless and off medications) Persecutory voices/beliefs?: No Depression: No Depression Symptoms: (Pt denies) Substance abuse history and/or treatment for substance abuse?: No Suicide prevention information given to non-admitted patients: Not applicable  Risk to Others within the past 6 months Homicidal Ideation: No Does patient have any lifetime risk of violence toward others beyond the six months prior to admission? : No Thoughts of Harm to Others: No Current Homicidal Intent: No Current Homicidal Plan: No Access to Homicidal Means: No Identified Victim: NA History of harm to others?: No Assessment of Violence: None Noted Violent Behavior Description: NA Does patient have access to weapons?: No Criminal Charges Pending?: No Does patient have a court date: No Is patient on probation?: No  Psychosis Hallucinations: Auditory, Visual Delusions: None noted  Mental Status Report Appearance/Hygiene: Unremarkable Eye Contact: Fair Motor Activity: Freedom of movement Speech: Logical/coherent Level of Consciousness: Quiet/awake Mood: Pleasant Affect: Appropriate to circumstance Anxiety Level: Minimal Thought Processes: Coherent, Relevant Judgement: Partial Orientation: Person, Place, Time Obsessive Compulsive Thoughts/Behaviors: None  Cognitive Functioning Concentration:  Normal Memory: Recent Intact, Remote Intact Is patient IDD: No Insight: Fair Impulse Control: Poor Appetite: Good Have you had any weight changes? : No Change Sleep: No Change Total Hours of Sleep: 7 Vegetative Symptoms: None  ADLScreening [facility]) Patient's cognitive ability adequate to safely complete daily activities?: Yes Patient able to express need for assistance with ADLs?: Yes Independently performs ADLs?: Yes (appropriate for developmental age)  Prior Inpatient Therapy Prior Inpatient Therapy: Yes Prior Therapy Dates: 2020, 2018 Prior Therapy Facilty/Provider(s): [facility] Reason for Treatment: MH issues  Prior Outpatient Therapy Prior Outpatient Therapy: Yes Prior Therapy Dates: 2020 Prior Therapy Facilty/Provider(s): [facility] Reason for Treatment: Med mang Does patient have an ACCT team?: No Does patient have Intensive In-House Services?  : No Does patient have [facility] services? : No Does patient have P4CC services?: No  ADL Screening (condition at time of admission) Patient's cognitive ability adequate to safely complete daily activities?: Yes Is the patient deaf or have difficulty hearing?: No Does the patient have difficulty seeing, even when wearing glasses/contacts?: No Does the patient have difficulty concentrating, remembering, or making decisions?: No Patient able to express need for assistance with ADLs?: Yes Does the patient have difficulty dressing or bathing?: No Independently performs ADLs?: Yes (appropriate for developmental age) Does the patient have difficulty walking or climbing stairs?: No Weakness of Legs: None Weakness of Arms/Hands: None  Home Assistive Devices/Equipment Home Assistive Devices/Equipment: None  Therapy Consults (therapy consults require a physician order) PT Evaluation Needed: No OT Evalulation Needed: No SLP Evaluation Needed: No Abuse/Neglect Assessment (Assessment to be complete while patient is  alone) Abuse/Neglect Assessment Can Be Completed:  Yes Physical Abuse: Denies Verbal Abuse: Denies Sexual Abuse: Denies Exploitation of patient/patient's resources: Denies Self-Neglect: Denies Values / Beliefs Cultural Requests During Hospitalization: None Spiritual Requests During Hospitalization: None Consults Spiritual Care Consult Needed: No Transition of Care Team Consult Needed: No Advance Directives (For Healthcare) Does Patient Have a Medical Advance Directive?: No Would patient like information on creating a medical advance directive?: No - Patient declined          Disposition: Case was staffed with Shaune Pollack DNP who recommended patient be observed and monitored.   Disposition Initial Assessment Completed for this Encounter: Yes Disposition of Patient: (Observation )  On Site Evaluation by:   Reviewed with Physician:    Alfredia Ferguson 09/17/2019 6:19 PM

## 2019-09-17 NOTE — Discharge Instructions (Signed)
It is important that you are eating/drinking regularly.  Follow up with your primary care doctor for recheck of your kidneys and your symptoms.  Return to the ER if you develop any new, worsening, or concerning symptoms.

## 2019-09-17 NOTE — ED Provider Notes (Signed)
[facility] EMERGENCY DEPARTMENT Provider Note   CSN: [id number] Arrival date & time: 09/16/19  2009     History Chief Complaint  Patient presents with  . Chest Pain    Jake Neal is a 29 y.o. male presenting for evaluation of n/v, abd pain, and intermittent cp.   Pt states sxs began 1 wk ago. He has not been eating/drinking much (if at all). He cannot say why (?deu to money problems). Yesterday he had 1 episode of vomiting, has continued to have nausea. He states he has not had a BM in 1 wk. He reports intermittent cp that goes across his chest for the past week. He reports feeling lightheaded, but no syncope. He denies fevers, chills, cough, urinary sxs. He denies sick contacts. He is sup[posed to be on Abilify injections, but has not had any since December 2020.   Additional history obtained from chart review. H/o polysubstance abuse, bipolar, and schizophrenia.   HPI     Past Medical History:  Diagnosis Date  . Medical history non-contributory     Patient Active Problem List   Diagnosis Date Noted  . Schizophrenia, paranoid type (HCC) 02/04/2019  . Chronic post-traumatic stress disorder (PTSD) 01/28/2019  . Psychosis (HCC) 08/02/2018  . Major depressive disorder, recurrent episode, moderate (HCC)   . 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     No family history on file.  Social History   Tobacco Use  . Smoking status: Former Games developer  . Smokeless tobacco: Never Used  Substance Use Topics  . Alcohol use: No  . Drug use: Yes    Types: Marijuana    Home Medications Prior to Admission medications   Medication Sig Start Date End Date Taking? Authorizing Provider  hydrOXYzine (ATARAX/VISTARIL) 50 MG tablet Take 1 tablet (50 mg total) by mouth 3 (three) times daily as needed (agitation/sleep). Patient not taking: Reported on 09/17/2019 05/30/19   Pucilowski, Roosvelt Maser, MD    mirtazapine (REMERON) 7.5 MG tablet Take 1 tablet (7.5 mg total) by mouth at bedtime. 05/30/19 08/28/19  Pucilowski, Roosvelt Maser, MD  ondansetron (ZOFRAN) 4 MG tablet Take 1 tablet (4 mg total) by mouth every 8 (eight) hours as needed for nausea or vomiting. 09/17/19   Areeba Sulser, PA-C  sertraline (ZOLOFT) 100 MG tablet Take 1 tablet (100 mg total) by mouth daily. 05/30/19 08/28/19  Pucilowski, Roosvelt Maser, MD    Allergies    Patient has no known allergies.  Review of Systems   Review of Systems  Cardiovascular: Positive for chest pain.  Gastrointestinal: Positive for abdominal pain, nausea and vomiting.  All other systems reviewed and are negative.   Physical Exam Updated Vital Signs BP 110/79   Pulse 82   Temp 97.9 F (36.6 C) (Oral)   Resp 19   Ht 6\' 2"  (1.88 m)   Wt 63.5 kg   SpO2 99%   BMI 17.97 kg/m   Physical Exam Vitals and nursing note reviewed.  Constitutional:      General: He is not in acute distress.    Appearance: He is well-developed.     Comments: Appears nontoxic  HENT:     Head: Normocephalic and atraumatic.  Eyes:     Extraocular Movements: Extraocular movements intact.     Conjunctiva/sclera: Conjunctivae normal.     Pupils: Pupils are equal, round, and reactive to light.  Cardiovascular:     Rate and Rhythm: Normal  rate and regular rhythm.     Pulses: Normal pulses.  Pulmonary:     Effort: Pulmonary effort is normal. No respiratory distress.     Breath sounds: Normal breath sounds. No wheezing.     Comments: Clear lung sounds in all fields. No ttp of the chest wall Abdominal:     General: There is no distension.     Palpations: Abdomen is soft. There is no mass.     Tenderness: There is abdominal tenderness. There is no guarding or rebound.     Comments: ttp of periumbilical and suprapubic abd. No rigidity, guarding, distention. Negative rebound.   Musculoskeletal:        General: Normal range of motion.     Cervical back: Normal range of motion  and neck supple.     Right lower leg: No edema.     Left lower leg: No edema.  Skin:    General: Skin is warm and dry.     Capillary Refill: Capillary refill takes less than 2 seconds.  Neurological:     Mental Status: He is alert and oriented to person, place, and time.     ED Results / Procedures / Treatments   Labs (all labs ordered are listed, but only abnormal results are displayed) Labs Reviewed  BASIC METABOLIC PANEL - Abnormal; Notable for the following components:      Result Value   CO2 16 (*)    Glucose, Bld 111 (*)    Creatinine, Ser 1.77 (*)    GFR calc non Af Amer 51 (*)    GFR calc Af Amer 60 (*)    Anion gap 23 (*)    All other components within normal limits  HEPATIC FUNCTION PANEL - Abnormal; Notable for the following components:   Total Bilirubin 2.0 (*)    Bilirubin, Direct 0.3 (*)    Indirect Bilirubin 1.7 (*)    All other components within normal limits  CBC  LIPASE, BLOOD  TROPONIN I (HIGH SENSITIVITY)  TROPONIN I (HIGH SENSITIVITY)    EKG EKG Interpretation  Date/Time:  Friday September 16 2019 20:44:19 EDT Ventricular Rate:  107 PR Interval:  120 QRS Duration: 78 QT Interval:  328 QTC Calculation: 437 R Axis:   82 Text Interpretation: Sinus tachycardia Otherwise normal ECG No significant change since last tracing Confirmed by Frederick Peers [phone]) on 09/17/2019 9:32:44 AM   Radiology DG Chest 2 View  Result Date: 09/16/2019 CLINICAL DATA:  Chest pain. EXAM: CHEST - 2 VIEW COMPARISON:  July 04, 2016 FINDINGS: The heart size and mediastinal contours are within normal limits. Both lungs are clear. The visualized skeletal structures are unremarkable. IMPRESSION: No active cardiopulmonary disease. Electronically Signed   By: Gerome Sam III M.D   On: 09/16/2019 21:09   CT ABDOMEN PELVIS W CONTRAST  Result Date: 09/17/2019 CLINICAL DATA:  Nausea and vomiting. Lower abdominal pain. No bowel movement within the past week. Acidosis. EXAM: CT  ABDOMEN AND PELVIS WITH CONTRAST TECHNIQUE: Multidetector CT imaging of the abdomen and pelvis was performed using the standard protocol following bolus administration of intravenous contrast. CONTRAST:  36mL OMNIPAQUE IOHEXOL 300 MG/ML  SOLN COMPARISON:  None. FINDINGS: Examination is degraded due to patient motion artifact. Lower chest: Limited visualization of the lower thorax demonstrates minimal subsegmental atelectasis within the right middle lobe. No focal airspace opacities. No pleural effusion. Normal heart size.  No pericardial effusion. Hepatobiliary: Normal hepatic contour. There is a minimal amount of focal fatty infiltration adjacent to  the fissure for ligamentum teres. No discrete hepatic lesions. Normal appearance of the gallbladder given degree distention. No radiopaque gallstones. No intra extrahepatic bili duct dilatation. No ascites. Pancreas: Normal appearance of the pancreas. Spleen: The spleen is somewhat diminutive but otherwise normal in appearance. Adrenals/Urinary Tract: There is symmetric enhancement of the bilateral kidneys. No definite renal stones this postcontrast examination. No discrete renal lesions. No urine obstruction or perinephric stranding. Normal appearance of the bilateral adrenal glands. Normal appearance of the urinary bladder given degree distention. Stomach/Bowel: Moderate colonic stool burden without evidence of enteric obstruction. Normal appearance of the terminal ileum and appendix, a portion of which appears air-filled (image 57, series 3). No discrete areas of bowel wall thickening. No hiatal hernia. No pneumoperitoneum, pneumatosis or portal venous gas. Vascular/Lymphatic: Normal caliber of the abdominal aorta. The major branch vessels of the abdominal aorta appear patent on this non CTA examination. No bulky retroperitoneal, mesenteric, pelvic or inguinal lymphadenopathy. Reproductive: Normal appearance of the prostate gland. Punctate phleboliths are seen with  the left hemipelvis. No free fluid the pelvic cul-de-sac. Other: There is a very minimal amount of subcutaneous edema about the midline of the low back. Musculoskeletal: No acute or aggressive osseous abnormalities. Note is made of a right-sided L5-S1 assimilation joint. IMPRESSION: No acute findings within the abdomen or pelvis. Specifically, no evidence of enteric or urinary obstruction. Normal appearance of the appendix. Electronically Signed   By: Sandi Mariscal M.D.   On: 09/17/2019 10:22    Procedures Procedures (including critical care time)  Medications Ordered in ED Medications  sodium chloride 0.9 % bolus 1,000 mL (0 mLs Intravenous Stopped 09/17/19 1043)  ondansetron (ZOFRAN) injection 4 mg (4 mg Intravenous Given 09/17/19 0841)  iohexol (OMNIPAQUE) 300 MG/ML solution 80 mL (80 mLs Intravenous Contrast Given 09/17/19 0948)    ED Course  I have reviewed the triage vital signs and the nursing notes.  Pertinent labs & imaging results that were available during my care of the patient were reviewed by me and considered in my medical decision making (see chart for details).    MDM Rules/Calculators/A&P                      Pt presenting for evaluation of n/v, abd pain and cp. On exam, pt appears nontoxic. He does have nonspecific abd pain. Labs obtained from triage interpreted by me. C/w with acidosis (? Due to decreased PO intake). Mildly elevated SCr. Trop negative x2. cxr viewed and interpreted by me, no pna, pnx, effusion. EKG without stemi. Due to complaints of abd pain, n/v, will add of lipase and lfts. Will give fluids and zofran. CT ordered due to 1 wk of no BMs and abd pain.   Lipase negative. lft's show mild elevation of bili at 2, but consider due to dehydration. CT without acute findings. Pt reports sxs are improved, he is tolerating PO. Will have pt take zofran as needed at home, and f/u with PCP. At this time, pt appears safe for d/c. Return precautions given. Pt states he  understands and agrees to plan.   Final Clinical Impression(s) / ED Diagnoses Final diagnoses:  Non-intractable vomiting with nausea, unspecified vomiting type  Lower abdominal pain  Constipation, unspecified constipation type  Dehydration    Rx / DC Orders ED Discharge Orders         Ordered    ondansetron (ZOFRAN) 4 MG tablet  Every 8 hours PRN     09/17/19 1046  Alveria Apley, PA-C 09/17/19 1055    Little, Ambrose Finland, MD 09/18/19 [phone]

## 2019-09-18 DIAGNOSIS — F200 Paranoid schizophrenia: Secondary | ICD-10-CM | POA: Diagnosis not present

## 2019-09-18 MED ORDER — OLANZAPINE 5 MG PO TABS: 5.0000 mg | ORAL_TABLET | Freq: Every day | ORAL | 0 refills | Status: DC

## 2019-09-18 MED ORDER — OLANZAPINE 5 MG PO TABS: 5.0000 mg | ORAL_TABLET | Freq: Every day | ORAL | Status: DC

## 2019-09-18 NOTE — Progress Notes (Signed)
Nursing Discharge Note:  D:Patient denies SI/HI at this time. Pt appears calm and cooperative, and no distress noted.  A: All Personal items returned to patient. Pt.  AVS reviewed with him and written copy given to patient and he verbalized understanding.Patient aware to pick up prescription at his pharmacy. Pt escorted to the lobby to catch bus and bus pass was given to patient.  R:  Pt States he will comply with taking his medications as  prescribed.

## 2019-09-18 NOTE — Discharge Summary (Addendum)
Physician Discharge Summary Note  Patient:  Jake Neal is an 29 y.o., male MRN:  [id number] DOB:  1990-09-19 Patient phone:  [phone] (home)  Patient address:   [address] [place] [address],  Total Time spent with patient: 30 minutes  Date of Admission:  09/17/2019 Date of Discharge: 09/18/2019  Reason for Admission: Patient initially admitted verbalizing suicidal ideations for several days related to being homeless. Patient denies suicidal and homicidal ideations today.  Patient displays disorganized thoughts, alogia, and flat affect. Patient seen along with Dr. Jannifer Franklin.  Patient verbalizes plan to return to father's home and follow-up with outpatient psychiatry at [facility] tomorrow.  Principal Problem: Schizophrenia, paranoid type [facility]) Discharge Diagnoses: Principal Problem:   Schizophrenia, paranoid type (HCC)   Past Psychiatric History: Schizophrenia, paranoid type, substance-induced psychotic disorder, major depressive disorder, PTSD  Past Medical History:  Past Medical History:  Diagnosis Date   Medical history non-contributory    History reviewed. No pertinent surgical history. Family History: History reviewed. No pertinent family history. Family Psychiatric  History: Unknown Social History:  Social History   Substance and Sexual Activity  Alcohol Use No     Social History   Substance and Sexual Activity  Drug Use Yes   Types: Marijuana    Social History   Socioeconomic History   Marital status: Single    Spouse name: Not on file   Number of children: Not on file   Years of education: Not on file   Highest education level: Not on file  Occupational History   Not on file  Tobacco Use   Smoking status: Former Smoker   Smokeless tobacco: Never Used  Substance and Sexual Activity   Alcohol use: No   Drug use: Yes    Types: Marijuana   Sexual activity: Yes    Birth control/protection: None  Other Topics Concern   Not on file  Social  History Narrative   Not on file   Social Determinants of Health   Financial Resource Strain:    Difficulty of Paying Living Expenses:   Food Insecurity:    Worried About Programme researcher, broadcasting/film/video in the Last Year:    Barista in the Last Year:   Transportation Needs:    Freight forwarder (Medical):    Lack of Transportation (Non-Medical):   Physical Activity:    Days of Exercise per Week:    Minutes of Exercise per Session:   Stress:    Feeling of Stress :   Social Connections:    Frequency of Communication with Friends and Family:    Frequency of Social Gatherings with Friends and Family:    Attends Religious Services:    Active Member of Clubs or Organizations:    Attends Banker Meetings:    Marital Status:     Hospital Course: Patient admitted complaint of suicidal ideations, denies plan or intent.  Patient presents with some disorganized conversation and flat affect.  Zyprexa 5 mg initiated.  Patient discharged with plan to follow-up with outpatient psychiatry.  Physical Findings: AIMS: Facial and Oral Movements Muscles of Facial Expression: None, normal Lips and Perioral Area: None, normal Jaw: None, normal Tongue: None, normal,Extremity Movements Upper (arms, wrists, hands, fingers): None, normal Lower (legs, knees, ankles, toes): None, normal, Trunk Movements Neck, shoulders, hips: None, normal, Overall Severity Severity of abnormal movements (highest score from questions above): None, normal Incapacitation due to abnormal movements: None, normal Patient's awareness of abnormal movements (rate only patient's  report): No Awareness, Dental Status Current problems with teeth and/or dentures?: No Does patient usually wear dentures?: No  CIWA:  CIWA-Ar Total: 2 COWS:  COWS Total Score: 3  Musculoskeletal: Strength & Muscle Tone: within normal limits Gait & Station: normal Patient leans: N/A  Psychiatric Specialty Exam: Physical Exam  Nursing  note and vitals reviewed. Constitutional: He is oriented to person, place, and time. He appears well-developed.  HENT:  Head: Normocephalic.  Cardiovascular: Normal rate.  Respiratory: Effort normal.  Neurological: He is alert and oriented to person, place, and time.    Review of Systems  Blood pressure 111/67, pulse 88, temperature 98.3 F (36.8 C), temperature source Oral, resp. rate 18, SpO2 100 %.There is no height or weight on file to calculate BMI.  General Appearance: Disheveled  Eye Contact:  Good  Speech:  Clear and Coherent and Normal Rate  Volume:  Normal  Mood:  Anxious  Affect:  Congruent  Thought Process:  Disorganized and Descriptions of Associations: Loose  Orientation:  Full (Time, Place, and Person)  Thought Content:  Rumination  Suicidal Thoughts:  No  Homicidal Thoughts:  No  Memory:  Immediate;   Good Recent;   Good Remote;   Good  Judgement:  Fair  Insight:  Fair  Psychomotor Activity:  Normal  Concentration:  Concentration: Good and Attention Span: Good  Recall:  Good  Fund of Knowledge:  Good  Language:  Good  Akathisia:  No  Handed:  Right  AIMS (if indicated):     Assets:  Communication Skills Desire for Improvement Financial Resources/Insurance Housing Intimacy Leisure Time Physical Health Resilience Social Support Talents/Skills Transportation  ADL's:  Intact  Cognition:  WNL  Sleep:           Has this patient used any form of tobacco in the last 30 days? (Cigarettes, Smokeless Tobacco, Cigars, and/or Pipes) Yes, Yes, A prescription for an FDA-approved tobacco cessation medication was offered at discharge and the patient refused  Blood Alcohol level:  Lab Results  Component Value Date   [facility] <10 07/31/2018   ETH <5 07/04/2016    Metabolic Disorder Labs:  Lab Results  Component Value Date   HGBA1C 4.9 08/03/2018   MPG 93.93 08/03/2018   MPG 100 07/04/2016   Lab Results  Component Value Date   PROLACTIN 9.9 07/06/2016    PROLACTIN 14.0 07/04/2016   Lab Results  Component Value Date   CHOL 193 08/03/2018   TRIG 64 08/03/2018   HDL 42 08/03/2018   CHOLHDL 4.6 08/03/2018   VLDL 13 08/03/2018   LDLCALC 138 (H) 08/03/2018   LDLCALC 97 07/06/2016    See Psychiatric Specialty Exam and Suicide Risk Assessment completed by Attending Physician prior to discharge.  Discharge destination:  Home  Is patient on multiple antipsychotic therapies at discharge:  No   Has Patient had three or more failed trials of antipsychotic monotherapy by history:  No  Recommended Plan for Multiple Antipsychotic Therapies: NA    Allergies as of 09/18/2019   No Known Allergies      Medication List     STOP taking these medications    hydrOXYzine 50 MG tablet Commonly known as: ATARAX/VISTARIL   mirtazapine 7.5 MG tablet Commonly known as: REMERON   ondansetron 4 MG tablet Commonly known as: ZOFRAN   sertraline 100 MG tablet Commonly known as: ZOLOFT       TAKE these medications      Indication  OLANZapine 5 MG tablet Commonly known  as: ZYPREXA Take 1 tablet (5 mg total) by mouth at bedtime.  Indication: Schizophrenia          Follow-up recommendations:  Other:  Follow-up with outpatient psychiatry, [facility] suggested.  Comments: Discharge  Signed: Emmaline Kluver, [place] 09/18/2019, 11:42 AM Patient seen face-to-face for psychiatric evaluation, chart reviewed and case discussed with the physician extender and developed treatment plan. Reviewed the information documented and agree with the treatment plan. Corena Pilgrim, MD

## 2019-09-18 NOTE — Progress Notes (Signed)
Patient asked for dad phone number, he states he doesn't remember it off the top of his head. Inetta Fermo NP aware.

## 2019-09-18 NOTE — Progress Notes (Signed)
D: Pt denies SI/HI. Pt is pleasant and cooperative. Patient is a little guarded this morning. A: Pt was offered support and encouragement. Q 15 minute checks were done for safety.  R: Pt has no complaints.Pt receptive to treatment and safety maintained on unit.

## 2019-09-18 NOTE — [facility] Suicide Risk Assessment (Cosign Needed)
Suicide Risk Assessment  Discharge Assessment   [facility] Discharge Suicide Risk Assessment   Principal Problem: Schizophrenia, paranoid type [facility]) Discharge Diagnoses: Principal Problem:   Schizophrenia, paranoid type (HCC)   Total Time spent with patient: 30 minutes  Musculoskeletal: Strength & Muscle Tone: within normal limits Gait & Station: normal Patient leans: N/A  Psychiatric Specialty Exam:   Blood pressure 111/67, pulse 88, temperature 98.3 F (36.8 C), temperature source Oral, resp. rate 18, SpO2 100 %.There is no height or weight on file to calculate BMI.  General Appearance: Casual and Fairly Groomed  Eye Contact::  Good  Speech:  Clear and Coherent and Normal Rate409  Volume:  Normal  Mood:  Anxious  Affect:  Congruent  Thought Process:  Disorganized and Descriptions of Associations: Loose  Orientation:  Full (Time, Place, and Person)  Thought Content:  Rumination  Suicidal Thoughts:  No  Homicidal Thoughts:  No  Memory:  Immediate;   Good Recent;   Good Remote;   Good  Judgement:  Fair  Insight:  Fair  Psychomotor Activity:  Normal  Concentration:  Good  Recall:  Good  Fund of Knowledge:Good  Language: Good  Akathisia:  No  Handed:  Right  AIMS (if indicated):     Assets:  Communication Skills Desire for Improvement Financial Resources/Insurance Housing Intimacy Leisure Time Physical Health Resilience  Sleep:     Cognition: WNL  ADL's:  Intact   Mental Status Per Nursing Assessment::   On Admission:  Self-harm thoughts  Demographic Factors:  Male  Loss Factors: NA  Historical Factors: NA  Risk Reduction Factors:   Living with another person, especially a relative, Positive social support, Positive therapeutic relationship and Positive coping skills or problem solving skills  Continued Clinical Symptoms:    Cognitive Features That Contribute To Risk:  None    Suicide Risk:  Minimal: No identifiable suicidal ideation.  Patients  presenting with no risk factors but with morbid ruminations; may be classified as minimal risk based on the severity of the depressive symptoms    Plan Of Care/Follow-up recommendations:  Other:  Discharge, follow up with outpatient psychiatry, [facility] recommended  Jake Dolly, FNP 09/18/2019, 11:37 AM

## 2019-09-19 ENCOUNTER — Encounter (HOSPITAL_COMMUNITY): Payer: Self-pay

## 2019-09-19 ENCOUNTER — Emergency Department (HOSPITAL_COMMUNITY)
Admission: EM | Admit: 2019-09-19 | Discharge: 2019-09-20 | Disposition: A | Discharge status: Home / Self Care | Payer: Medicaid Other | Attending: Emergency Medicine | Admitting: Emergency Medicine

## 2019-09-19 ENCOUNTER — Ambulatory Visit (HOSPITAL_COMMUNITY)
Admission: AD | Admit: 2019-09-19 | Discharge: 2019-09-19 | Disposition: A | Discharge status: Home / Self Care | Payer: Medicaid Other | Attending: Psychiatry | Admitting: Psychiatry

## 2019-09-19 ENCOUNTER — Other Ambulatory Visit: Payer: Self-pay

## 2019-09-19 DIAGNOSIS — F419 Anxiety disorder, unspecified: Secondary | ICD-10-CM | POA: Diagnosis not present

## 2019-09-19 DIAGNOSIS — R440 Auditory hallucinations: Secondary | ICD-10-CM | POA: Insufficient documentation

## 2019-09-19 DIAGNOSIS — R45851 Suicidal ideations: Secondary | ICD-10-CM | POA: Insufficient documentation

## 2019-09-19 DIAGNOSIS — F22 Delusional disorders: Secondary | ICD-10-CM | POA: Diagnosis present

## 2019-09-19 DIAGNOSIS — F329 Major depressive disorder, single episode, unspecified: Secondary | ICD-10-CM | POA: Diagnosis not present

## 2019-09-19 DIAGNOSIS — Z5321 Procedure and treatment not carried out due to patient leaving prior to being seen by health care provider: Secondary | ICD-10-CM | POA: Diagnosis not present

## 2019-09-19 NOTE — BH Assessment (Signed)
Assessment Note  Jake Neal is an 29 y.o. male. Pt presents to [facility] as a walk in unaccompanied for paranoia and passive SI thoughts. Pt was asked what changes took place since discharge on 4/25. Pt states, " I feel like people are watching me". Pt states that he also is hearing and seeing things but could not identify what it is he is hearing or seeing. Pt endorses current passive SI thoughts of wanting to choke himself denies any plan and only 1 past SI attempt. Patient is very vague in reference to S/I and renders limited history. Pt denies HI, SIB and use of any substances but states he did something very bad that his father did not like which is why he is currently homeless. Pt states he was living with  His father last few days as well as his aunts house but does not have place of his own. Pt disclosed that he has been off his medications since September 2020 and he currently does not have a provider. Pt last seen at [facility] on 4/25 for similar presentation and was discharged and instructed to follow up with [facility], but did not do so. Pt states he wants help getting back on medications although he states he does not like the way the medications make him feel. Pt endorses symptoms of depression such as hopelessness, worthlessness, isolation, irritability. Pt reports getting 0 hours of sleep and poor apptite as well. Pt presented oriented x4, calm, cooperative with flat affect but pleasant mood. Pt thought process logical and coherent, normal concentration. Pt did not present to be responding to internal stimuli or delusional content but did present with some paranoia.  Diagnosis: Schizophrenia, paranoid type   Past Medical History:  Past Medical History:  Diagnosis Date  . Medical history non-contributory     No past surgical history on file.  Family History: No family history on file.  Social History:  reports that he has quit smoking. He has never used smokeless tobacco. He reports current drug  use. Drug: Marijuana. He reports that he does not drink alcohol.  Additional Social History:  Alcohol / Drug Use Pain Medications: see MAR Prescriptions: see MAR Over the Counter: see MAR  CIWA: CIWA-Ar BP: 134/62 Pulse Rate: 79 COWS:    Allergies: No Known Allergies  Home Medications: (Not in a hospital admission)   OB/GYN Status:  No LMP for male patient.  General Assessment Data Location of Assessment: [facility] Assessment Services [name] Assessment: In system Is this a Tele or Face-to-Face Assessment?: Face-to-Face Is this an Initial Assessment or a Re-assessment for this encounter?: Initial Assessment Patient Accompanied by:: N/A Language Other than English: No Living Arrangements: Other (Comment) What gender do you identify as?: Male Marital status: Single Living Arrangements: Other (Comment) Can pt return to current living arrangement?: Yes Admission Status: Voluntary Is patient capable of signing voluntary admission?: Yes Referral Source: Self/Family/Friend Insurance type: [facility]     Crisis Care Plan Living Arrangements: Other (Comment) Legal Guardian: Other:(self) Name of Psychiatrist: None Name of Therapist: None  Education Status Is patient currently in school?: No Is the patient employed, unemployed or receiving disability?: Unemployed  Risk to self with the past 6 months Suicidal Ideation: Yes-Currently Present Has patient been a risk to self within the past 6 months prior to admission? : No Suicidal Intent: No Has patient had any suicidal intent within the past 6 months prior to admission? : No Is patient at risk for suicide?: Yes Suicidal Plan?: No Has  patient had any suicidal plan within the past 6 months prior to admission? : No Access to Means: No What has been your use of drugs/alcohol within the last 12 months?: currently denies Previous Attempts/Gestures: No How many times?: 0 Other Self Harm Risks: (off medications) Triggers for Past  Attempts: Unknown Intentional Self Injurious Behavior: None Family Suicide History: No Recent stressful life event(s): Other (Comment)(homeless) Persecutory voices/beliefs?: No Depression: Yes Depression Symptoms: Insomnia, Isolating, Feeling worthless/self pity, Feeling angry/irritable Substance abuse history and/or treatment for substance abuse?: No Suicide prevention information given to non-admitted patients: Not applicable  Risk to Others within the past 6 months Homicidal Ideation: No Does patient have any lifetime risk of violence toward others beyond the six months prior to admission? : No Thoughts of Harm to Others: No Current Homicidal Intent: No Current Homicidal Plan: No Access to Homicidal Means: No Identified Victim: none History of harm to others?: No Assessment of Violence: None Noted Violent Behavior Description: NA Does patient have access to weapons?: No Criminal Charges Pending?: No Does patient have a court date: No Is patient on probation?: No  Psychosis Hallucinations: Auditory, Visual Delusions: None noted  Mental Status Report Appearance/Hygiene: Body odor, Unremarkable Eye Contact: Fair Motor Activity: Freedom of movement Speech: Logical/coherent Level of Consciousness: Alert Mood: Pleasant Affect: Appropriate to circumstance Anxiety Level: Minimal Thought Processes: Coherent, Relevant Judgement: Partial Orientation: Person, Place, Time Obsessive Compulsive Thoughts/Behaviors: None  Cognitive Functioning Concentration: Normal Memory: Recent Intact Is patient IDD: No Insight: Fair Impulse Control: Poor Appetite: Poor Have you had any weight changes? : No Change Sleep: Decreased Total Hours of Sleep: 0 Vegetative Symptoms: None  ADLScreening [facility] Assessment Services) Patient's cognitive ability adequate to safely complete daily activities?: Yes Patient able to express need for assistance with ADLs?: Yes Independently performs ADLs?:  Yes (appropriate for developmental age)  Prior Inpatient Therapy Prior Inpatient Therapy: Yes Prior Therapy Dates: 2020, 2018 Prior Therapy Facilty/Provider(s): [facility] Reason for Treatment: MH issues  Prior Outpatient Therapy Prior Outpatient Therapy: Yes Prior Therapy Dates: 2020 Prior Therapy Facilty/Provider(s): [facility] Reason for Treatment: Med mang Does patient have an ACCT team?: No Does patient have Intensive In-House Services?  : No Does patient have [facility] services? : No Does patient have P4CC services?: No  ADL Screening (condition at time of admission) Patient's cognitive ability adequate to safely complete daily activities?: Yes Patient able to express need for assistance with ADLs?: Yes Independently performs ADLs?: Yes (appropriate for developmental age)                        Disposition: Lindon Romp, FNP recommends pt for overnight observation. Pt transferred to [facility], per dayshift [facility] at capacity on OBS unit. Disposition Initial Assessment Completed for this Encounter: Yes  On Site Evaluation by:  Antony Contras, Latanya Presser Reviewed with Physician:  Lindon Romp, [place] 09/19/2019 8:08 PM

## 2019-09-19 NOTE — ED Triage Notes (Signed)
Pt states he has been having anxiety and depression for months. Pt denies SI and HI at this time.

## 2019-09-20 NOTE — H&P (Signed)
Behavioral Health Medical Screening Exam  Jake Neal is an 29 y.o. male who presents to [facility] as a walk in unaccompanied for paranoia and SI.Pt states, " I feel like people are watching me". Pt states that he also is hearing and seeing things but could not identify what it is he is hearing or seeing. He reports suicidal ideations with thoughts of choking himself. Patient paces in the room constantly during assessment. Speech is pressured at times. He rambles at times and is difficult to comprehend.   Total Time spent with patient: 15 minutes  Psychiatric Specialty Exam: Physical Exam  Constitutional: He is oriented to person, place, and time. He appears well-developed and well-nourished. No distress.  HENT:  Head: Normocephalic and atraumatic.  Right Ear: External ear normal.  Left Ear: External ear normal.  Eyes: Pupils are equal, round, and reactive to light. Right eye exhibits no discharge. Left eye exhibits no discharge.  Cardiovascular: Normal rate.  Respiratory: Effort normal. No respiratory distress.  Musculoskeletal:        General: Normal range of motion.  Neurological: He is alert and oriented to person, place, and time.  Skin: He is not diaphoretic.  Psychiatric: His mood appears anxious. He is withdrawn. Thought content is paranoid. He exhibits a depressed mood. He expresses suicidal ideation. He expresses no homicidal ideation. He expresses suicidal plans.    Review of Systems  Constitutional: Negative for activity change, appetite change, chills, diaphoresis, fatigue, fever and unexpected weight change.  Respiratory: Negative for cough and shortness of breath.   Cardiovascular: Negative for chest pain.  Gastrointestinal: Negative for diarrhea, nausea and vomiting.  Psychiatric/Behavioral: Positive for dysphoric mood, hallucinations, sleep disturbance and suicidal ideas. The patient is nervous/anxious.   All other systems reviewed and are negative.   Blood pressure  134/62, pulse 79, temperature 98 F (36.7 C), temperature source Oral, resp. rate 20, SpO2 99 %.There is no height or weight on file to calculate BMI.  General Appearance: Disheveled  Eye Contact:  Fair  Speech:  Pressured  Volume:  Increased  Mood:  Anxious, Depressed, Hopeless and Worthless  Affect:  Congruent  Thought Process:  Disorganized  Orientation:  Full (Time, Place, and Person)  Thought Content:  Illogical and Hallucinations: Auditory  Suicidal Thoughts:  Yes.  with intent/plan  Homicidal Thoughts:  No  Memory:  Immediate;   Fair Recent;   Fair  Judgement:  Intact  Insight:  Lacking  Psychomotor Activity:  Increased  Concentration: Concentration: Poor and Attention Span: Poor  Recall:  [employer] of Knowledge:Fair  Language: Fair  Akathisia:  Negative  Handed:  Right  AIMS (if indicated):     Assets:  Leisure Time Physical Health  Sleep:       Musculoskeletal: Strength & Muscle Tone: within normal limits Gait & Station: normal Patient leans: N/A  Blood pressure 134/62, pulse 79, temperature 98 F (36.7 C), temperature source Oral, resp. rate 20, SpO2 99 %.  Recommendations:  Based on my evaluation the patient does not appear to have an emergency medical condition.     Jackelyn Poling, NP 09/20/2019, 3:00 AM

## 2019-10-21 ENCOUNTER — Inpatient Hospital Stay (HOSPITAL_COMMUNITY)
Admission: AD | Admit: 2019-10-21 | Discharge: 2019-10-25 | DRG: 885 | Disposition: A | Discharge status: Home / Self Care | Payer: Medicaid Other | Attending: Psychiatry | Admitting: Psychiatry

## 2019-10-21 DIAGNOSIS — Z87891 Personal history of nicotine dependence: Secondary | ICD-10-CM

## 2019-10-21 DIAGNOSIS — Z20822 Contact with and (suspected) exposure to covid-19: Secondary | ICD-10-CM | POA: Diagnosis present

## 2019-10-21 DIAGNOSIS — F200 Paranoid schizophrenia: Principal | ICD-10-CM | POA: Diagnosis present

## 2019-10-21 DIAGNOSIS — R45851 Suicidal ideations: Secondary | ICD-10-CM | POA: Diagnosis present

## 2019-10-21 DIAGNOSIS — F209 Schizophrenia, unspecified: Secondary | ICD-10-CM | POA: Diagnosis present

## 2019-10-21 MED ORDER — ALUM & MAG HYDROXIDE-SIMETH 200-200-20 MG/5ML PO SUSP: 30.0000 mL | ORAL | Status: DC | PRN

## 2019-10-21 MED ORDER — TRAZODONE HCL 50 MG PO TABS
50.0000 mg | ORAL_TABLET | Freq: Every evening | ORAL | Status: DC | PRN
  Administered 2019-10-22: 50 mg via ORAL
  Filled 2019-10-21 (×2): qty 1

## 2019-10-21 MED ORDER — ACETAMINOPHEN 325 MG PO TABS: 650.0000 mg | ORAL_TABLET | Freq: Four times a day (QID) | ORAL | Status: DC | PRN

## 2019-10-21 MED ORDER — MAGNESIUM HYDROXIDE 400 MG/5ML PO SUSP: 30.0000 mL | Freq: Every day | ORAL | Status: DC | PRN

## 2019-10-21 MED ORDER — HYDROXYZINE HCL 25 MG PO TABS
25.0000 mg | ORAL_TABLET | Freq: Three times a day (TID) | ORAL | Status: DC | PRN
  Administered 2019-10-22: 25 mg via ORAL
  Filled 2019-10-21: qty 1
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
## 2019-10-21 NOTE — Progress Notes (Signed)
Patient refused vital signs when he walked in.
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
## 2019-10-22 ENCOUNTER — Encounter (HOSPITAL_COMMUNITY): Payer: Self-pay | Admitting: Nurse Practitioner

## 2019-10-22 ENCOUNTER — Other Ambulatory Visit: Payer: Self-pay

## 2019-10-22 DIAGNOSIS — Z87891 Personal history of nicotine dependence: Secondary | ICD-10-CM | POA: Diagnosis not present

## 2019-10-22 DIAGNOSIS — Z20822 Contact with and (suspected) exposure to covid-19: Secondary | ICD-10-CM | POA: Diagnosis present

## 2019-10-22 DIAGNOSIS — R45851 Suicidal ideations: Secondary | ICD-10-CM | POA: Diagnosis present

## 2019-10-22 DIAGNOSIS — F200 Paranoid schizophrenia: Secondary | ICD-10-CM | POA: Diagnosis present

## 2019-10-22 LAB — HEMOGLOBIN A1C
Hgb A1c MFr Bld: 5 % (ref 4.8–5.6)
Mean Plasma Glucose: 96.8 mg/dL

## 2019-10-22 LAB — COMPREHENSIVE METABOLIC PANEL WITH GFR
ALT: 39 U/L (ref 0–44)
AST: 29 U/L (ref 15–41)
Albumin: 3.9 g/dL (ref 3.5–5.0)
Alkaline Phosphatase: 50 U/L (ref 38–126)
Anion gap: 8 (ref 5–15)
BUN: 12 mg/dL (ref 6–20)
CO2: 27 mmol/L (ref 22–32)
Calcium: 9.3 mg/dL (ref 8.9–10.3)
Chloride: 106 mmol/L (ref 98–111)
Creatinine, Ser: 1.04 mg/dL (ref 0.61–1.24)
GFR calc Af Amer: >60 mL/min
GFR calc non Af Amer: >60 mL/min
Glucose, Bld: 88 mg/dL (ref 70–99)
Potassium: 4.9 mmol/L (ref 3.5–5.1)
Sodium: 141 mmol/L (ref 135–145)
Total Bilirubin: 1.4 mg/dL — ABNORMAL HIGH (ref 0.3–1.2)
Total Protein: 6.3 g/dL — ABNORMAL LOW (ref 6.5–8.1)

## 2019-10-22 LAB — CBC
HCT: 40.7 % (ref 39.0–52.0)
Hemoglobin: 13.2 g/dL (ref 13.0–17.0)
MCH: 32.2 pg (ref 26.0–34.0)
MCHC: 32.4 g/dL (ref 30.0–36.0)
MCV: 99.3 fL (ref 80.0–100.0)
Platelets: 217 K/uL (ref 150–400)
RBC: 4.1 MIL/uL — ABNORMAL LOW (ref 4.22–5.81)
RDW: 14.6 % (ref 11.5–15.5)
WBC: 3.9 K/uL — ABNORMAL LOW (ref 4.0–10.5)
nRBC: 0 % (ref 0.0–0.2)

## 2019-10-22 LAB — LIPID PANEL
Cholesterol: 219 mg/dL — ABNORMAL HIGH (ref 0–200)
HDL: 61 mg/dL (ref >40)
LDL Cholesterol: 137 mg/dL — ABNORMAL HIGH (ref 0–99)
Total CHOL/HDL Ratio: 3.6 RATIO
Triglycerides: 103 mg/dL (ref <150)
VLDL: 21 mg/dL (ref 0–40)

## 2019-10-22 LAB — SARS CORONAVIRUS 2 BY RT PCR (HOSPITAL ORDER, PERFORMED IN ~~LOC~~ HOSPITAL LAB): SARS Coronavirus 2: NEGATIVE

## 2019-10-22 LAB — TSH: TSH: 1.564 u[IU]/mL (ref 0.350–4.500)

## 2019-10-22 MED ORDER — LORAZEPAM 1 MG PO TABS: 1.0000 mg | ORAL_TABLET | ORAL | Status: DC | PRN

## 2019-10-22 MED ORDER — HALOPERIDOL LACTATE 2 MG/ML PO CONC
5.0000 mg | Freq: Two times a day (BID) | ORAL | Status: DC
  Administered 2019-10-23: 5 mg via ORAL
  Filled 2019-10-22 (×6): qty 2.5

## 2019-10-22 MED ORDER — BENZTROPINE MESYLATE 1 MG PO TABS
1.0000 mg | ORAL_TABLET | Freq: Every day | ORAL | Status: DC
  Administered 2019-10-23: 1 mg via ORAL
  Filled 2019-10-22 (×3): qty 1

## 2019-10-22 MED ORDER — ZIPRASIDONE MESYLATE 20 MG IM SOLR: 20.0000 mg | Freq: Two times a day (BID) | INTRAMUSCULAR | Status: DC | PRN

## 2019-10-22 MED ORDER — HALOPERIDOL 5 MG PO TABS
5.0000 mg | ORAL_TABLET | Freq: Two times a day (BID) | ORAL | Status: DC
  Administered 2019-10-22 – 2019-10-24 (×3): 5 mg via ORAL
  Filled 2019-10-22 (×7): qty 1

## 2019-10-22 MED ORDER — LORAZEPAM 2 MG/ML IJ SOLN: 2.0000 mg | Freq: Two times a day (BID) | INTRAMUSCULAR | Status: DC | PRN

## 2019-10-22 MED ORDER — DIPHENHYDRAMINE HCL 50 MG/ML IJ SOLN: 50.0000 mg | Freq: Two times a day (BID) | INTRAMUSCULAR | Status: DC | PRN

## 2019-10-22 NOTE — Progress Notes (Signed)
Patient escorted to inpatient unit on #500 hall without difficulty. Report given to unit nurse. Safety maintained.

## 2019-10-22 NOTE — Progress Notes (Signed)
Pt brought in by [employer] under IVC status with c/o of Paranoia but unsure why he was involuntarily commited. Presents guarded, paranoid/ suspicious. Ambulatory with a steady gait. Denies SI, HI, AVH and pain at this time. Denies substance use. Skin assessment completed and belonging searched per protocol. Left foot big has cut and looks inflamed and with bad odor. Patient refusing care for toe. Patient states he feels like people are out to get him. Patient non compliant with his medication regimen states " I don't need meds". Patient malodorous and disheveled. States he has been homeless for 3 months and has not showered in 3 months. Encouraged to shower and wash clothing but patient refused.  Q 15 minutes safety checks initiated without self harm gestures. Writer encouraged pt to voice concerns. Pt remains safe on unit.

## 2019-10-22 NOTE — Plan of Care (Signed)
[facility] Observation Crisis Plan  Reason for Crisis Plan:  Chronic Mental Illness/Medical Illness   Plan of Care:  Referral for Telepsychiatry/Psychiatric Consult  Family Support:      Current Living Environment:  Living Arrangements: Other (Comment)(Homeless)  Insurance:   Hospital Account    Name Acct ID Class Status Primary Coverage   Jake Neal, Jake Neal [id number] BEHAVIORAL HEALTH OBSERVATION Open [facility] FOR MH/DD/SAS - [place]-WAY [place]        Guarantor Account (for Hospital Account [id number])    Name Relation to Pt Service Area Active? Acct Type   Jake Neal Self [facility] Yes Behavioral Health   Address Phone       [address] [place], [place] [address] [phone](H)          Coverage Information (for Hospital Account [id number])    F/O Payor/Plan Precert #   [facility] FOR MH/DD/SAS/3-WAY [facility]    Subscriber Subscriber #   Jake Neal, Jake Neal [id number]   Address Phone   [address] [place], [place] [address] [phone]      Legal Guardian:  Legal Guardian: Other:(Self)  Primary Care Provider:  System, Pcp Not In  Current Outpatient Providers:  Uknown  Psychiatrist:  Name of Psychiatrist: None  Counselor/Therapist:  Name of Therapist: None  Compliant with Medications:  No  Additional Information:   Jake Neal 5/29/20211:07 AM

## 2019-10-22 NOTE — Progress Notes (Signed)
  COVID-19 Daily Checkoff  Have you had a fever (temp > 37.80C/100F)  in the past 24 hours?  No  If you have had runny nose, nasal congestion, sneezing in the past 24 hours, has it worsened? No  COVID-19 EXPOSURE  Have you traveled outside the state in the past 14 days? No  Have you been in contact with someone with a confirmed diagnosis of COVID-19 or PUI in the past 14 days without wearing appropriate PPE? No  Have you been living in the same home as a person with confirmed diagnosis of COVID-19 or a PUI (household contact)? No  Have you been diagnosed with COVID-19? No               D:  Patient presents calm, cooperative during interview with staff. Patient states, I don't know why my aunt put me here. I haven't spoken with her in a minute now. I never said I was suicidal. That's a lie to whoever said that. I stay by myself in a shelter. The only thing is that I stopped taking my meds because I don't need them anymore". Patient disheveled and presents with poor hygiene. Patient denies SI/HI/AVH when asked. Patient states, "I just want to go on my way. I'm not bothering anyone. I stay to myself". Patient denies any physical complaints when asked.    A: Support and encouragement provided. Staff encouraged patient to shower and wash pt clothing but patient refused. Routine safety checks conducted every 15 minutes per unit protocol. Encouraged patient to notify staff with any needs or concerns. Patient compliant and verbalized agreement.  R: Patient remains safe at this time and verbally contracts for safety. Will continue to monitor.

## 2019-10-22 NOTE — [facility] Group Notes (Signed)
Adult Psychoeducational Group Note  Date:  10/22/2019 Time:  2:43 PM  Group Topic/Focus:  Identifying Needs:   The focus of this group is to help patients identify their personal needs that have been historically problematic and identify healthy behaviors to address their needs.  Participation Level:  Active  Participation Quality:  Appropriate  Affect:  Appropriate  Cognitive:  Appropriate  Insight: Improving  Engagement in Group:  Engaged  Modes of Intervention:  Role-play and Support  Additional Comments:  participating   Dione Housekeeper 10/22/2019, 2:43 PM

## 2019-10-23 MED ORDER — LORAZEPAM 1 MG PO TABS: 2.0000 mg | ORAL_TABLET | Freq: Four times a day (QID) | ORAL | Status: DC | PRN

## 2019-10-23 MED ORDER — LORAZEPAM 2 MG/ML IJ SOLN: 2.0000 mg | Freq: Four times a day (QID) | INTRAMUSCULAR | Status: DC | PRN

## 2019-10-23 MED ORDER — HALOPERIDOL 5 MG PO TABS: 5.0000 mg | ORAL_TABLET | Freq: Four times a day (QID) | ORAL | Status: DC | PRN

## 2019-10-23 MED ORDER — BENZTROPINE MESYLATE 0.5 MG PO TABS
0.5000 mg | ORAL_TABLET | Freq: Two times a day (BID) | ORAL | Status: DC
  Administered 2019-10-23 – 2019-10-25 (×4): 0.5 mg via ORAL
  Filled 2019-10-23 (×3): qty 1
  Filled 2019-10-23: qty 14
  Filled 2019-10-23 (×4): qty 1
  Filled 2019-10-23: qty 14

## 2019-10-23 MED ORDER — HALOPERIDOL LACTATE 5 MG/ML IJ SOLN: 5.0000 mg | Freq: Four times a day (QID) | INTRAMUSCULAR | Status: DC | PRN

## 2019-10-23 NOTE — [facility] Suicide Risk Assessment (Addendum)
[facility] Admission Suicide Risk Assessment   Nursing information obtained from:  Patient Demographic factors:  Male, Adolescent or young adult, Unemployed Current Mental Status:  NA Loss Factors:  NA Historical Factors:  NA Risk Reduction Factors:  NA  Total Time spent with patient: 45 minutes Principal Problem: Schizophrenia, paranoid type (HCC) Diagnosis:  Principal Problem:   Schizophrenia, paranoid type (HCC)  Subjective Data:   Continued Clinical Symptoms:  Alcohol Use Disorder Identification Test Final Score (AUDIT): 0 The "Alcohol Use Disorders Identification Test", Guidelines for Use in Primary Care, Second Edition.  World Science writer [facility]). Score between 0-7:  no or low risk or alcohol related problems. Score between 8-15:  moderate risk of alcohol related problems. Score between 16-19:  high risk of alcohol related problems. Score 20 or above:  warrants further diagnostic evaluation for alcohol dependence and treatment.   CLINICAL FACTORS:  29 year old male, no children, currently homeless, unemployed. Presented under IVC initiated by family member.  IVC states ""Respondent is schizophrenic and does not take medication. Was last committed 2 months ago. Respondent says he will kill himself by walking into traffic. Respondent believes "agents" are after him and has been living homeless. Believe aunt who is IVC'ing him is dead. Has been missing for 2 weeks."  At this time he presents limited historian.  He states he does not understand why he is in the hospital.  Reports "I am homeless, I am okay with being homeless, so nobody should need to worry about me".  Acknowledges he has "bad thoughts" but reiterates that he is homeless and that this is his choice and that he prefers to keep to himself .  States "I do not bother anybody".  At this time he does not elaborate on what he means by that thoughts, but does deny any violent or homicidal ideations. He denies feeling depressed  and currently does not endorse significant neurovegetative symptoms. Denies medical illnesses.  NKDA.  He reports he is not taking any psychiatric medications at this time. Denies alcohol or drug abuse.  Reports past history of cannabis use but states he has not used recently. Patient is known to [facility] from prior psychiatric admission in March 2020 and ED visits.  In March she had presented for mood instability, paranoia, aggressive behavior towards family members.  At that time he was discharged on Prozac, p.o. haloperidol, IM Haldol decanoate, Minipress and trazodone.  Most recently he had been evaluated in ED on April 24/2021, at which time he presented for suicidal ideations and disorganized thought process.  He was diagnosed with schizophrenia, PTSD and was discharged on Zyprexa 5 mg nightly. Labs reviewed-cholesterol mildly elevated at 219,  WBC 3.9 ( down from 9.4 in April) , TSH 1.56, hemoglobin A1c 5.0.  Recent EKG4.23 QTc 437.   Dx- Schizophrenia by history   Plan-inpatient admission. Patient has been started on Haldol 5 mg twice daily, Cogentin 0.5 mgrs BID  to minimize risk of EPS . Haldol/ Ativan PRN for acute agitation if needed .  Musculoskeletal: Strength & Muscle Tone: within normal limits Gait & Station: normal Patient leans: N/A  Psychiatric Specialty Exam: Physical Exam  Review of Systems denies headache, no chest pain, no shortness of breath, no nausea, no vomiting  Height 5\' 10"  (1.778 m), weight 65.8 kg.Body mass index is 20.81 kg/m.  General Appearance: Disheveled  Eye Contact:  Fair  Speech:  Normal Rate  Volume:  Decreased  Mood:  Denies feeling depressed, states mood is "all right"  Affect:  Blunted  Thought Process:  Disorganized and Descriptions of Associations: Circumstantial/perseverative  Orientation:  Other:  Fully alert and attentive  Thought Content:  Currently patient is denying hallucinations.  Does not appear internally preoccupied at this time.   Describes having "bad thoughts" but did not elaborate  Suicidal Thoughts:  No denies suicidal or self-injurious ideations, denies homicidal or violent ideations, currently contract for safety on unit  Homicidal Thoughts:  No  Memory:  Recent and remote fair  Judgement:  Other:  Limited  Insight:  Limited  Psychomotor Activity:  Normal-no current psychomotor agitation or restlessness  Concentration:  Concentration: Fair and Attention Span: Fair  Recall:  [employer] of Knowledge:  Fair  Language:  Fair  Akathisia:  Negative  Handed:  Right  AIMS (if indicated):     Assets:  Communication Skills Desire for Improvement Resilience  ADL's: Fair  Cognition:  WNL  Sleep:  Number of Hours: 6.75      COGNITIVE FEATURES THAT CONTRIBUTE TO RISK:  Closed-mindedness, Loss of executive function and Polarized thinking    SUICIDE RISK:   Moderate:  Frequent suicidal ideation with limited intensity, and duration, some specificity in terms of plans, no associated intent, good self-control, limited dysphoria/symptomatology, some risk factors present, and identifiable protective factors, including available and accessible social support.  PLAN OF CARE: Patient will be admitted to inpatient psychiatric unit for stabilization and safety. Will provide and encourage milieu participation. Provide medication management and maked adjustments as needed.  Will follow daily.    I certify that inpatient services furnished can reasonably be expected to improve the patient's condition.   Jenne Campus, MD 10/23/2019, 4:18 PM

## 2019-10-23 NOTE — Plan of Care (Signed)
Progress note  D: pt found in bed; compliant with medication administration. Pt denies any physical complaints or pain. Pt is guarded and minimal on approach but pleasant. Pt has been reclusive to their room. Pt continues to have poor hygiene. Pt denies si/hi/ah/vh and verbally agrees to approach staff if these become apparent or before harming themself/others while at [facility].  A: Pt provided support and encouragement. Pt given medication per protocol and standing orders. Q84m safety checks implemented and continued.  R: Pt safe on the unit. Will continue to monitor.  Pt progressing in the following metrics  Problem: Education: Goal: Knowledge of [place] [facility] Education information/materials will improve Outcome: Progressing Goal: Emotional status will improve Outcome: Progressing Goal: Mental status will improve Outcome: Progressing Goal: Verbalization of understanding the information provided will improve Outcome: Progressing

## 2019-10-23 NOTE — [facility] Suicide Risk Assessment (Signed)
[facility] INPATIENT:  Family/Significant Other Suicide Prevention Education  Suicide Prevention Education:  Patient Refusal for Family/Significant Other Suicide Prevention Education: The patient Jake Neal has refused to provide written consent for family/significant other to be provided Family/Significant Other Suicide Prevention Education during admission and/or prior to discharge.  Physician notified.  Carloyn Jaeger Grossman-Orr 10/23/2019, 4:34 PM

## 2019-10-23 NOTE — H&P (Signed)
Psychiatric Admission Assessment Adult  Patient Identification: Jake Neal MRN:  [id number] Date of Evaluation:  10/23/2019 Chief Complaint:  Schizophrenia (HCC) [F20.9] Schizophrenia, paranoid type (HCC) [F20.0] Principal Diagnosis: Schizophrenia, paranoid type (HCC) Diagnosis:  Principal Problem:   Schizophrenia, paranoid type (HCC)  History of Present Illness: Jake Neal is a 29 year old male with history of schizophrenia who presented under IVC from his aunt. Per IVC paperwork, patient has been off medications and has threatened to kill himself. He has been paranoid that "agents" are pursuing him. He believes that his aunt who commit him is dead. The aunt reports that patient has been living on the streets because he believes that she is dead, and he is afraid that people want to hurt him. On assessment today, patient is a limited historian and guarded with minimal eye contact. He states he does not know why the police took him to the hospital. He is requesting discharge. Per prior notes, patient had reported symptoms of paranoia to [name] staff. He also reported AH of voices telling him negative things at that time but denied CAH. He denies drug use. UDS is pending. Currently denies SI/HI/AVH.  Associated Signs/Symptoms: Depression Symptoms:  denies (Hypo) Manic Symptoms:  denies Anxiety Symptoms:  Excessive Worry, Psychotic Symptoms:  Delusions, Hallucinations: Auditory Paranoia, PTSD Symptoms: Negative Total Time spent with patient: 30 minutes  Past Psychiatric History: History of schizophrenia with multiple prior hospitalizations. He was last admitted to [facility] for observation 09/17/19-09/18/19 and discharged on Zyprexa.   Is the patient at risk to self? Yes.    Has the patient been a risk to self in the past 6 months? Yes.    Has the patient been a risk to self within the distant past? Yes.    Is the patient a risk to others? No.  Has the patient been a risk to others in the past 6  months? No.  Has the patient been a risk to others within the distant past? No.   Prior Inpatient Therapy: Prior Inpatient Therapy: Yes Prior Therapy Dates: 2020, 2018 Prior Therapy Facilty/Provider(s): [facility] Reason for Treatment: Psychosis Prior Outpatient Therapy: Prior Outpatient Therapy: Yes Prior Therapy Dates: 2020 Prior Therapy Facilty/Provider(s): [facility] Reason for Treatment: Med mang Does patient have an ACCT team?: No Does patient have Intensive In-House Services?  : No Does patient have [facility] services? : No Does patient have P4CC services?: No  Alcohol Screening: 1. How often do you have a drink containing alcohol?: Never 2. How many drinks containing alcohol do you have on a typical day when you are drinking?: 1 or 2 3. How often do you have six or more drinks on one occasion?: Never AUDIT-C Score: 0 4. How often during the last year have you found that you were not able to stop drinking once you had started?: Never 5. How often during the last year have you failed to do what was normally expected from you because of drinking?: Never 6. How often during the last year have you needed a first drink in the morning to get yourself going after a heavy drinking session?: Never 7. How often during the last year have you had a feeling of guilt of remorse after drinking?: Never 8. How often during the last year have you been unable to remember what happened the night before because you had been drinking?: Never 9. Have you or someone else been injured as a result of your drinking?: No 10. Has a relative or friend or a  doctor or another health worker been concerned about your drinking or suggested you cut down?: No Alcohol Use Disorder Identification Test Final Score (AUDIT): 0 Substance Abuse History in the last 12 months:  No. Consequences of Substance Abuse: NA Previous Psychotropic Medications: Yes  Psychological Evaluations: No  Past Medical History:  Past Medical  History:  Diagnosis Date  . Medical history non-contributory    History reviewed. No pertinent surgical history. Family History: History reviewed. No pertinent family history. Family Psychiatric  History: Unknown Tobacco Screening:   Social History:  Social History   Substance and Sexual Activity  Alcohol Use No     Social History   Substance and Sexual Activity  Drug Use Yes  . Types: Marijuana    Additional Social History: Marital status: Single    Pain Medications: Denies abuse Prescriptions: Denies abuse Over the Counter: Denies abuse History of alcohol / drug use?: No history of alcohol / drug abuse Longest period of sobriety (when/how long): NA                    Allergies:  No Known Allergies Lab Results:  Results for orders placed or performed during the hospital encounter of 10/21/19 (from the past 48 hour(s))  SARS Coronavirus 2 by RT PCR (hospital order, performed in [facility] hospital lab) Nasopharyngeal Nasopharyngeal Swab     Status: None   Collection Time: 10/21/19 10:37 PM   Specimen: Nasopharyngeal Swab  Result Value Ref Range   SARS Coronavirus 2 NEGATIVE NEGATIVE    Comment: (NOTE) SARS-CoV-2 target nucleic acids are NOT DETECTED. The SARS-CoV-2 RNA is generally detectable in upper and lower respiratory specimens during the acute phase of infection. The lowest concentration of SARS-CoV-2 viral copies this assay can detect is 250 copies / mL. A negative result does not preclude SARS-CoV-2 infection and should not be used as the sole basis for treatment or other patient management decisions.  A negative result may occur with improper specimen collection / handling, submission of specimen other than nasopharyngeal swab, presence of viral mutation(s) within the areas targeted by this assay, and inadequate number of viral copies (<250 copies / mL). A negative result must be combined with clinical observations, patient history, and  epidemiological information. Fact Sheet for Patients:   BoilerBrush.com.cyhttps://www.fda.gov/media/136312/download Fact Sheet for Healthcare Providers: https://pope.com/https://www.fda.gov/media/136313/download This test is not yet approved or cleared  by the [place] FDA and has been authorized for detection and/or diagnosis of SARS-CoV-2 by FDA under an Emergency Use Authorization (EUA).  This EUA will remain in effect (meaning this test can be used) for the duration of the COVID-19 declaration under Section 564(b)(1) of the Act, 21 U.S.C. section 360bbb-3(b)(1), unless the authorization is terminated or revoked sooner. Performed at [facility], [address] [place], [place] [address]   CBC     Status: Abnormal   Collection Time: 10/22/19  7:16 AM  Result Value Ref Range   WBC 3.9 (L) 4.0 - 10.5 K/uL   RBC 4.10 (L) 4.22 - 5.81 MIL/uL   Hemoglobin 13.2 13.0 - 17.0 g/dL   HCT 69.640.7 29.539.0 - 28.452.0 %   MCV 99.3 80.0 - 100.0 fL   MCH 32.2 26.0 - 34.0 pg   MCHC 32.4 30.0 - 36.0 g/dL   RDW 13.214.6 44.011.5 - 10.215.5 %   Platelets 217 150 - 400 K/uL   nRBC 0.0 0.0 - 0.2 %    Comment: Performed at [facility], [address] [place], [place] [address]  Comprehensive metabolic panel     Status: Abnormal   Collection Time: 10/22/19  7:16 AM  Result Value Ref Range   Sodium 141 135 - 145 mmol/L   Potassium 4.9 3.5 - 5.1 mmol/L   Chloride 106 98 - 111 mmol/L   CO2 27 22 - 32 mmol/L   Glucose, Bld 88 70 - 99 mg/dL    Comment: Glucose reference range applies only to samples taken after fasting for at least 8 hours.   BUN 12 6 - 20 mg/dL   Creatinine, Ser 1.04 0.61 - 1.24 mg/dL   Calcium 9.3 8.9 - 10.3 mg/dL   Total Protein 6.3 (L) 6.5 - 8.1 g/dL   Albumin 3.9 3.5 - 5.0 g/dL   AST 29 15 - 41 U/L   ALT 39 0 - 44 U/L   Alkaline Phosphatase 50 38 - 126 U/L   Total Bilirubin 1.4 (H) 0.3 - 1.2 mg/dL   GFR calc non Af Amer >60 >60 mL/min   GFR calc Af Amer >60 >60 mL/min   Anion gap 8 5  - 15    Comment: Performed at [facility], [place] [address], [place], [place] [address]  Lipid panel     Status: Abnormal   Collection Time: 10/22/19  7:16 AM  Result Value Ref Range   Cholesterol 219 (H) 0 - 200 mg/dL   Triglycerides 103 <150 mg/dL   HDL 61 >40 mg/dL   Total CHOL/HDL Ratio 3.6 RATIO   VLDL 21 0 - 40 mg/dL   LDL Cholesterol 137 (H) 0 - 99 mg/dL    Comment:        Total Cholesterol/HDL:CHD Risk Coronary Heart Disease Risk Table                     Men   Women  1/2 Average Risk   3.4   3.3  Average Risk       5.0   4.4  2 X Average Risk   9.6   7.1  3 X Average Risk  23.4   11.0        Use the calculated Patient Ratio above and the CHD Risk Table to determine the patient's CHD Risk.        ATP III CLASSIFICATION (LDL):  <100     mg/dL   Optimal  100-129  mg/dL   Near or Above                    Optimal  130-159  mg/dL   Borderline  160-189  mg/dL   High  >190     mg/dL   Very High Performed at [facility] [address], [place], [place] [address]   Hemoglobin A1c     Status: None   Collection Time: 10/22/19  7:16 AM  Result Value Ref Range   Hgb A1c MFr Bld 5.0 4.8 - 5.6 %    Comment: (NOTE) Pre diabetes:          5.7%-6.4% Diabetes:              >6.4% Glycemic control for   <7.0% adults with diabetes    Mean Plasma Glucose 96.8 mg/dL    Comment: Performed at [facility] [address], [place], [place] [address]  TSH     Status: None   Collection Time: 10/22/19  7:16 AM  Result Value Ref Range  TSH 1.564 0.350 - 4.500 uIU/mL    Comment: Performed by a 3rd Generation assay with a functional sensitivity of <=0.01 uIU/mL. Performed at [facility], [address] [place], [place] [address]     Blood Alcohol level:  Lab Results  Component Value Date   ETH <10 07/31/2018   ETH <5 07/04/2016    Metabolic Disorder Labs:  Lab Results  Component Value Date   HGBA1C 5.0  10/22/2019   MPG 96.8 10/22/2019   MPG 93.93 08/03/2018   Lab Results  Component Value Date   PROLACTIN 9.9 07/06/2016   PROLACTIN 14.0 07/04/2016   Lab Results  Component Value Date   CHOL 219 (H) 10/22/2019   TRIG 103 10/22/2019   HDL 61 10/22/2019   CHOLHDL 3.6 10/22/2019   VLDL 21 10/22/2019   LDLCALC 137 (H) 10/22/2019   LDLCALC 138 (H) 08/03/2018    Current Medications: Current Facility-Administered Medications  Medication Dose Route Frequency Provider Last Rate Last Admin  . acetaminophen (TYLENOL) tablet 650 mg  650 mg Oral Q6H PRN Nira Conn A, NP      . alum & mag hydroxide-simeth (MAALOX/MYLANTA) 200-200-20 MG/5ML suspension 30 mL  30 mL Oral Q4H PRN Nira Conn A, NP      . benztropine (COGENTIN) tablet 1 mg  1 mg Oral Daily Charm Rings, NP   1 mg at 10/23/19 1914  . diphenhydrAMINE (BENADRYL) injection 50 mg  50 mg Intramuscular BID PRN Charm Rings, NP      . haloperidol (HALDOL) tablet 5 mg  5 mg Oral BID Charm Rings, NP   5 mg at 10/22/19 1731   Or  . haloperidol (HALDOL) 2 MG/ML solution 5 mg  5 mg Oral BID Charm Rings, NP   5 mg at 10/23/19 7829  . hydrOXYzine (ATARAX/VISTARIL) tablet 25 mg  25 mg Oral TID PRN Jackelyn Poling, NP   25 mg at 10/22/19 2105  . LORazepam (ATIVAN) injection 2 mg  2 mg Intramuscular BID PRN Charm Rings, NP      . magnesium hydroxide (MILK OF MAGNESIA) suspension 30 mL  30 mL Oral Daily PRN Nira Conn A, NP      . traZODone (DESYREL) tablet 50 mg  50 mg Oral QHS PRN Jackelyn Poling, NP   50 mg at 10/22/19 2105   PTA Medications: Medications Prior to Admission  Medication Sig Dispense Refill Last Dose  . OLANZapine (ZYPREXA) 5 MG tablet Take 1 tablet (5 mg total) by mouth at bedtime. (Patient not taking: Reported on 10/22/2019) 30 tablet 0 Not Taking at Unknown time    Musculoskeletal: Strength & Muscle Tone: within normal limits Gait & Station: normal Patient leans: N/A  Psychiatric Specialty  Exam: Physical Exam  Nursing note and vitals reviewed. Constitutional: He is oriented to person, place, and time. He appears well-developed and well-nourished.  Cardiovascular: Normal rate.  Respiratory: Effort normal.  Neurological: He is alert and oriented to person, place, and time.    Review of Systems  Constitutional: Negative.   Respiratory: Negative for cough and shortness of breath.   Psychiatric/Behavioral: Positive for hallucinations. Negative for agitation, behavioral problems, dysphoric mood, self-injury, sleep disturbance and suicidal ideas. The patient is not nervous/anxious and is not hyperactive.     Height 5\' 10"  (1.778 m), weight 65.8 kg.Body mass index is 20.81 kg/m.  General Appearance: Disheveled  Eye Contact:  Minimal  Speech:  Slow  Volume:  Decreased  Mood:  Euthymic  Affect:  Flat  Thought Process:  Descriptions of Associations: Circumstantial  Orientation:  Full (Time, Place, and Person)  Thought Content:  Delusions and Paranoid Ideation  Suicidal Thoughts:  No  Homicidal Thoughts:  No  Memory:  Immediate;   Fair Recent;   Fair Remote;   Fair  Judgement:  Impaired  Insight:  Lacking  Psychomotor Activity:  Normal  Concentration:  Concentration: Fair and Attention Span: Fair  Recall:  [employer] of Knowledge:  Fair  Language:  Fair  Akathisia:  No  Handed:  Right  AIMS (if indicated):     Assets:  Resilience Social Support  ADL's:  Intact  Cognition:  WNL  Sleep:  Number of Hours: 6.75    Treatment Plan Summary: Daily contact with patient to assess and evaluate symptoms and progress in treatment and Medication management   Inpatient hospitalization.  See MD's admission SRA for medication management.  Patient will participate in the therapeutic group milieu.  Discharge disposition in progress.   Observation Level/Precautions:  15 minute checks  Laboratory:  UDS  Psychotherapy:  Group therapy  Medications:  See MAR  Consultations:   PRN  Discharge Concerns:  Safety and stabilization  Estimated LOS: 3-5 days  Other:     Physician Treatment Plan for Primary Diagnosis: Schizophrenia, paranoid type (HCC) Long Term Goal(s): Improvement in symptoms so as ready for discharge  Short Term Goals: Ability to identify changes in lifestyle to reduce recurrence of condition will improve, Ability to verbalize feelings will improve and Ability to disclose and discuss suicidal ideas  Physician Treatment Plan for Secondary Diagnosis: Principal Problem:   Schizophrenia, paranoid type (HCC)  Long Term Goal(s): Improvement in symptoms so as ready for discharge  Short Term Goals: Ability to demonstrate self-control will improve and Ability to identify and develop effective coping behaviors will improve  I certify that inpatient services furnished can reasonably be expected to improve the patient's condition.    Aldean Baker, NP 5/30/20213:23 PM

## 2019-10-23 NOTE — Progress Notes (Signed)
   10/22/19 1935  Psych Admission Type (Psych Patients Only)  Admission Status Involuntary  Psychosocial Assessment  Patient Complaints None  Eye Contact Fair  Facial Expression Flat;Pensive  Affect Apprehensive;Preoccupied  Speech Logical/coherent  Interaction Assertive  Motor Activity Other (Comment) (WNL)  Appearance/Hygiene Poor hygiene  Behavior Characteristics Cooperative;Appropriate to situation  Mood Preoccupied;Pleasant  Thought Process  Coherency WDL  Content WDL  Delusions None reported or observed  Perception WDL  Hallucination None reported or observed  Judgment Limited  Confusion None  Danger to Self  Current suicidal ideation? Denies  Danger to Others  Danger to Others None reported or observed   Pt A&O. In [place] but no interaction with peers. Fixated on talking to dr re: discharge. Meds per [facility], needs met as able, monitoring for safety continues.
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
## 2019-10-23 NOTE — Progress Notes (Signed)
Adult Psychoeducational Group Note  Date:  10/23/2019 Time:  1:37 AM  Group Topic/Focus:  Wrap-Up Group:   The focus of this group is to help patients review their daily goal of treatment and discuss progress on daily workbooks.  Participation Level:  Did Not Attend  Participation Quality:  Did Not Attend  Affect:  Did Not Attend  Cognitive:  Did Not Attend  Insight: None  Engagement in Group:  Did Not Attend  Modes of Intervention:  Did Not Attend  Additional Comments:  Pt did not attend evening wrap up group tonight.  Felipa Furnace 10/23/2019, 1:37 AM

## 2019-10-24 LAB — RAPID URINE DRUG SCREEN, HOSP PERFORMED
Amphetamines: NOT DETECTED
Barbiturates: NOT DETECTED
Benzodiazepines: NOT DETECTED
Cocaine: NOT DETECTED
Opiates: NOT DETECTED
Tetrahydrocannabinol: NOT DETECTED

## 2019-10-24 MED ORDER — HALOPERIDOL 5 MG PO TABS
10.0000 mg | ORAL_TABLET | Freq: Every day | ORAL | Status: DC
  Administered 2019-10-24: 10 mg via ORAL
  Filled 2019-10-24 (×4): qty 2

## 2019-10-24 MED ORDER — HALOPERIDOL 5 MG PO TABS
5.0000 mg | ORAL_TABLET | Freq: Every day | ORAL | Status: DC
  Administered 2019-10-25: 5 mg via ORAL
  Filled 2019-10-24: qty 21
  Filled 2019-10-24 (×2): qty 1

## 2019-10-24 NOTE — Progress Notes (Signed)
   10/23/19 2110  Psych Admission Type (Psych Patients Only)  Admission Status Involuntary  Psychosocial Assessment  Patient Complaints Sadness  Eye Contact Fair  Facial Expression Sad  Affect Preoccupied;Sad;Depressed;Flat  Speech Logical/coherent  Interaction Guarded;Minimal  Motor Activity Other (Comment) (WNL)  Appearance/Hygiene Poor hygiene  Behavior Characteristics Cooperative;Appropriate to situation;Calm  Mood Depressed;Pleasant;Sad  Thought Process  Coherency WDL  Content Blaming others  Delusions None reported or observed  Perception WDL  Hallucination None reported or observed  Judgment Poor  Confusion None  Danger to Self  Current suicidal ideation? Denies  Danger to Others  Danger to Others None reported or observed

## 2019-10-24 NOTE — Progress Notes (Signed)
[facility] MD Progress Note  10/24/2019 11:30 AM Jake Neal  MRN:  [id number] Subjective: Patient is a 29 year old male with a past psychiatric history significant for schizophrenia who presented under involuntary commitment from his aunt.  This was on 10/23/2019.  The patient was off his medications and had threatened to kill himself.  He was noted to be paranoid.  He was admitted to the hospital for evaluation and stabilization.  Objective: Patient is seen and examined.  Patient is a 29 year old male with the above-stated past psychiatric history who is seen in follow-up.  He remains somewhat paranoid today.  He denied auditory hallucinations today.  He is guarded.  He continues to request discharge.  He does not believe there is anything wrong with him.  He was started on Haldol 5 mg p.o. twice daily.  He slept approximately 6 hours last night.  He was last seen by our service in consultation.  This was on 09/18/2019.  Diagnosis at that time was paranoid schizophrenia, substance-induced psychotic disorder.  He was discharged on Zyprexa.  Patient stated he did not take that medication afterwards.  Review of his laboratories on admission showed essentially normal electrolytes, essentially normal CBC.  TSH was normal.  Drug screen was negative.  He denied suicidal or homicidal ideation today.  Principal Problem: Schizophrenia, paranoid type (HCC) Diagnosis: Principal Problem:   Schizophrenia, paranoid type (HCC)  Total Time spent with patient: 20 minutes  Past Psychiatric History: See admission H&P  Past Medical History:  Past Medical History:  Diagnosis Date  . Medical history non-contributory    History reviewed. No pertinent surgical history. Family History: History reviewed. No pertinent family history. Family Psychiatric  History: See admission H&P Social History:  Social History   Substance and Sexual Activity  Alcohol Use No     Social History   Substance and Sexual Activity  Drug  Use Yes  . Types: Marijuana    Social History   Socioeconomic History  . Marital status: Single    Spouse name: Not on file  . Number of children: Not on file  . Years of education: Not on file  . Highest education level: Not on file  Occupational History  . Not on file  Tobacco Use  . Smoking status: Former Games developer  . Smokeless tobacco: Never Used  Substance and Sexual Activity  . Alcohol use: No  . Drug use: Yes    Types: Marijuana  . Sexual activity: Yes    Birth control/protection: None  Other Topics Concern  . Not on file  Social History Narrative  . Not on file   Social Determinants of Health   Financial Resource Strain:   . Difficulty of Paying Living Expenses:   Food Insecurity:   . Worried About Programme researcher, broadcasting/film/video in the Last Year:   . Barista in the Last Year:   Transportation Needs:   . Freight forwarder (Medical):   Marland Kitchen Lack of Transportation (Non-Medical):   Physical Activity:   . Days of Exercise per Week:   . Minutes of Exercise per Session:   Stress:   . Feeling of Stress :   Social Connections:   . Frequency of Communication with Friends and Family:   . Frequency of Social Gatherings with Friends and Family:   . Attends Religious Services:   . Active Member of Clubs or Organizations:   . Attends Banker Meetings:   Marland Kitchen Marital Status:    Additional Social  History:    Pain Medications: Denies abuse Prescriptions: Denies abuse Over the Counter: Denies abuse History of alcohol / drug use?: No history of alcohol / drug abuse Longest period of sobriety (when/how long): NA                    Sleep: Good  Appetite:  Good  Current Medications: Current Facility-Administered Medications  Medication Dose Route Frequency Provider Last Rate Last Admin  . acetaminophen (TYLENOL) tablet 650 mg  650 mg Oral Q6H PRN Jackelyn Poling, NP      . alum & mag hydroxide-simeth (MAALOX/MYLANTA) 200-200-20 MG/5ML suspension 30 mL   30 mL Oral Q4H PRN Nira Conn A, NP      . benztropine (COGENTIN) tablet 0.5 mg  0.5 mg Oral BID Cobos, Rockey Situ, MD   0.5 mg at 10/24/19 4166  . haloperidol (HALDOL) tablet 10 mg  10 mg Oral QHS Antonieta Pert, MD      . haloperidol (HALDOL) tablet 5 mg  5 mg Oral Q6H PRN Cobos, Rockey Situ, MD       Or  . haloperidol lactate (HALDOL) injection 5 mg  5 mg Intramuscular Q6H PRN Cobos, Rockey Situ, MD      . Melene Muller ON 10/25/2019] haloperidol (HALDOL) tablet 5 mg  5 mg Oral Daily Antonieta Pert, MD      . LORazepam (ATIVAN) tablet 2 mg  2 mg Oral Q6H PRN Cobos, Rockey Situ, MD       Or  . LORazepam (ATIVAN) injection 2 mg  2 mg Intramuscular Q6H PRN Cobos, Fernando A, MD      . magnesium hydroxide (MILK OF MAGNESIA) suspension 30 mL  30 mL Oral Daily PRN Nira Conn A, NP      . traZODone (DESYREL) tablet 50 mg  50 mg Oral QHS PRN Jackelyn Poling, NP   50 mg at 10/22/19 2105    Lab Results: No results found for this or any previous visit (from the past 48 hour(s)).  Blood Alcohol level:  Lab Results  Component Value Date   ETH <10 07/31/2018   ETH <5 07/04/2016    Metabolic Disorder Labs: Lab Results  Component Value Date   HGBA1C 5.0 10/22/2019   MPG 96.8 10/22/2019   MPG 93.93 08/03/2018   Lab Results  Component Value Date   PROLACTIN 9.9 07/06/2016   PROLACTIN 14.0 07/04/2016   Lab Results  Component Value Date   CHOL 219 (H) 10/22/2019   TRIG 103 10/22/2019   HDL 61 10/22/2019   CHOLHDL 3.6 10/22/2019   VLDL 21 10/22/2019   LDLCALC 137 (H) 10/22/2019   LDLCALC 138 (H) 08/03/2018    Physical Findings: AIMS: Facial and Oral Movements Muscles of Facial Expression: None, normal Lips and Perioral Area: None, normal Jaw: None, normal Tongue: None, normal,Extremity Movements Upper (arms, wrists, hands, fingers): None, normal Lower (legs, knees, ankles, toes): None, normal, Trunk Movements Neck, shoulders, hips: None, normal, Overall Severity Severity of  abnormal movements (highest score from questions above): None, normal Incapacitation due to abnormal movements: None, normal Patient's awareness of abnormal movements (rate only patient's report): No Awareness, Dental Status Current problems with teeth and/or dentures?: No Does patient usually wear dentures?: No  CIWA:  CIWA-Ar Total: 0 COWS:     Musculoskeletal: Strength & Muscle Tone: within normal limits Gait & Station: normal Patient leans: N/A  Psychiatric Specialty Exam: Physical Exam  Nursing note and vitals reviewed. Constitutional: He appears well-developed  and well-nourished.  HENT:  Head: Normocephalic and atraumatic.  Respiratory: Effort normal.  Neurological: He is alert.    Review of Systems  Height 5\' 10"  (1.778 m), weight 65.8 kg.Body mass index is 20.81 kg/m.  General Appearance: Disheveled  Eye Contact:  Minimal  Speech:  Normal Rate  Volume:  Decreased  Mood:  Dysphoric  Affect:  Flat  Thought Process:  Goal Directed and Descriptions of Associations: Loose  Orientation:  Negative  Thought Content:  Delusions and Paranoid Ideation  Suicidal Thoughts:  No  Homicidal Thoughts:  No  Memory:  Immediate;   Poor Recent;   Poor Remote;   Poor  Judgement:  Impaired  Insight:  Lacking  Psychomotor Activity:  Normal  Concentration:  Concentration: Fair and Attention Span: Fair  Recall:  [employer] of Knowledge:  Fair  Language:  Fair  Akathisia:  Negative  Handed:  Right  AIMS (if indicated):     Assets:  Desire for Improvement Resilience  ADL's:  Impaired  Cognition:  WNL  Sleep:  Number of Hours: 6.25     Treatment Plan Summary: Daily contact with patient to assess and evaluate symptoms and progress in treatment, Medication management and Plan : Patient is seen and examined.  Patient is a 29 year old male with the above-stated past psychiatric history who is seen in follow-up.   Diagnosis: 1.  Schizophrenia  Pertinent findings on examination  today: 1.  Continued paranoia  Plan: 1.  Increase haloperidol to 10 mg p.o. twice daily. 2.  Continue trazodone 50 mg p.o. nightly as needed insomnia. 3.  Continue Cogentin 0.5 mg p.o. twice daily for side effects of medication. 4.  Disposition planning-in progress.  Sharma Covert, MD 10/24/2019, 11:30 AM

## 2019-10-24 NOTE — Progress Notes (Signed)
   10/24/19 1012  Psych Admission Type (Psych Patients Only)  Admission Status Involuntary  Psychosocial Assessment  Patient Complaints Sadness  Eye Contact Fair  Facial Expression Sad;Flat  Affect Preoccupied;Sad;Depressed;Flat  Speech Logical/coherent  Interaction Guarded;Minimal  Motor Activity Other (Comment) (WNL)  Appearance/Hygiene Poor hygiene  Behavior Characteristics Cooperative  Mood Depressed;Sad  Thought Process  Coherency WDL  Content Blaming others  Delusions None reported or observed  Perception WDL  Hallucination None reported or observed  Judgment Poor  Confusion None  Danger to Self  Current suicidal ideation? Denies  Self-Injurious Behavior No self-injurious ideation or behavior indicators observed or expressed   Agreement Not to Harm Self Yes  Description of Agreement Pt verbally contracts for safety  Danger to Others  Danger to Others None reported or observed   Pt A & O X3. Visible in dayroom on initial interaction and for majority of this shift. Minimal interactions noted with others on as need basis. Presents guarded with flat affect and depressed mood.Continue to refuse to attend to his personal hygiene even with writer standing at the room door while takes a shower.  Appears to be reoccupied with being touched while in prison "no, I'm just not taking a shower here, I have my reasons why and no one is going to touch or see me. I already hear what they are saying about me, I know what these they are trying to do".  Emotional support offered to pt as needed throughout this shift. Scheduled medications offered as ordered with verbal education and effects monitored. Q 15 minutes safety checks maintained on and off unit without self harm gestures to note thus far. Remains isolative and guarded for most of this shift. Denies concerns at this time.

## 2019-10-24 NOTE — Tx Team (Signed)
Interdisciplinary Treatment and Diagnostic Plan Update  10/24/2019 Time of Session: 9:00am WRIGHT GRAVELY MRN: [id number]  Principal Diagnosis: Schizophrenia, paranoid type [facility])  Secondary Diagnoses: Principal Problem:   Schizophrenia, paranoid type ([place])   Current Medications:  Current Facility-Administered Medications  Medication Dose Route Frequency Provider Last Rate Last Admin  . acetaminophen (TYLENOL) tablet 650 mg  650 mg Oral Q6H PRN Rozetta Nunnery, NP      . alum & mag hydroxide-simeth (MAALOX/MYLANTA) 200-200-20 MG/5ML suspension 30 mL  30 mL Oral Q4H PRN Lindon Romp A, NP      . benztropine (COGENTIN) tablet 0.5 mg  0.5 mg Oral BID Cobos, Myer Peer, MD   0.5 mg at 10/24/19 3664  . haloperidol (HALDOL) tablet 10 mg  10 mg Oral QHS Sharma Covert, MD      . haloperidol (HALDOL) tablet 5 mg  5 mg Oral Q6H PRN Cobos, Myer Peer, MD       Or  . haloperidol lactate (HALDOL) injection 5 mg  5 mg Intramuscular Q6H PRN Cobos, Myer Peer, MD      . Derrill Memo ON 10/25/2019] haloperidol (HALDOL) tablet 5 mg  5 mg Oral Daily Sharma Covert, MD      . LORazepam (ATIVAN) tablet 2 mg  2 mg Oral Q6H PRN Cobos, Myer Peer, MD       Or  . LORazepam (ATIVAN) injection 2 mg  2 mg Intramuscular Q6H PRN Cobos, Fernando A, MD      . magnesium hydroxide (MILK OF MAGNESIA) suspension 30 mL  30 mL Oral Daily PRN Lindon Romp A, NP      . traZODone (DESYREL) tablet 50 mg  50 mg Oral QHS PRN Rozetta Nunnery, NP   50 mg at 10/22/19 2105   PTA Medications: Medications Prior to Admission  Medication Sig Dispense Refill Last Dose  . OLANZapine (ZYPREXA) 5 MG tablet Take 1 tablet (5 mg total) by mouth at bedtime. (Patient not taking: Reported on 10/22/2019) 30 tablet 0 Not Taking at Unknown time    Patient Stressors:    Patient Strengths:    Treatment Modalities: Medication Management, Group therapy, Case management,  1 to 1 session with clinician, Psychoeducation, Recreational  therapy.   Physician Treatment Plan for Primary Diagnosis: Schizophrenia, paranoid type ([place]) Long Term Goal(s): Improvement in symptoms so as ready for discharge Improvement in symptoms so as ready for discharge   Short Term Goals: Ability to identify changes in lifestyle to reduce recurrence of condition will improve Ability to verbalize feelings will improve Ability to disclose and discuss suicidal ideas Ability to demonstrate self-control will improve Ability to identify and develop effective coping behaviors will improve  Medication Management: Evaluate patient's response, side effects, and tolerance of medication regimen.  Therapeutic Interventions: 1 to 1 sessions, Unit Group sessions and Medication administration.  Evaluation of Outcomes: Not Met  Physician Treatment Plan for Secondary Diagnosis: Principal Problem:   Schizophrenia, paranoid type ([place])  Long Term Goal(s): Improvement in symptoms so as ready for discharge Improvement in symptoms so as ready for discharge   Short Term Goals: Ability to identify changes in lifestyle to reduce recurrence of condition will improve Ability to verbalize feelings will improve Ability to disclose and discuss suicidal ideas Ability to demonstrate self-control will improve Ability to identify and develop effective coping behaviors will improve     Medication Management: Evaluate patient's response, side effects, and tolerance of medication regimen.  Therapeutic Interventions: 1 to 1 sessions, Unit Group  sessions and Medication administration.  Evaluation of Outcomes: Not Met   RN Treatment Plan for Primary Diagnosis: Schizophrenia, paranoid type ([place]) Long Term Goal(s): Knowledge of disease and therapeutic regimen to maintain health will improve  Short Term Goals: Ability to verbalize feelings will improve, Ability to identify and develop effective coping behaviors will improve and Compliance with prescribed medications will  improve  Medication Management: RN will administer medications as ordered by provider, will assess and evaluate patient's response and provide education to patient for prescribed medication. RN will report any adverse and/or side effects to prescribing provider.  Therapeutic Interventions: 1 on 1 counseling sessions, Psychoeducation, Medication administration, Evaluate responses to treatment, Monitor vital signs and CBGs as ordered, Perform/monitor CIWA, COWS, AIMS and Fall Risk screenings as ordered, Perform wound care treatments as ordered.  Evaluation of Outcomes: Not Met   LCSW Treatment Plan for Primary Diagnosis: Schizophrenia, paranoid type ([place]) Long Term Goal(s): Safe transition to appropriate next level of care at discharge, Engage patient in therapeutic group addressing interpersonal concerns.  Short Term Goals: Engage patient in aftercare planning with referrals and resources, Increase ability to appropriately verbalize feelings, Facilitate patient progression through stages of change regarding substance use diagnoses and concerns, Identify triggers associated with mental health/substance abuse issues and Increase skills for wellness and recovery  Therapeutic Interventions: Assess for all discharge needs, 1 to 1 time with Social worker, Explore available resources and support systems, Assess for adequacy in community support network, Educate family and significant other(s) on suicide prevention, Complete Psychosocial Assessment, Interpersonal group therapy.  Evaluation of Outcomes: Not Met  Progress in Treatment: Attending groups: No. Participating in groups: No. Taking medication as prescribed: Yes. Toleration medication: Yes. Family/Significant other contact made: No, will contact:  declined consents. Patient understands diagnosis: No. Discussing patient identified problems/goals with staff: Yes. Medical problems stabilized or resolved: Yes. Denies suicidal/homicidal  ideation: Yes. Issues/concerns per patient self-inventory: Yes.  New problem(s) identified: Yes, Describe:  homelessness, limited supports.  New Short Term/Long Term Goal(s): detox, medication management for mood stabilization; elimination of SI thoughts; development of comprehensive mental wellness/sobriety plan.  Patient Goals:    Discharge Plan or Barriers: Provided shelter resources, will be referred for therapy and medication management.  Reason for Continuation of Hospitalization: Anxiety Delusions  Depression Medication stabilization Suicidal ideation Withdrawal symptoms  Estimated Length of Stay: 3-5 days  Attendees: Patient: 10/24/2019 10:58 AM  Physician:  10/24/2019 10:58 AM  Nursing:  10/24/2019 10:58 AM  RN Care Manager: 10/24/2019 10:58 AM  Social Worker: Stephanie Acre, [place] 10/24/2019 10:58 AM  Recreational Therapist:  10/24/2019 10:58 AM  Other:  10/24/2019 10:58 AM  Other:  10/24/2019 10:58 AM  Other: 10/24/2019 10:58 AM    Scribe for Treatment Team: Joellen Jersey, [place] 10/24/2019 10:58 AM

## 2019-10-24 NOTE — Progress Notes (Signed)
   10/24/19 2030  Psych Admission Type (Psych Patients Only)  Admission Status Involuntary  Psychosocial Assessment  Patient Complaints None  Eye Contact Fair  Facial Expression Sad;Flat  Affect Anxious  Speech Logical/coherent  Interaction Guarded;Minimal  Motor Activity Other (Comment) (WNL)  Appearance/Hygiene Poor hygiene  Behavior Characteristics Cooperative;Guarded  Mood Pleasant;Anxious  Thought Process  Coherency WDL  Content WDL  Delusions None reported or observed  Perception WDL  Hallucination None reported or observed  Judgment Poor  Confusion None  Danger to Self  Current suicidal ideation? Denies  Danger to Others  Danger to Others None reported or observed

## 2019-10-25 MED ORDER — HALOPERIDOL 5 MG PO TABS: 5.0000 mg | ORAL_TABLET | Freq: Every day | ORAL | 0 refills | Status: DC

## 2019-10-25 MED ORDER — BENZTROPINE MESYLATE 0.5 MG PO TABS: 0.5000 mg | ORAL_TABLET | Freq: Two times a day (BID) | ORAL | 0 refills | Status: DC

## 2019-10-25 MED ORDER — HALOPERIDOL 10 MG PO TABS: 10.0000 mg | ORAL_TABLET | Freq: Every day | ORAL | 0 refills | Status: DC

## 2019-10-25 NOTE — [facility] Suicide Risk Assessment (Signed)
[facility] Discharge Suicide Risk Assessment   Principal Problem: Schizophrenia, paranoid type [facility]) Discharge Diagnoses: Principal Problem:   Schizophrenia, paranoid type (HCC)   Total Time spent with patient: 15 minutes  Musculoskeletal: Strength & Muscle Tone: within normal limits Gait & Station: normal Patient leans: N/A  Psychiatric Specialty Exam: Review of Systems  Blood pressure (!) 158/133, pulse 82, temperature 98.1 F (36.7 C), temperature source Oral, height 5\' 10"  (1.778 m), weight 65.8 kg.Body mass index is 20.81 kg/m.  General Appearance: Casual  Eye Contact::  Fair  Speech:  Normal Rate409  Volume:  Normal  Mood:  Dysphoric  Affect:  Congruent  Thought Process:  Coherent and Descriptions of Associations: Circumstantial  Orientation:  Full (Time, Place, and Person)  Thought Content:  Delusions  Suicidal Thoughts:  No  Homicidal Thoughts:  No  Memory:  Immediate;   Fair Recent;   Fair Remote;   Fair  Judgement:  Intact  Insight:  Fair  Psychomotor Activity:  Normal  Concentration:  Fair  Recall:  [id number] of Knowledge:Fair  Language: Fair  Akathisia:  Negative  Handed:  Right  AIMS (if indicated):     Assets:  Desire for Improvement Resilience  Sleep:  Number of Hours: 5.75  Cognition: WNL  ADL's:  Intact   Mental Status Per Nursing Assessment::   On Admission:  NA  Demographic Factors:  Male, Low socioeconomic status, Living alone and Unemployed  Loss Factors: NA  Historical Factors: NA  Risk Reduction Factors:   NA  Continued Clinical Symptoms:  Schizophrenia:   Less than 17 years old Paranoid or undifferentiated type  Cognitive Features That Contribute To Risk:  None    Suicide Risk:  Minimal: No identifiable suicidal ideation.  Patients presenting with no risk factors but with morbid ruminations; may be classified as minimal risk based on the severity of the depressive symptoms  Follow-up Information    [facility] Follow up.   Specialty: Professional Counselor Why: Appointment needed Contact information: [facility] [address] [place] [phone]        [facility] Follow up.   Contact information: [address] [place] [phone]           Plan Of Care/Follow-up recommendations:  Activity:  ad lib  02-23-1991, MD 10/25/2019, 9:18 AM

## 2019-10-25 NOTE — Discharge Summary (Signed)
Physician Discharge Summary Note  Patient:  Jake Neal is an 29 y.o., male MRN:  [id number] DOB:  January 15, 1991 Patient phone:  [phone] (home)  Patient address:   [address] [place] [address],  Total Time spent with patient: 15 minutes  Date of Admission:  10/21/2019 Date of Discharge: 10/25/19  Reason for Admission:  psychosis  Principal Problem: Schizophrenia, paranoid type [facility]) Discharge Diagnoses: Principal Problem:   Schizophrenia, paranoid type (HCC)   Past Psychiatric History: History of schizophrenia with multiple prior hospitalizations. He was last admitted to [facility] for observation 09/17/19-09/18/19 and discharged on Zyprexa.   Past Medical History:  Past Medical History:  Diagnosis Date  . Medical history non-contributory    History reviewed. No pertinent surgical history. Family History: History reviewed. No pertinent family history. Family Psychiatric  History: Unknown Social History:  Social History   Substance and Sexual Activity  Alcohol Use No     Social History   Substance and Sexual Activity  Drug Use Yes  . Types: Marijuana    Social History   Socioeconomic History  . Marital status: Single    Spouse name: Not on file  . Number of children: Not on file  . Years of education: Not on file  . Highest education level: Not on file  Occupational History  . Not on file  Tobacco Use  . Smoking status: Former Games developer  . Smokeless tobacco: Never Used  Substance and Sexual Activity  . Alcohol use: No  . Drug use: Yes    Types: Marijuana  . Sexual activity: Yes    Birth control/protection: None  Other Topics Concern  . Not on file  Social History Narrative  . Not on file   Social Determinants of Health   Financial Resource Strain:   . Difficulty of Paying Living Expenses:   Food Insecurity:   . Worried About Programme researcher, broadcasting/film/video in the Last Year:   . Barista in the Last Year:   Transportation Needs:   . Automotive engineer (Medical):   Marland Kitchen Lack of Transportation (Non-Medical):   Physical Activity:   . Days of Exercise per Week:   . Minutes of Exercise per Session:   Stress:   . Feeling of Stress :   Social Connections:   . Frequency of Communication with Friends and Family:   . Frequency of Social Gatherings with Friends and Family:   . Attends Religious Services:   . Active Member of Clubs or Organizations:   . Attends Banker Meetings:   Marland Kitchen Marital Status:     Hospital Course:  From admission H&P: 29 year old male, no children, currently homeless, unemployed. Presented under IVC initiated by family member. IVC states ""Respondent is schizophrenic and does not take medication. Was last committed 2 months ago. Respondent says he will kill himself by walking into traffic. Respondent believes "agents" are after him and has been living homeless. Believe aunt who is IVC'ing him is dead. Has been missing for 2 weeks." At this time he presents limited historian.  He states he does not understand why he is in the hospital.  Reports "I am homeless, I am okay with being homeless, so nobody should need to worry about me".  Acknowledges he has "bad thoughts" but reiterates that he is homeless and that this is his choice and that he prefers to keep to himself .  States "I do not bother anybody".  At this time he does not elaborate  on what he means by that thoughts, but does deny any violent or homicidal ideations. He denies feeling depressed and currently does not endorse significant neurovegetative symptoms. Denies medical illnesses.  NKDA.  He reports he is not taking any psychiatric medications at this time. Denies alcohol or drug abuse.  Reports past history of cannabis use but states he has not used recently. Patient is known to [facility] from prior psychiatric admission in March 2020 and ED visits.  In March she had presented for mood instability, paranoia, aggressive behavior towards family members.  At  that time he was discharged on Prozac, p.o. haloperidol, IM Haldol decanoate, Minipress and trazodone.  Most recently he had been evaluated in ED on April 24/2021, at which time he presented for suicidal ideations and disorganized thought process. He was diagnosed with schizophrenia, PTSD and was discharged on Zyprexa 5 mg nightly.  Mr. Verdi was admitted under IVC from his aunt as reported above. He remained on the [facility] unit for four days. He was started on Haldol and Cogentin. He requested discharge throughout hospitalization and declined need for treatment. He has shown stable mood, affect, sleep, and interaction. He denies any SI/HI/AVH and contracts for safety. He is discharging on the medications listed below. He agrees to follow up at [facility] and PCP (See below). Patient is provided with prescriptions for medications upon discharge. He is provided with bus pass and shelter resources.  Physical Findings: AIMS: Facial and Oral Movements Muscles of Facial Expression: None, normal Lips and Perioral Area: None, normal Jaw: None, normal Tongue: None, normal,Extremity Movements Upper (arms, wrists, hands, fingers): None, normal Lower (legs, knees, ankles, toes): None, normal, Trunk Movements Neck, shoulders, hips: None, normal, Overall Severity Severity of abnormal movements (highest score from questions above): None, normal Incapacitation due to abnormal movements: None, normal Patient's awareness of abnormal movements (rate only patient's report): No Awareness, Dental Status Current problems with teeth and/or dentures?: No Does patient usually wear dentures?: No  CIWA:  CIWA-Ar Total: 0 COWS:     Musculoskeletal: Strength & Muscle Tone: within normal limits Gait & Station: normal Patient leans: N/A  Psychiatric Specialty Exam: Physical Exam  Nursing note and vitals reviewed. Constitutional: He is oriented to person, place, and time. He appears well-developed and  well-nourished.  Cardiovascular: Normal rate.  Respiratory: Effort normal.  Neurological: He is alert and oriented to person, place, and time.    Review of Systems  Constitutional: Negative.   Respiratory: Negative for cough and shortness of breath.   Psychiatric/Behavioral: Negative for agitation, behavioral problems, confusion, dysphoric mood, hallucinations, self-injury, sleep disturbance and suicidal ideas. The patient is not nervous/anxious and is not hyperactive.     Blood pressure (!) 158/133, pulse 82, temperature 98.1 F (36.7 C), temperature source Oral, height 5\' 10"  (1.778 m), weight 65.8 kg.Body mass index is 20.81 kg/m.  See MD's discharge SRA      Has this patient used any form of tobacco in the last 30 days? (Cigarettes, Smokeless Tobacco, Cigars, and/or Pipes)  No  Blood Alcohol level:  Lab Results  Component Value Date   [facility] <10 07/31/2018   ETH <5 07/04/2016    Metabolic Disorder Labs:  Lab Results  Component Value Date   HGBA1C 5.0 10/22/2019   MPG 96.8 10/22/2019   MPG 93.93 08/03/2018   Lab Results  Component Value Date   PROLACTIN 9.9 07/06/2016   PROLACTIN 14.0 07/04/2016   Lab Results  Component Value Date   CHOL  219 (H) 10/22/2019   TRIG 103 10/22/2019   HDL 61 10/22/2019   CHOLHDL 3.6 10/22/2019   VLDL 21 10/22/2019   LDLCALC 137 (H) 10/22/2019   LDLCALC 138 (H) 08/03/2018    See Psychiatric Specialty Exam and Suicide Risk Assessment completed by Attending Physician prior to discharge.  Discharge destination:  Home  Is patient on multiple antipsychotic therapies at discharge:  No   Has Patient had three or more failed trials of antipsychotic monotherapy by history:  No  Recommended Plan for Multiple Antipsychotic Therapies: NA  Discharge Instructions    Discharge instructions   Complete by: As directed    Patient is instructed to take all prescribed medications as recommended. Report any side effects or adverse reactions to your  outpatient psychiatrist. Patient is instructed to abstain from alcohol and illegal drugs while on prescription medications. In the event of worsening symptoms, patient is instructed to call the crisis hotline, 911, or go to the nearest emergency department for evaluation and treatment.     Allergies as of 10/25/2019   No Known Allergies     Medication List    STOP taking these medications   OLANZapine 5 MG tablet Commonly known as: ZYPREXA     TAKE these medications     Indication  benztropine 0.5 MG tablet Commonly known as: COGENTIN Take 1 tablet (0.5 mg total) by mouth 2 (two) times daily.  Indication: Extrapyramidal Reaction caused by Medications   haloperidol 10 MG tablet Commonly known as: HALDOL Take 1 tablet (10 mg total) by mouth at bedtime.  Indication: Psychosis   haloperidol 5 MG tablet Commonly known as: HALDOL Take 1 tablet (5 mg total) by mouth daily. Start taking on: October 26, 2019  Indication: Psychosis      Follow-up [place] to.   Specialty: Professional Counselor Why: Please go to this provider to establish for services during their walk in hours.  Walk in hours are 8:30 am to 12:00 pm and 1:00 pm to 2:30 pm, Monday through Friday.  Contact information: [facility] [place] [address] [phone]        [facility]. Call.   Why: Please follow up witht his provider for services. Contact information: [place] [address] [phone]           Follow-up recommendations: Activity as tolerated. Diet as recommended by primary care physician. Keep all scheduled follow-up appointments as recommended.   Comments:   Patient is instructed to take all prescribed medications as recommended. Report any side effects or adverse reactions to your outpatient psychiatrist. Patient is instructed to abstain from alcohol  and illegal drugs while on prescription medications. In the event of worsening symptoms, patient is instructed to call the crisis hotline, 911, or go to the nearest emergency department for evaluation and treatment.  Signed: Connye Burkitt, NP 10/25/2019, 1:09 PM

## 2019-10-25 NOTE — Progress Notes (Signed)
Pt discharged to lobby. Pt was stable and appreciative at that time. All papers, samples and prescriptions were given and valuables returned. Verbal understanding expressed. Denies SI/HI and A/VH. Pt given opportunity to express concerns and ask questions.  

## 2019-10-25 NOTE — Progress Notes (Signed)
  [facility] Adult Case Management Discharge Plan :  Will you be returning to the same living situation after discharge:  No. Offered shelter resources.  At discharge, do you have transportation home?: No. Provided with bus pass, declined Psychologist, educational.  Do you have the ability to pay for your medications: No. Will be discharged with samples.  Release of information consent forms completed and in the chart;  Patient's signature needed at discharge.  Patient to Follow up at: Follow-up Information    [facility] Follow up.   Specialty: Professional Counselor Why: Appointment needed Contact information: [facility] [address] [place] [address] [phone]        [facility] Follow up.   Contact information: [address] [place] [address] [phone]           Next level of care provider has access to [facility] Link:no  Safety Planning and Suicide Prevention discussed: Yes,  with patient.     Has patient been referred to the Quitline?: Patient refused referral  Patient has been referred for addiction treatment: Pt. refused referral  Otelia Santee, LCSWA 10/25/2019, 9:06 AM

## 2019-12-08 ENCOUNTER — Encounter (HOSPITAL_COMMUNITY): Payer: Self-pay | Admitting: *Deleted

## 2019-12-08 ENCOUNTER — Other Ambulatory Visit: Payer: Self-pay

## 2019-12-08 ENCOUNTER — Emergency Department (HOSPITAL_COMMUNITY)
Admission: EM | Admit: 2019-12-08 | Discharge: 2019-12-09 | Disposition: A | Discharge status: Other Acute Inpatient Hospital | Payer: Medicaid Other | Attending: Emergency Medicine | Admitting: Emergency Medicine

## 2019-12-08 DIAGNOSIS — Z87891 Personal history of nicotine dependence: Secondary | ICD-10-CM | POA: Insufficient documentation

## 2019-12-08 DIAGNOSIS — Z008 Encounter for other general examination: Secondary | ICD-10-CM

## 2019-12-08 DIAGNOSIS — Z20822 Contact with and (suspected) exposure to covid-19: Secondary | ICD-10-CM | POA: Insufficient documentation

## 2019-12-08 DIAGNOSIS — Z046 Encounter for general psychiatric examination, requested by authority: Secondary | ICD-10-CM | POA: Insufficient documentation

## 2019-12-08 DIAGNOSIS — F200 Paranoid schizophrenia: Secondary | ICD-10-CM

## 2019-12-08 LAB — ACETAMINOPHEN LEVEL: Acetaminophen (Tylenol), Serum: <10 ug/mL — ABNORMAL LOW (ref 10–30)

## 2019-12-08 LAB — COMPREHENSIVE METABOLIC PANEL
ALT: 17 U/L (ref 0–44)
AST: 17 U/L (ref 15–41)
Albumin: 4.4 g/dL (ref 3.5–5.0)
Alkaline Phosphatase: 53 U/L (ref 38–126)
Anion gap: 10 (ref 5–15)
BUN: 11 mg/dL (ref 6–20)
CO2: 26 mmol/L (ref 22–32)
Calcium: 9.9 mg/dL (ref 8.9–10.3)
Chloride: 105 mmol/L (ref 98–111)
Creatinine, Ser: 1.08 mg/dL (ref 0.61–1.24)
GFR calc Af Amer: >60 mL/min (ref >60)
GFR calc non Af Amer: >60 mL/min (ref >60)
Glucose, Bld: 87 mg/dL (ref 70–99)
Potassium: 4.3 mmol/L (ref 3.5–5.1)
Sodium: 141 mmol/L (ref 135–145)
Total Bilirubin: 1.5 mg/dL — ABNORMAL HIGH (ref 0.3–1.2)
Total Protein: 7.3 g/dL (ref 6.5–8.1)

## 2019-12-08 LAB — RAPID URINE DRUG SCREEN, HOSP PERFORMED
Amphetamines: NOT DETECTED
Barbiturates: NOT DETECTED
Benzodiazepines: NOT DETECTED
Cocaine: NOT DETECTED
Opiates: NOT DETECTED
Tetrahydrocannabinol: NOT DETECTED

## 2019-12-08 LAB — ETHANOL: Alcohol, Ethyl (B): <10 mg/dL (ref <10)

## 2019-12-08 LAB — CBC
HCT: 44.4 % (ref 39.0–52.0)
Hemoglobin: 14.8 g/dL (ref 13.0–17.0)
MCH: 31.8 pg (ref 26.0–34.0)
MCHC: 33.3 g/dL (ref 30.0–36.0)
MCV: 95.5 fL (ref 80.0–100.0)
Platelets: UNDETERMINED 10*3/uL (ref 150–400)
RBC: 4.65 MIL/uL (ref 4.22–5.81)
RDW: 12.3 % (ref 11.5–15.5)
WBC: 3.2 10*3/uL — ABNORMAL LOW (ref 4.0–10.5)
nRBC: 0 % (ref 0.0–0.2)

## 2019-12-08 LAB — SALICYLATE LEVEL: Salicylate Lvl: <7 mg/dL — ABNORMAL LOW (ref 7.0–30.0)

## 2019-12-08 LAB — SARS CORONAVIRUS 2 BY RT PCR (HOSPITAL ORDER, PERFORMED IN ~~LOC~~ HOSPITAL LAB): SARS Coronavirus 2: NEGATIVE

## 2019-12-08 NOTE — ED Notes (Signed)
Pt in wine colored scrubs
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
## 2019-12-08 NOTE — ED Provider Notes (Signed)
Patient received in sign out pending [name] evaluation to determine disposition.  He is under IVC, first exam filled out by previous team. Please see previous provider note.   Patient is medically clear. Will sign out to provider default as [name] still pending at end of my shift. Patient has sitter at the bedside.   Kathyrn Lass 12/08/19 2054    Benjiman Core, MD 12/09/19 [phone]

## 2019-12-08 NOTE — BH Assessment (Addendum)
Pt aunt [name], [phone]) asked to be contacted when the pt is discharged, so she can pick him up.    Redmond Pulling, MS, [facility], [facility] Triage Specialist [phone]

## 2019-12-08 NOTE — ED Provider Notes (Signed)
[facility] EMERGENCY DEPARTMENT Provider Note   CSN: [id number] Arrival date & time: 12/08/19  1146     History Chief Complaint  Patient presents with  . Medical Clearance    Jake Neal is a 29 y.o. male.  Pt reports his Aunt had police bring him here because he talks to himself.  Pt reports he has always talked to himself.  Pt denies thoughts of self harm Pt states he is off his mental health medications   The history is provided by the patient. No language interpreter was used.       Past Medical History:  Diagnosis Date  . Medical history non-contributory     Patient Active Problem List   Diagnosis Date Noted  . Schizophrenia, paranoid type (HCC) 02/04/2019  . Chronic post-traumatic stress disorder (PTSD) 01/28/2019    History reviewed. No pertinent surgical history.     No family history on file.  Social History   Tobacco Use  . Smoking status: Former Games developer  . Smokeless tobacco: Never Used  Substance Use Topics  . Alcohol use: No  . Drug use: Yes    Types: Marijuana    Home Medications Prior to Admission medications   Medication Sig Start Date End Date Taking? Authorizing Provider  benztropine (COGENTIN) 0.5 MG tablet Take 1 tablet (0.5 mg total) by mouth 2 (two) times daily. 10/25/19   Aldean Baker, NP  haloperidol (HALDOL) 10 MG tablet Take 1 tablet (10 mg total) by mouth at bedtime. 10/25/19   Aldean Baker, NP  haloperidol (HALDOL) 5 MG tablet Take 1 tablet (5 mg total) by mouth daily. 10/26/19   Aldean Baker, NP    Allergies    Patient has no known allergies.  Review of Systems   Review of Systems  All other systems reviewed and are negative.   Physical Exam Updated Vital Signs BP 100/68 (BP Location: Right Arm)   Pulse 84   Temp 99.7 F (37.6 C) (Oral)   Resp 14   Ht 6' (1.829 m)   Wt 65.8 kg   SpO2 96%   BMI 19.67 kg/m   Physical Exam Vitals and nursing note reviewed.  Constitutional:      Appearance:  He is well-developed.  HENT:     Head: Normocephalic.  Cardiovascular:     Rate and Rhythm: Normal rate.  Pulmonary:     Effort: Pulmonary effort is normal.  Abdominal:     General: There is no distension.  Musculoskeletal:        General: Normal range of motion.     Cervical back: Normal range of motion.  Neurological:     Mental Status: He is alert and oriented to person, place, and time.  Psychiatric:        Mood and Affect: Mood normal.     ED Results / Procedures / Treatments   Labs (all labs ordered are listed, but only abnormal results are displayed) Labs Reviewed  RAPID URINE DRUG SCREEN, HOSP PERFORMED  COMPREHENSIVE METABOLIC PANEL  ETHANOL  SALICYLATE LEVEL  ACETAMINOPHEN LEVEL  CBC    EKG None  Radiology No results found.  Procedures Procedures (including critical care time)  Medications Ordered in ED Medications - No data to display  ED Course  I have reviewed the triage vital signs and the nursing notes.  Pertinent labs & imaging results that were available during my care of the patient were reviewed by me and considered in my medical  decision making (see chart for details).    MDM Rules/Calculators/A&P                          MDM:  Pt has ivc papers from police.  Labs pending.  I will consult [name] Final Clinical Impression(s) / ED Diagnoses Final diagnoses:  Medical clearance for psychiatric admission    Rx / DC Orders ED Discharge Orders    None       Osie Cheeks 12/10/19 1833    Eber Hong, MD 12/11/19 [phone]

## 2019-12-08 NOTE — ED Triage Notes (Signed)
Patient presents to ed [facility] [employer] in cuffs states Patient is IVC'd. Patient denies any problems. Denies HI/SI . States his aunt called on him , however he is homeless

## 2019-12-09 ENCOUNTER — Inpatient Hospital Stay (HOSPITAL_COMMUNITY)
Admission: AD | Admit: 2019-12-09 | Discharge: 2019-12-12 | DRG: 885 | Disposition: A | Discharge status: Home / Self Care | Payer: Medicaid Other | Source: Intra-hospital | Attending: Psychiatry | Admitting: Psychiatry

## 2019-12-09 ENCOUNTER — Encounter (HOSPITAL_COMMUNITY): Payer: Self-pay | Admitting: Psychiatry

## 2019-12-09 ENCOUNTER — Other Ambulatory Visit: Payer: Self-pay

## 2019-12-09 DIAGNOSIS — Z20822 Contact with and (suspected) exposure to covid-19: Secondary | ICD-10-CM | POA: Diagnosis present

## 2019-12-09 DIAGNOSIS — F419 Anxiety disorder, unspecified: Secondary | ICD-10-CM | POA: Diagnosis present

## 2019-12-09 DIAGNOSIS — F29 Unspecified psychosis not due to a substance or known physiological condition: Secondary | ICD-10-CM | POA: Diagnosis present

## 2019-12-09 DIAGNOSIS — G4700 Insomnia, unspecified: Secondary | ICD-10-CM | POA: Diagnosis present

## 2019-12-09 DIAGNOSIS — Z79899 Other long term (current) drug therapy: Secondary | ICD-10-CM | POA: Diagnosis not present

## 2019-12-09 DIAGNOSIS — F200 Paranoid schizophrenia: Principal | ICD-10-CM | POA: Diagnosis present

## 2019-12-09 DIAGNOSIS — Z87891 Personal history of nicotine dependence: Secondary | ICD-10-CM

## 2019-12-09 DIAGNOSIS — F4310 Post-traumatic stress disorder, unspecified: Secondary | ICD-10-CM | POA: Diagnosis present

## 2019-12-09 HISTORY — DX: Schizophrenia, unspecified: F20.9

## 2019-12-09 HISTORY — DX: Anxiety disorder, unspecified: F41.9

## 2019-12-09 HISTORY — DX: Depression, unspecified: F32.A

## 2019-12-09 MED ORDER — ACETAMINOPHEN 325 MG PO TABS: 650.0000 mg | ORAL_TABLET | Freq: Four times a day (QID) | ORAL | Status: DC | PRN

## 2019-12-09 MED ORDER — LORAZEPAM 1 MG PO TABS: 1.0000 mg | ORAL_TABLET | ORAL | Status: DC | PRN

## 2019-12-09 MED ORDER — MAGNESIUM HYDROXIDE 400 MG/5ML PO SUSP: 30.0000 mL | Freq: Every day | ORAL | Status: DC | PRN

## 2019-12-09 MED ORDER — ALUM & MAG HYDROXIDE-SIMETH 200-200-20 MG/5ML PO SUSP: 30.0000 mL | ORAL | Status: DC | PRN

## 2019-12-09 MED ORDER — OLANZAPINE 10 MG PO TBDP: 10.0000 mg | ORAL_TABLET | Freq: Three times a day (TID) | ORAL | Status: DC | PRN

## 2019-12-09 MED ORDER — HALOPERIDOL 5 MG PO TABS
5.0000 mg | ORAL_TABLET | Freq: Every day | ORAL | Status: DC
  Administered 2019-12-09 – 2019-12-12 (×4): 5 mg via ORAL
  Filled 2019-12-09 (×5): qty 1

## 2019-12-09 MED ORDER — TRAZODONE HCL 100 MG PO TABS
100.0000 mg | ORAL_TABLET | Freq: Every evening | ORAL | Status: DC | PRN
  Filled 2019-12-09: qty 1
  Filled 2019-12-09: qty 7

## 2019-12-09 MED ORDER — HALOPERIDOL 5 MG PO TABS
10.0000 mg | ORAL_TABLET | Freq: Every day | ORAL | Status: DC
  Administered 2019-12-09 – 2019-12-11 (×3): 10 mg via ORAL
  Filled 2019-12-09 (×4): qty 2

## 2019-12-09 MED ORDER — HALOPERIDOL LACTATE 5 MG/ML IJ SOLN
10.0000 mg | Freq: Every day | INTRAMUSCULAR | Status: DC
  Filled 2019-12-09 (×4): qty 2

## 2019-12-09 MED ORDER — ZIPRASIDONE MESYLATE 20 MG IM SOLR: 20.0000 mg | INTRAMUSCULAR | Status: DC | PRN

## 2019-12-09 MED ORDER — HYDROXYZINE HCL 50 MG PO TABS
50.0000 mg | ORAL_TABLET | Freq: Three times a day (TID) | ORAL | Status: DC | PRN
  Filled 2019-12-09: qty 10

## 2019-12-09 MED ORDER — HALOPERIDOL LACTATE 5 MG/ML IJ SOLN
5.0000 mg | Freq: Every day | INTRAMUSCULAR | Status: DC
  Filled 2019-12-09 (×5): qty 1

## 2019-12-09 MED ORDER — BENZTROPINE MESYLATE 0.5 MG PO TABS
0.5000 mg | ORAL_TABLET | Freq: Two times a day (BID) | ORAL | Status: DC
  Administered 2019-12-09 – 2019-12-12 (×7): 0.5 mg via ORAL
  Filled 2019-12-09 (×9): qty 1

## 2019-12-09 NOTE — H&P (Signed)
Psychiatric Admission Assessment Adult  Patient Identification: Jake LangeJaquan A Neal  MRN:  [id number]  Date of Evaluation:  12/09/2019  Chief Complaint: Worsening psychosis & threats to kill himself  Principal Diagnosis: Schizophrenia, paranoid type (HCC)  Diagnosis:  Principal Problem:   Schizophrenia, paranoid type (HCC) Active Problems:   Psychosis (HCC)  History of Present Illness: (Per Md's admission SRA notes): Patient is seen and examined.  Patient is a 29 year old male with a known past psychiatric history significant for schizophrenia who presented to the [facility] emergency department on 12/08/2019.  He had been placed under involuntary commitment.  Reportedly the patient was responding to internal stimuli, having auditory hallucinations, talking out loud, and having anger episodes.  The patient had recently been discharged from our facility on 10/25/2019.  His diagnosis at that time was schizophrenia.  His family also involuntarily committed him at that time.  He has also previously been diagnosed with posttraumatic stress disorder.  During the course of that hospitalization he was placed on haloperidol.  He had also previously been treated with Zyprexa.  Review of the electronic medical record revealed that he had also been previously treated with a long-acting Abilify injection.  On his last hospitalization he refused injectable medications.  This morning he is a bit irritable, appears to be responding to internal stimuli, and requesting discharge.  He was placed on involuntary commitment this morning.  He was admitted to the hospital for evaluation and stabilization.  Associated Signs/Symptoms:  Depression Symptoms:  "I ain't depressed"  (Hypo) Manic Symptoms:  Labiality of Mood,  Anxiety Symptoms:  Excessive Worry,  Psychotic Symptoms:  Delusions, Hallucinations: Auditory Paranoia,  PTSD Symptoms: NA  Total Time spent with patient: 1 hour  Past Psychiatric History: History  of schizophrenia with [facility] H   Is the patient at risk to self? No. (Patient currently denies) Has the patient been a risk to self in the past 6 months? Yes.    Has the patient been a risk to self within the distant past? Yes.    Is the patient a risk to others? No.  Has the patient been a risk to others in the past 6 months? No.  Has the patient been a risk to others within the distant past? No.   Prior Inpatient Therapy: Yes, [facility] x multiple times. Prior Outpatient Therapy: Yes.  Alcohol Screening: 1. How often do you have a drink containing alcohol?: Never 2. How many drinks containing alcohol do you have on a typical day when you are drinking?: 1 or 2 3. How often do you have six or more drinks on one occasion?: Never AUDIT-C Score: 0 4. How often during the last year have you found that you were not able to stop drinking once you had started?: Never 5. How often during the last year have you failed to do what was normally expected from you because of drinking?: Never 6. How often during the last year have you needed a first drink in the morning to get yourself going after a heavy drinking session?: Never 7. How often during the last year have you had a feeling of guilt of remorse after drinking?: Never 8. How often during the last year have you been unable to remember what happened the night before because you had been drinking?: Never 9. Have you or someone else been injured as a result of your drinking?: No 10. Has a relative or friend or a doctor or another Medical laboratory scientific officerhealth worker  been concerned about your drinking or suggested you cut down?: No Alcohol Use Disorder Identification Test Final Score (AUDIT): 0 Alcohol Brief Interventions/Follow-up: Continued Monitoring  Substance Abuse History in the last 12 months:  No.  Consequences of Substance Abuse: NA  Previous Psychotropic Medications: Yes   Psychological Evaluations: No   Past Medical  History:  Past Medical History:  Diagnosis Date  . Anxiety   . Depression   . Medical history non-contributory   . Schizophrenia (HCC)    History reviewed. No pertinent surgical history.  Family History: History reviewed. No pertinent family history.  Family Psychiatric  History: Unknown  Tobacco Screening: Have you used any form of tobacco in the last 30 days? (Cigarettes, Smokeless Tobacco, Cigars, and/or Pipes): No  Social History:  Social History   Substance and Sexual Activity  Alcohol Use No     Social History   Substance and Sexual Activity  Drug Use Yes  . Types: Marijuana   Comment: Currently denying substance use    Additional Social History:  Allergies:  No Known Allergies  Lab Results:  No results found for this or any previous visit (from the past 48 hour(s)).  Blood Alcohol level:  Lab Results  Component Value Date   ETH <10 12/08/2019   ETH <10 07/31/2018   Metabolic Disorder Labs:  Lab Results  Component Value Date   HGBA1C 5.0 10/22/2019   MPG 96.8 10/22/2019   MPG 93.93 08/03/2018   Lab Results  Component Value Date   PROLACTIN 9.9 07/06/2016   PROLACTIN 14.0 07/04/2016   Lab Results  Component Value Date   CHOL 219 (H) 10/22/2019   TRIG 103 10/22/2019   HDL 61 10/22/2019   CHOLHDL 3.6 10/22/2019   VLDL 21 10/22/2019   LDLCALC 137 (H) 10/22/2019   LDLCALC 138 (H) 08/03/2018   Current Medications: Current Facility-Administered Medications  Medication Dose Route Frequency Provider Last Rate Last Admin  . acetaminophen (TYLENOL) tablet 650 mg  650 mg Oral Q6H PRN Antonieta Pert, MD      . alum & mag hydroxide-simeth (MAALOX/MYLANTA) 200-200-20 MG/5ML suspension 30 mL  30 mL Oral Q4H PRN Antonieta Pert, MD      . benztropine (COGENTIN) tablet 0.5 mg  0.5 mg Oral BID Antonieta Pert, MD   0.5 mg at 12/09/19 6712  . haloperidol (HALDOL) tablet 10 mg  10 mg Oral QHS Antonieta Pert, MD       Or  . haloperidol lactate  (HALDOL) injection 10 mg  10 mg Intramuscular QHS Antonieta Pert, MD      . haloperidol (HALDOL) tablet 5 mg  5 mg Oral Daily Antonieta Pert, MD   5 mg at 12/09/19 4580   Or  . haloperidol lactate (HALDOL) injection 5 mg  5 mg Intramuscular Daily Antonieta Pert, MD      . hydrOXYzine (ATARAX/VISTARIL) tablet 50 mg  50 mg Oral TID PRN Antonieta Pert, MD      . OLANZapine zydis (ZYPREXA) disintegrating tablet 10 mg  10 mg Oral Q8H PRN Armandina Stammer I, NP       And  . LORazepam (ATIVAN) tablet 1 mg  1 mg Oral PRN Armandina Stammer I, NP       And  . ziprasidone (GEODON) injection 20 mg  20 mg Intramuscular PRN Aarav Burgett I, NP      . magnesium hydroxide (MILK OF MAGNESIA) suspension 30 mL  30 mL Oral Daily PRN Clary,  Marlane Mingle, MD      . traZODone (DESYREL) tablet 100 mg  100 mg Oral QHS PRN Antonieta Pert, MD       PTA Medications: Medications Prior to Admission  Medication Sig Dispense Refill Last Dose  . benztropine (COGENTIN) 0.5 MG tablet Take 1 tablet (0.5 mg total) by mouth 2 (two) times daily. 60 tablet 0   . haloperidol (HALDOL) 10 MG tablet Take 1 tablet (10 mg total) by mouth at bedtime. 30 tablet 0   . haloperidol (HALDOL) 5 MG tablet Take 1 tablet (5 mg total) by mouth daily. 30 tablet 0    Musculoskeletal: Strength & Muscle Tone: within normal limits Gait & Station: normal Patient leans: N/A  Psychiatric Specialty Exam: Physical Exam Vitals and nursing note reviewed.  Constitutional:      Appearance: He is well-developed.  HENT:     Head: Normocephalic.     Nose: Nose normal.     Mouth/Throat:     Pharynx: Oropharynx is clear.  Eyes:     Pupils: Pupils are equal, round, and reactive to light.  Cardiovascular:     Rate and Rhythm: Normal rate.     Pulses: Normal pulses.  Pulmonary:     Effort: Pulmonary effort is normal.  Genitourinary:    Comments: Deferred Musculoskeletal:        General: Normal range of motion.     Cervical back: Normal  range of motion.  Skin:    General: Skin is warm and dry.  Neurological:     Mental Status: He is alert and oriented to person, place, and time.     Review of Systems  Constitutional: Negative.  Negative for chills, diaphoresis and fever.  HENT: Negative for congestion, sneezing and sore throat.   Eyes: Negative for discharge.  Respiratory: Negative for cough, chest tightness, shortness of breath and wheezing.   Cardiovascular: Negative for chest pain and palpitations.  Gastrointestinal: Negative for diarrhea, nausea and vomiting.  Endocrine: Negative for cold intolerance.  Genitourinary: Negative for difficulty urinating.  Musculoskeletal: Negative for arthralgias and myalgias.  Allergic/Immunologic: Negative for environmental allergies and food allergies.       Allergies: NKDA  Neurological: Negative for dizziness, tremors, seizures, syncope, light-headedness and headaches.  Psychiatric/Behavioral: Positive for hallucinations (Hx. Ongoing Psychosis) and sleep disturbance. Negative for agitation, behavioral problems, confusion, decreased concentration, dysphoric mood, self-injury and suicidal ideas. The patient is not nervous/anxious and is not hyperactive.     Blood pressure 117/70, pulse 90, temperature 97.9 F (36.6 C), temperature source Oral, resp. rate 16, height 5\' 10"  (1.778 m), weight 67.1 kg, SpO2 98 %.Body mass index is 21.24 kg/m.  General Appearance: Disheveled  Eye Contact:  Fair  Speech:  Normal Rate  Volume:  Decreased  Mood:  Dysphoric  Affect:  Labile  Thought Process:  Descriptions of Associations: Loose  Orientation:  Other:  Oriented to safe & place.  Thought Content:  Delusions, Hallucinations: Auditory and Paranoid Ideation  Suicidal Thoughts:  Denies any thoughts, plans or intent  Homicidal Thoughts:  Denies  Memory:  Immediate;   Fair Recent;   Poor Remote;   Poor  Judgement:  Impaired  Insight:  Lacking  Psychomotor Activity:  Increased   Concentration:  Concentration: Fair and Attention Span: Fair  Recall:  of Knowledge:  Fair  Language:  Good  Akathisia:  Negative  Handed:  Right  AIMS (if indicated):     Assets:  Desire for Improvement Resilience Social  Support  ADL's:  Intact  Cognition:  WNL  Sleep:  Number of Hours: 1.5   Treatment Plan Summary: Daily contact with patient to assess and evaluate symptoms and progress in treatment and Medication management.   Treatment Plan/Recommendations:  1. Admit for crisis management and stabilization, estimated length of stay 3-5 days.    2. Medication management to reduce current symptoms to base line and improve the patient's overall level of functioning: See MAR, Md's SRA & treatment plan.   Patient recommended for inpatient hospitalization  Patient will participate in the therapeutic group milieu.  Discharge disposition started & in progress.   Observation Level/Precautions:  15 minute checks  Laboratory: Per ED  Psychotherapy: Group therapy/milieu  Medications: See [facility]  Consultations: as needed.  Discharge Concerns: Safety, mood stabilization  Estimated LOS: 5-7 days.  Other: Admit to the 500-hall     Physician Treatment Plan for Primary Diagnosis: Schizophrenia, paranoid type (HCC)  Long Term Goal(s): Improvement in symptoms so as ready for discharge  Short Term Goals: Ability to identify changes in lifestyle to reduce recurrence of condition will improve, Ability to verbalize feelings will improve and Ability to disclose and discuss suicidal ideas  Physician Treatment Plan for Secondary Diagnosis: Principal Problem:   Schizophrenia, paranoid type (HCC) Active Problems:   Psychosis (HCC)  Long Term Goal(s): Improvement in symptoms so as ready for discharge  Short Term Goals: Ability to demonstrate self-control will improve, Ability to identify and develop effective coping behaviors will improve, Compliance with prescribed medications will  improve and Ability to identify triggers associated with substance abuse/mental health issues will improve  I certify that inpatient services furnished can reasonably be expected to improve the patient's condition.    Armandina Stammer, NP, PMHNP, FNP-BC 7/16/202110:03 AM

## 2019-12-09 NOTE — Tx Team (Signed)
Initial Treatment Plan 12/09/2019 6:44 AM Jake Neal HOZ:[id number]    PATIENT STRESSORS: Financial difficulties Medication change or noncompliance Traumatic event   PATIENT STRENGTHS: Ability for insight Communication skills Physical Health   PATIENT IDENTIFIED PROBLEMS: Disturbed thought process  Homelessness  unemployment  History of trauma  Self care deficit             DISCHARGE CRITERIA:  Adequate post-discharge living arrangements Improved stabilization in mood, thinking, and/or behavior Verbal commitment to aftercare and medication compliance  PRELIMINARY DISCHARGE PLAN: Outpatient therapy Placement in alternative living arrangements  PATIENT/FAMILY INVOLVEMENT: This treatment plan has been presented to and reviewed with the patient, Jake Neal.  The patient has been given the opportunity to ask questions and make suggestions.  Olin Pia, RN 12/09/2019, 6:44 AM

## 2019-12-09 NOTE — Tx Team (Signed)
Interdisciplinary Treatment and Diagnostic Plan Update  12/09/2019 Time of Session: 9:55am Jake Neal MRN: [id number]  Principal Diagnosis: Schizophrenia, paranoid type [facility])  Secondary Diagnoses: Principal Problem:   Schizophrenia, paranoid type ([place]) Active Problems:   Psychosis ([place])   Current Medications:  Current Facility-Administered Medications  Medication Dose Route Frequency Provider Last Rate Last Admin  . acetaminophen (TYLENOL) tablet 650 mg  650 mg Oral Q6H PRN Sharma Covert, MD      . alum & mag hydroxide-simeth (MAALOX/MYLANTA) 200-200-20 MG/5ML suspension 30 mL  30 mL Oral Q4H PRN Sharma Covert, MD      . benztropine (COGENTIN) tablet 0.5 mg  0.5 mg Oral BID Sharma Covert, MD   0.5 mg at 12/09/19 1950  . haloperidol (HALDOL) tablet 10 mg  10 mg Oral QHS Sharma Covert, MD       Or  . haloperidol lactate (HALDOL) injection 10 mg  10 mg Intramuscular QHS Sharma Covert, MD      . haloperidol (HALDOL) tablet 5 mg  5 mg Oral Daily Sharma Covert, MD   5 mg at 12/09/19 9326   Or  . haloperidol lactate (HALDOL) injection 5 mg  5 mg Intramuscular Daily Sharma Covert, MD      . hydrOXYzine (ATARAX/VISTARIL) tablet 50 mg  50 mg Oral TID PRN Sharma Covert, MD      . OLANZapine zydis (ZYPREXA) disintegrating tablet 10 mg  10 mg Oral Q8H PRN Lindell Spar I, NP       And  . LORazepam (ATIVAN) tablet 1 mg  1 mg Oral PRN Lindell Spar I, NP       And  . ziprasidone (GEODON) injection 20 mg  20 mg Intramuscular PRN Nwoko, Agnes I, NP      . magnesium hydroxide (MILK OF MAGNESIA) suspension 30 mL  30 mL Oral Daily PRN Sharma Covert, MD      . traZODone (DESYREL) tablet 100 mg  100 mg Oral QHS PRN Sharma Covert, MD       PTA Medications: Medications Prior to Admission  Medication Sig Dispense Refill Last Dose  . benztropine (COGENTIN) 0.5 MG tablet Take 1 tablet (0.5 mg total) by mouth 2 (two) times daily. 60 tablet 0   .  haloperidol (HALDOL) 10 MG tablet Take 1 tablet (10 mg total) by mouth at bedtime. 30 tablet 0   . haloperidol (HALDOL) 5 MG tablet Take 1 tablet (5 mg total) by mouth daily. 30 tablet 0     Patient Stressors: Financial difficulties Medication change or noncompliance Traumatic event  Patient Strengths: Ability for insight Communication skills Physical Health  Treatment Modalities: Medication Management, Group therapy, Case management,  1 to 1 session with clinician, Psychoeducation, Recreational therapy.   Physician Treatment Plan for Primary Diagnosis: Schizophrenia, paranoid type ([place]) Long Term Goal(s): Improvement in symptoms so as ready for discharge Improvement in symptoms so as ready for discharge   Short Term Goals: Ability to identify changes in lifestyle to reduce recurrence of condition will improve Ability to verbalize feelings will improve Ability to disclose and discuss suicidal ideas Ability to demonstrate self-control will improve Ability to identify and develop effective coping behaviors will improve Compliance with prescribed medications will improve Ability to identify triggers associated with substance abuse/mental health issues will improve  Medication Management: Evaluate patient's response, side effects, and tolerance of medication regimen.  Therapeutic Interventions: 1 to 1 sessions, Unit Group sessions and Medication administration.  Evaluation of Outcomes: Not Met  Physician Treatment Plan for Secondary Diagnosis: Principal Problem:   Schizophrenia, paranoid type ([place]) Active Problems:   Psychosis ([place])  Long Term Goal(s): Improvement in symptoms so as ready for discharge Improvement in symptoms so as ready for discharge   Short Term Goals: Ability to identify changes in lifestyle to reduce recurrence of condition will improve Ability to verbalize feelings will improve Ability to disclose and discuss suicidal ideas Ability to demonstrate  self-control will improve Ability to identify and develop effective coping behaviors will improve Compliance with prescribed medications will improve Ability to identify triggers associated with substance abuse/mental health issues will improve     Medication Management: Evaluate patient's response, side effects, and tolerance of medication regimen.  Therapeutic Interventions: 1 to 1 sessions, Unit Group sessions and Medication administration.  Evaluation of Outcomes: Not Met   RN Treatment Plan for Primary Diagnosis: Schizophrenia, paranoid type ([place]) Long Term Goal(s): Knowledge of disease and therapeutic regimen to maintain health will improve  Short Term Goals: Ability to remain free from injury will improve, Ability to verbalize frustration and anger appropriately will improve, Ability to identify and develop effective coping behaviors will improve and Compliance with prescribed medications will improve  Medication Management: RN will administer medications as ordered by provider, will assess and evaluate patient's response and provide education to patient for prescribed medication. RN will report any adverse and/or side effects to prescribing provider.  Therapeutic Interventions: 1 on 1 counseling sessions, Psychoeducation, Medication administration, Evaluate responses to treatment, Monitor vital signs and CBGs as ordered, Perform/monitor CIWA, COWS, AIMS and Fall Risk screenings as ordered, Perform wound care treatments as ordered.  Evaluation of Outcomes: Not Met   LCSW Treatment Plan for Primary Diagnosis: Schizophrenia, paranoid type ([place]) Long Term Goal(s): Safe transition to appropriate next level of care at discharge, Engage patient in therapeutic group addressing interpersonal concerns.  Short Term Goals: Engage patient in aftercare planning with referrals and resources, Increase social support, Facilitate acceptance of mental health diagnosis and concerns, Identify triggers  associated with mental health/substance abuse issues and Increase skills for wellness and recovery  Therapeutic Interventions: Assess for all discharge needs, 1 to 1 time with Social worker, Explore available resources and support systems, Assess for adequacy in community support network, Educate family and significant other(s) on suicide prevention, Complete Psychosocial Assessment, Interpersonal group therapy.  Evaluation of Outcomes: Not Met   Progress in Treatment: Attending groups: No. Participating in groups: No. Taking medication as prescribed: Yes. Toleration medication: Yes. Family/Significant other contact made: No, will contact:  if consent is given Patient understands diagnosis: Yes. Discussing patient identified problems/goals with staff: Yes. Medical problems stabilized or resolved: Yes. Denies suicidal/homicidal ideation: Yes. Issues/concerns per patient self-inventory: No. Other: none  New problem(s) identified: Yes, Describe:  Lack of stable housing   New Short Term/Long Term Goal(s): medication stabilization, elimination of SI thoughts, development of comprehensive mental wellness plan.   Patient Goals:  "To get out"  Discharge Plan or Barriers:  Patient recently admitted. CSW will continue to follow and assess for appropriate referrals and possible discharge planning.   Reason for Continuation of Hospitalization: Anxiety Depression Medication stabilization  Estimated Length of Stay: 3-5 days  Attendees: Patient: Jake Neal 12/09/2019   Physician: Myles Lipps, MD 12/09/2019  Nursing:  12/09/2019   RN Care Manager: 12/09/2019   Social Worker: Darletta Moll, [place] 12/09/2019  Recreational Therapist:  12/09/2019   Other:  12/09/2019  Other:  12/09/2019   Other: 12/09/2019  Scribe for Treatment Team: Doristine Johns 12/09/2019 12:05 PM

## 2019-12-09 NOTE — [facility] Suicide Risk Assessment (Signed)
[facility] INPATIENT:  Family/Significant Other Suicide Prevention Education   Suicide Prevention Education:  Patient Refusal for Family/Significant Other Suicide Prevention Education: The patient Jake Neal has refused to provide written consent for family/significant other to be provided Family/Significant Other Suicide Prevention Education during admission and/or prior to discharge.  Physician notified.   SPE completed with patient, as patient refused to consent to family contact. SPI pamphlet provided to pt and pt was encouraged to share information with support network, ask questions, and talk about any concerns relating to SPE. Patient denies access to guns/firearms and verbalized understanding of information provided. Mobile Crisis information also provided to patient.   Ruthann Cancer MSW, [employer] Clincal Social Worker  [facility]

## 2019-12-09 NOTE — Progress Notes (Signed)
Pt paranoid on the unit, pt initially tried to refuse his night medication. Pt informed how important it was to take medications and continue to take them when he D/C

## 2019-12-09 NOTE — Progress Notes (Signed)
Patient ID: Jake Neal, male   DOB: 04/23/1991, 29 y.o.   MRN: [id number] Patient was involuntarily committed by his aunt who reported that patient threatened to kill himself. Patient has been off medications and developed some odd behaviors such as talking to self, increased depression, increased paranoia, and obsessive thoughts about self harm. Upon admission, patient is disheveled with poor hygiene,  guarded and denying everything "yeah I talk to myself but that is nothing...".  Patient admits that he has not been taking his medications. Reports that he used to take injectable medication but does not want to take IM anymore. Patient reports history of trauma when he was in jail but refuses to provide details. Currently homeless and unemployed. During this hospitalization, patient will work on "my depression and my medications". Skin assessment performed and there are no concerns to report. Patient received a meal and was admitted/oriented to the unit. Safety precautions initiated.

## 2019-12-09 NOTE — Plan of Care (Signed)
Newly admitted. Patient continues to report that "nothing wrong with me..just I just talk to myself because I want to...". Patient has poor hygiene and body odor but refusing to shower. Reports that he will never, shower, that he hasn't showered  for 3 months. Patient received a meal then went to bed. Currently sleeping and safety maintained.
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
## 2019-12-09 NOTE — Progress Notes (Signed)
Nutrition Brief Note  Patient identified on the Malnutrition Screening Tool (MST) Report  No weight loss per records.  Wt Readings from Last 15 Encounters:  12/09/19 67.1 kg  12/08/19 65.8 kg  10/22/19 65.8 kg  09/16/19 63.5 kg  08/12/18 61.2 kg  08/02/18 61.2 kg  07/04/16 65.8 kg    Body mass index is 21.24 kg/m. Patient meets criteria for normal based on current BMI.   Labs and medications reviewed.   No nutrition interventions warranted at this time. If nutrition issues arise, please consult RD.   Tilda Franco, MS, RD, LDN Inpatient Clinical Dietitian Contact information available via Amion

## 2019-12-09 NOTE — [facility] Suicide Risk Assessment (Signed)
[facility] Admission Suicide Risk Assessment   Nursing information obtained from:  Patient Demographic factors:  Male, Low socioeconomic status, Unemployed, Adolescent or young adult Current Mental Status:  NA Loss Factors:  Financial problems / change in socioeconomic status Historical Factors:  NA Risk Reduction Factors:  NA  Total Time spent with patient: 30 minutes Principal Problem: Schizophrenia, paranoid type (HCC) Diagnosis:  Principal Problem:   Schizophrenia, paranoid type (HCC) Active Problems:   Psychosis (HCC)  Subjective Data: Patient is seen and examined.  Patient is a 29 year old male with a known past psychiatric history significant for schizophrenia who presented to the [facility] emergency department on 12/08/2019.  He had been placed under involuntary commitment.  Reportedly the patient was responding to internal stimuli, having auditory hallucinations, talking out loud, and having anger episodes.  The patient had recently been discharged from our facility on 10/25/2019.  His diagnosis at that time was schizophrenia.  His family also involuntarily committed him at that time.  He has also previously been diagnosed with posttraumatic stress disorder.  During the course of that hospitalization he was placed on haloperidol.  He had also previously been treated with Zyprexa.  Review of the electronic medical record revealed that he had also been previously treated with a long-acting Abilify injection.  On his last hospitalization he refused injectable medications.  This morning he is a bit irritable, appears to be responding to internal stimuli, and requesting discharge.  He was placed on involuntary commitment this morning.  He was admitted to the hospital for evaluation and stabilization.  Continued Clinical Symptoms:  Alcohol Use Disorder Identification Test Final Score (AUDIT): 0 The "Alcohol Use Disorders Identification Test", Guidelines for Use in Primary Care, Second Edition.  World  Science writer [facility]). Score between 0-7:  no or low risk or alcohol related problems. Score between 8-15:  moderate risk of alcohol related problems. Score between 16-19:  high risk of alcohol related problems. Score 20 or above:  warrants further diagnostic evaluation for alcohol dependence and treatment.   CLINICAL FACTORS:   Schizophrenia:   Less than 79 years old Paranoid or undifferentiated type   Musculoskeletal: Strength & Muscle Tone: within normal limits Gait & Station: normal Patient leans: N/A  Psychiatric Specialty Exam: Physical Exam Vitals and nursing note reviewed.  HENT:     Head: Normocephalic and atraumatic.  Pulmonary:     Effort: Pulmonary effort is normal.  Neurological:     General: No focal deficit present.     Mental Status: He is alert.     Review of Systems  Blood pressure 117/70, pulse 90, temperature 97.9 F (36.6 C), temperature source Oral, resp. rate 16, height 5\' 10"  (1.778 m), weight 67.1 kg, SpO2 98 %.Body mass index is 21.24 kg/m.  General Appearance: Disheveled  Eye Contact:  Fair  Speech:  Normal Rate  Volume:  Decreased  Mood:  Dysphoric  Affect:  Labile  Thought Process:  Goal Directed and Descriptions of Associations: Loose  Orientation:  Negative  Thought Content:  Delusions and Hallucinations: Auditory  Suicidal Thoughts:  No  Homicidal Thoughts:  No  Memory:  Immediate;   Poor Recent;   Poor Remote;   Poor  Judgement:  Impaired  Insight:  Lacking  Psychomotor Activity:  Increased  Concentration:  Concentration: Fair and Attention Span: Fair  Recall:  of Knowledge:  Fair  Language:  Good  Akathisia:  Negative  Handed:  Right  AIMS (if indicated):  Assets:  Desire for Improvement Resilience  ADL's:  Intact  Cognition:  WNL  Sleep:  Number of Hours: 1.5      COGNITIVE FEATURES THAT CONTRIBUTE TO RISK:  Thought constriction (tunnel vision)    SUICIDE RISK:   Moderate:  Frequent suicidal  ideation with limited intensity, and duration, some specificity in terms of plans, no associated intent, good self-control, limited dysphoria/symptomatology, some risk factors present, and identifiable protective factors, including available and accessible social support.  PLAN OF CARE: Patient is seen and examined.  Patient is a 29 year old male with the above-stated past psychiatric history who was admitted secondary to probable noncompliance with medications and worsening psychotic symptoms.  He will be admitted to the hospital.  He will be integrated into the milieu.  He will be encouraged to attend groups.  He will be started on his previous haloperidol dose.  He will get 5 mg p.o. daily and 10 mg p.o. nightly.  I will continue to encourage him to go on the long-acting injectable Haldol.  We will also give him Cogentin for the possible side effects of Haldol.  Review of his admission laboratories revealed normal electrolytes including liver function enzymes.  His white blood cell count was slightly low at 3.2.  His platelets were clumped, so an accurate platelet count was not achieved.  Acetaminophen was less than 10, salicylate was less than 7.  His blood alcohol was less than 10.  Drug screen was negative.  His vital signs are stable, he is afebrile.  He only slept 1.5 hours last night.  I certify that inpatient services furnished can reasonably be expected to improve the patient's condition.   Antonieta Pert, MD 12/09/2019, 11:16 AM

## 2019-12-09 NOTE — Progress Notes (Signed)
   12/09/19 2000  Psych Admission Type (Psych Patients Only)  Admission Status Involuntary  Psychosocial Assessment  Eye Contact Avoids  Facial Expression Sad;Flat  Affect Irritable;Depressed;Sad  Speech Argumentative  Interaction Cautious;Hypervigilant;Guarded  Motor Activity Slow  Appearance/Hygiene Body odor;Bizarre;Poor hygiene  Behavior Characteristics Cooperative  Mood Preoccupied  Aggressive Behavior  Effect No apparent injury  Thought Process  Coherency WDL  Content WDL  Delusions WDL  Perception WDL  Hallucination None reported or observed  Judgment WDL  Confusion WDL  Danger to Self  Current suicidal ideation? Denies  Agreement Not to Harm Self Yes  Description of Agreement verbal  Danger to Others  Danger to Others None reported or observed

## 2019-12-10 NOTE — [facility] Group Notes (Signed)
LCSW Group Therapy Note  12/10/2019   10:00-11:00am   Type of Therapy and Topic:  Group Therapy: Anger Cues and Responses  Participation Level:  Did Not Attend   Description of Group:   In this group, patients learned how to recognize the physical, cognitive, emotional, and behavioral responses they have to anger-provoking situations.  They identified a recent time they became angry and how they reacted.  They analyzed how their reaction was possibly beneficial and how it was possibly unhelpful.  The group discussed a variety of healthier coping skills that could help with such a situation in the future.  Focus was placed on how helpful it is to recognize the underlying emotions to our anger, because working on those can lead to a more permanent solution as well as our ability to focus on the important rather than the urgent.  Therapeutic Goals: 1. Patients will remember their last incident of anger and how they felt emotionally and physically, what their thoughts were at the time, and how they behaved. 2. Patients will identify how their behavior at that time worked for them, as well as how it worked against them. 3. Patients will explore possible new behaviors to use in future anger situations. 4. Patients will learn that anger itself is normal and cannot be eliminated, and that healthier reactions can assist with resolving conflict rather than worsening situations.  Summary of Patient Progress:  The patient did not attend. Therapeutic Modalities:   Cognitive Behavioral Therapy  Evorn Gong

## 2019-12-10 NOTE — Progress Notes (Signed)
[facility] MD Progress Note  12/10/2019 10:24 AM Jake Neal  MRN:  [id number] Subjective: Patient is a 29 year old male with a known past psychiatric history significant for schizophrenia who presented to the [facility] emergency department on 12/08/2019 under involuntary commitment.  He was noted to be responding to internal stimuli, having auditory hallucinations, and having anger episodes.  Objective: Patient is seen and examined.  Patient is a 29 year old male with the above-stated past psychiatric history who is seen in follow-up.  He is laying in bed today.  He slept better last night.  He did attempt to refuse the Haldol last night, but ended up taking it.  This morning he denied any auditory or visual hallucinations.  He denied any suicidal or homicidal ideation.  Nursing staff discussed with me their concerns over his hygiene.  I did discuss with him this morning encouraging him to take a shower, and he stated "I will take one when I get home to my aunts".  Review of his laboratories showed no new lab work today.  His vital signs are stable, he is afebrile.  He slept 9.75 hours last night.  Principal Problem: Schizophrenia, paranoid type (HCC) Diagnosis: Principal Problem:   Schizophrenia, paranoid type (HCC) Active Problems:   Psychosis (HCC)  Total Time spent with patient: 15 minutes  Past Psychiatric History: See admission H&P.  Past Medical History:  Past Medical History:  Diagnosis Date  . Anxiety   . Depression   . Medical history non-contributory   . Schizophrenia (HCC)    History reviewed. No pertinent surgical history. Family History: History reviewed. No pertinent family history. Family Psychiatric  History: See admission H&P. Social History:  Social History   Substance and Sexual Activity  Alcohol Use No     Social History   Substance and Sexual Activity  Drug Use Yes  . Types: Marijuana   Comment: Currently denying substance use    Social History    Socioeconomic History  . Marital status: Single    Spouse name: Not on file  . Number of children: Not on file  . Years of education: Not on file  . Highest education level: Not on file  Occupational History  . Not on file  Tobacco Use  . Smoking status: Former Games developer  . Smokeless tobacco: Never Used  Substance and Sexual Activity  . Alcohol use: No  . Drug use: Yes    Types: Marijuana    Comment: Currently denying substance use  . Sexual activity: Yes    Birth control/protection: None  Other Topics Concern  . Not on file  Social History Narrative  . Not on file   Social Determinants of Health   Financial Resource Strain:   . Difficulty of Paying Living Expenses:   Food Insecurity:   . Worried About Programme researcher, broadcasting/film/video in the Last Year:   . Barista in the Last Year:   Transportation Needs:   . Freight forwarder (Medical):   Marland Kitchen Lack of Transportation (Non-Medical):   Physical Activity:   . Days of Exercise per Week:   . Minutes of Exercise per Session:   Stress:   . Feeling of Stress :   Social Connections:   . Frequency of Communication with Friends and Family:   . Frequency of Social Gatherings with Friends and Family:   . Attends Religious Services:   . Active Member of Clubs or Organizations:   . Attends Banker Meetings:   .  Marital Status:    Additional Social History:                         Sleep: Good  Appetite:  Good  Current Medications: Current Facility-Administered Medications  Medication Dose Route Frequency Provider Last Rate Last Admin  . acetaminophen (TYLENOL) tablet 650 mg  650 mg Oral Q6H PRN Antonieta Pert, MD      . alum & mag hydroxide-simeth (MAALOX/MYLANTA) 200-200-20 MG/5ML suspension 30 mL  30 mL Oral Q4H PRN Antonieta Pert, MD      . benztropine (COGENTIN) tablet 0.5 mg  0.5 mg Oral BID Antonieta Pert, MD   0.5 mg at 12/10/19 2778  . haloperidol (HALDOL) tablet 10 mg  10 mg Oral  QHS Antonieta Pert, MD   10 mg at 12/09/19 2012   Or  . haloperidol lactate (HALDOL) injection 10 mg  10 mg Intramuscular QHS Antonieta Pert, MD      . haloperidol (HALDOL) tablet 5 mg  5 mg Oral Daily Antonieta Pert, MD   5 mg at 12/10/19 2423   Or  . haloperidol lactate (HALDOL) injection 5 mg  5 mg Intramuscular Daily Antonieta Pert, MD      . hydrOXYzine (ATARAX/VISTARIL) tablet 50 mg  50 mg Oral TID PRN Antonieta Pert, MD      . OLANZapine zydis (ZYPREXA) disintegrating tablet 10 mg  10 mg Oral Q8H PRN Armandina Stammer I, NP       And  . LORazepam (ATIVAN) tablet 1 mg  1 mg Oral PRN Armandina Stammer I, NP       And  . ziprasidone (GEODON) injection 20 mg  20 mg Intramuscular PRN Nwoko, Agnes I, NP      . magnesium hydroxide (MILK OF MAGNESIA) suspension 30 mL  30 mL Oral Daily PRN Antonieta Pert, MD      . traZODone (DESYREL) tablet 100 mg  100 mg Oral QHS PRN Antonieta Pert, MD        Lab Results:  Results for orders placed or performed during the hospital encounter of 12/08/19 (from the past 48 hour(s))  Rapid urine drug screen (hospital performed)     Status: None   Collection Time: 12/08/19 12:54 PM  Result Value Ref Range   Opiates NONE DETECTED NONE DETECTED   Cocaine NONE DETECTED NONE DETECTED   Benzodiazepines NONE DETECTED NONE DETECTED   Amphetamines NONE DETECTED NONE DETECTED   Tetrahydrocannabinol NONE DETECTED NONE DETECTED   Barbiturates NONE DETECTED NONE DETECTED    Comment: (NOTE) DRUG SCREEN FOR MEDICAL PURPOSES ONLY.  IF CONFIRMATION IS NEEDED FOR ANY PURPOSE, NOTIFY LAB WITHIN 5 DAYS.  LOWEST DETECTABLE LIMITS FOR URINE DRUG SCREEN Drug Class                     Cutoff (ng/mL) Amphetamine and metabolites    1000 Barbiturate and metabolites    200 Benzodiazepine                 200 Tricyclics and metabolites     300 Opiates and metabolites        300 Cocaine and metabolites        300 THC                            50 Performed  at [facility], [address] [place], [place] [address]   Comprehensive metabolic panel     Status: Abnormal   Collection Time: 12/08/19  1:09 PM  Result Value Ref Range   Sodium 141 135 - 145 mmol/L   Potassium 4.3 3.5 - 5.1 mmol/L   Chloride 105 98 - 111 mmol/L   CO2 26 22 - 32 mmol/L   Glucose, Bld 87 70 - 99 mg/dL    Comment: Glucose reference range applies only to samples taken after fasting for at least 8 hours.   BUN 11 6 - 20 mg/dL   Creatinine, Ser 9.141.08 0.61 - 1.24 mg/dL   Calcium 9.9 8.9 - 78.210.3 mg/dL   Total Protein 7.3 6.5 - 8.1 g/dL   Albumin 4.4 3.5 - 5.0 g/dL   AST 17 15 - 41 U/L   ALT 17 0 - 44 U/L   Alkaline Phosphatase 53 38 - 126 U/L   Total Bilirubin 1.5 (H) 0.3 - 1.2 mg/dL   GFR calc non Af Amer >60 >60 mL/min   GFR calc Af Amer >60 >60 mL/min   Anion gap 10 5 - 15    Comment: Performed at [facility], [address] [place], [place] [address]  Ethanol     Status: None   Collection Time: 12/08/19  1:09 PM  Result Value Ref Range   Alcohol, Ethyl (B) <10 <10 mg/dL    Comment: (NOTE) Lowest detectable limit for serum alcohol is 10 mg/dL.  For medical purposes only. Performed at [facility], [address] [place], [place] [address]   Salicylate level     Status: Abnormal   Collection Time: 12/08/19  1:09 PM  Result Value Ref Range   Salicylate Lvl <7.0 (L) 7.0 - 30.0 mg/dL    Comment: Performed at [facility], [address] [place], [place] [address]  Acetaminophen level     Status: Abnormal   Collection Time: 12/08/19  1:09 PM  Result Value Ref Range   Acetaminophen (Tylenol), Serum <10 (L) 10 - 30 ug/mL    Comment: (NOTE) Therapeutic concentrations vary significantly. A range of 10-30 ug/mL  may be an effective concentration for many patients. However, some  are best treated at concentrations outside of this range. Acetaminophen concentrations >150 ug/mL at 4 hours after ingestion  and >50 ug/mL at 12 hours after  ingestion are often associated with  toxic reactions.  Performed at [facility], [address] [place], [place] [address]   cbc     Status: Abnormal   Collection Time: 12/08/19  1:09 PM  Result Value Ref Range   WBC 3.2 (L) 4.0 - 10.5 K/uL   RBC 4.65 4.22 - 5.81 MIL/uL   Hemoglobin 14.8 13.0 - 17.0 g/dL   HCT 84.144.4 39 - 52 %   MCV 95.5 80.0 - 100.0 fL   MCH 31.8 26.0 - 34.0 pg   MCHC 33.3 30.0 - 36.0 g/dL   RDW 32.412.3 40.111.5 - 02.715.5 %   Platelets PLATELET CLUMPS NOTED ON SMEAR, UNABLE TO ESTIMATE 150 - 400 K/uL    Comment: PLATELET CLUMPING, SUGGEST RECOLLECTION OF SAMPLE IN CITRATE TUBE. PLATELET CLUMPS NOTED ON SMEAR, COUNT APPEARS ADEUATE Immature Platelet Fraction may be clinically indicated, consider ordering this additional test [id number]    nRBC 0.0 0.0 - 0.2 %    Comment: Performed at [facility], [address] [place], [place] [address]  SARS Coronavirus 2 by RT PCR (hospital order,  performed in [facility] hospital lab) Nasopharyngeal Nasopharyngeal Swab     Status: None   Collection Time: 12/08/19  9:45 PM   Specimen: Nasopharyngeal Swab  Result Value Ref Range   SARS Coronavirus 2 NEGATIVE NEGATIVE    Comment: (NOTE) SARS-CoV-2 target nucleic acids are NOT DETECTED.  The SARS-CoV-2 RNA is generally detectable in upper and lower respiratory specimens during the acute phase of infection. The lowest concentration of SARS-CoV-2 viral copies this assay can detect is 250 copies / mL. A negative result does not preclude SARS-CoV-2 infection and should not be used as the sole basis for treatment or other patient management decisions.  A negative result may occur with improper specimen collection / handling, submission of specimen other than nasopharyngeal swab, presence of viral mutation(s) within the areas targeted by this assay, and inadequate number of viral copies (<250 copies / mL). A negative result must be combined with clinical observations, patient  history, and epidemiological information.  Fact Sheet for Patients:   BoilerBrush.com.cy  Fact Sheet for Healthcare Providers: https://pope.com/  This test is not yet approved or  cleared by the [place] FDA and has been authorized for detection and/or diagnosis of SARS-CoV-2 by FDA under an Emergency Use Authorization (EUA).  This EUA will remain in effect (meaning this test can be used) for the duration of the COVID-19 declaration under Section 564(b)(1) of the Act, 21 U.S.C. section 360bbb-3(b)(1), unless the authorization is terminated or revoked sooner.  Performed at [facility], [address] [place], [place] [address]     Blood Alcohol level:  Lab Results  Component Value Date   ETH <10 12/08/2019   ETH <10 07/31/2018    Metabolic Disorder Labs: Lab Results  Component Value Date   HGBA1C 5.0 10/22/2019   MPG 96.8 10/22/2019   MPG 93.93 08/03/2018   Lab Results  Component Value Date   PROLACTIN 9.9 07/06/2016   PROLACTIN 14.0 07/04/2016   Lab Results  Component Value Date   CHOL 219 (H) 10/22/2019   TRIG 103 10/22/2019   HDL 61 10/22/2019   CHOLHDL 3.6 10/22/2019   VLDL 21 10/22/2019   LDLCALC 137 (H) 10/22/2019   LDLCALC 138 (H) 08/03/2018    Physical Findings: AIMS: Facial and Oral Movements Muscles of Facial Expression: None, normal Lips and Perioral Area: None, normal Jaw: None, normal Tongue: None, normal,Extremity Movements Upper (arms, wrists, hands, fingers): None, normal Lower (legs, knees, ankles, toes): None, normal, Trunk Movements Neck, shoulders, hips: None, normal, Overall Severity Severity of abnormal movements (highest score from questions above): None, normal Incapacitation due to abnormal movements: None, normal Patient's awareness of abnormal movements (rate only patient's report): No Awareness, Dental Status Current problems with teeth and/or dentures?: No Does  patient usually wear dentures?: No  CIWA:    COWS:     Musculoskeletal: Strength & Muscle Tone: within normal limits Gait & Station: normal Patient leans: N/A  Psychiatric Specialty Exam: Physical Exam Vitals and nursing note reviewed.  HENT:     Head: Normocephalic and atraumatic.  Pulmonary:     Effort: Pulmonary effort is normal.  Neurological:     General: No focal deficit present.     Mental Status: He is alert.     Review of Systems  Blood pressure 113/73, pulse 87, temperature 98.2 F (36.8 C), temperature source Oral, resp. rate 16, height  (1.778 m), weight 67.1 kg, SpO2 98 %.Body mass index is 21.24 kg/m.  General Appearance: Disheveled  Eye Contact:  Fair  Speech:  Normal Rate  Volume:  Decreased  Mood:  Dysphoric  Affect:  Flat  Thought Process:  Goal Directed and Descriptions of Associations: Circumstantial  Orientation:  Full (Time, Place, and Person)  Thought Content:  Delusions and Paranoid Ideation  Suicidal Thoughts:  No  Homicidal Thoughts:  No  Memory:  Immediate;   Poor Recent;   Poor Remote;   Poor  Judgement:  Impaired  Insight:  Lacking  Psychomotor Activity:  Decreased  Concentration:  Concentration: Fair and Attention Span: Fair  Recall:  [employer] of Knowledge:  Fair  Language:  Fair  Akathisia:  Negative  Handed:  Right  AIMS (if indicated):     Assets:  Desire for Improvement Resilience  ADL's:  Impaired  Cognition:  WNL  Sleep:  Number of Hours: 9.75     Treatment Plan Summary: Daily contact with patient to assess and evaluate symptoms and progress in treatment, Medication management and Plan : Patient is seen and examined.  Patient is a 29 year old male with the above-stated past psychiatric history who is seen in follow-up.   Diagnosis: 1.  Schizophrenia  Pertinent findings on examination today: 1.  Still guarded and appearing paranoid. 2.  Patient denies auditory or visual hallucinations. 3.  He did sleep much  better last night and was compliant with medications. 4.  Hygiene is significantly poor. 5.  Will discuss with the possibility of a long-acting Haldol injection once he clears a bit more.  Plan: 1.  Continue Cogentin 0.5 mg p.o. twice daily for side effects of medication. 2.  Continue haloperidol 5 mg p.o. or IM daily and 10 mg p.o. or IM nightly.  This is for psychosis. 3.  Continue hydroxyzine 50 mg p.o. 3 times daily as needed anxiety. 4.  Continue olanzapine/lorazepam/Geodon agitation protocol as needed. 5.  Continue trazodone 100 mg p.o. nightly as needed insomnia. 6.  Disposition planning-in progress.  Antonieta Pert, MD 12/10/2019, 10:24 AM

## 2019-12-11 NOTE — Plan of Care (Signed)
Progress note  D: pt found in bed; compliant with medication administration. Pt expresses thoughts of rage and intrusive thoughts with the reasoning for their readmission. Pt states this also drives them to be isolative because they don't want this feelings to turn to rage towards others. Pt is animated on some topics, but seems sad/sullen and preoccupied with others. Pt expresses readiness for discharge. Pt provided information on treatment plan going forward. Pt verbalizes understanding. Pt denies si/hi/ah/vh and verbally agrees to approach staff if these become apparent or before harming themself/others while at [facility].  A: Pt provided support and encouragement. Pt given medication per protocol and standing orders. Q84m safety checks implemented and continued.  R: Pt safe on the unit. Will continue to monitor.  Pt progressing in the following metrics  Problem: Education: Goal: Knowledge of [facility] [address] [facility] Education information/materials will improve Outcome: Progressing   Problem: Health Behavior/Discharge Planning: Goal: Compliance with treatment plan for underlying cause of condition will improve Outcome: Progressing   Problem: Physical Regulation: Goal: Ability to maintain clinical measurements within normal limits will improve Outcome: Progressing   Problem: Education: Goal: Knowledge of the prescribed therapeutic regimen will improve Outcome: Progressing

## 2019-12-11 NOTE — Progress Notes (Signed)
   12/10/19 2055  COVID-19 Daily Checkoff  Have you had a fever (temp > 37.80C/100F)  in the past 24 hours?  No  If you have had runny nose, nasal congestion, sneezing in the past 24 hours, has it worsened? No  COVID-19 EXPOSURE  Have you traveled outside the state in the past 14 days? No  Have you been in contact with someone with a confirmed diagnosis of COVID-19 or PUI in the past 14 days without wearing appropriate PPE? No  Have you been living in the same home as a person with confirmed diagnosis of COVID-19 or a PUI (household contact)? No  Have you been diagnosed with COVID-19? No

## 2019-12-11 NOTE — Progress Notes (Signed)
   12/10/19 2050  Psych Admission Type (Psych Patients Only)  Admission Status Involuntary  Psychosocial Assessment  Eye Contact Avoids  Facial Expression Sad;Flat  Affect Irritable;Depressed;Sad  Speech Argumentative  Interaction Cautious;Hypervigilant;Guarded  Motor Activity Slow  Appearance/Hygiene Body odor;Bizarre;Poor hygiene  Aggressive Behavior  Effect No apparent injury  Thought Process  Coherency WDL  Content WDL  Delusions WDL  Perception WDL  Hallucination None reported or observed  Judgment WDL  Confusion WDL  Danger to Self  Current suicidal ideation? Denies  Self-Injurious Behavior No self-injurious ideation or behavior indicators observed or expressed   Agreement Not to Harm Self Yes  Description of Agreement verbal  Danger to Others  Danger to Others None reported or observed     12/10/19 2050  Psych Admission Type (Psych Patients Only)  Admission Status Involuntary  Psychosocial Assessment  Eye Contact Avoids  Facial Expression Sad;Flat  Affect Irritable;Depressed;Sad  Speech Argumentative  Interaction Cautious;Hypervigilant;Guarded  Motor Activity Slow  Appearance/Hygiene Body odor;Bizarre;Poor hygiene  Aggressive Behavior  Effect No apparent injury  Thought Process  Coherency WDL  Content WDL  Delusions WDL  Perception WDL  Hallucination None reported or observed  Judgment WDL  Confusion WDL  Danger to Self  Current suicidal ideation? Denies  Self-Injurious Behavior No self-injurious ideation or behavior indicators observed or expressed   Agreement Not to Harm Self Yes  Description of Agreement verbal  Danger to Others  Danger to Others None reported or observed

## 2019-12-11 NOTE — Progress Notes (Addendum)
Patient has been up in his room or either walking the hallway. He reports that he does not care to be around other people.  He was complaint with his medication. Pleasant and cooperative   12/11/19 2100  Psych Admission Type (Psych Patients Only)  Admission Status Involuntary  Psychosocial Assessment  Eye Contact Fair  Facial Expression Animated;Anxious;Pensive;Worried  Affect Anxious;Depressed;Preoccupied  Furniture conservator/restorer  Appearance/Hygiene Improved;In scrubs  Aggressive Behavior  Effect No apparent injury  Thought Process  Coherency WDL  Content Blaming self;Obsessions  Delusions Persecutory  Perception WDL  Hallucination None reported or observed  Judgment Poor  Confusion None  Danger to Self  Current suicidal ideation? Denies  Self-Injurious Behavior No self-injurious ideation or behavior indicators observed or expressed   Agreement Not to Harm Self Yes  Description of Agreement verbal  Danger to Others  Danger to Others None reported or observed  .

## 2019-12-11 NOTE — Progress Notes (Signed)
°   12/11/19 2100  COVID-19 Daily Checkoff  Have you had a fever (temp > 37.80C/100F)  in the past 24 hours?  No  If you have had runny nose, nasal congestion, sneezing in the past 24 hours, has it worsened? No  COVID-19 EXPOSURE  Have you traveled outside the state in the past 14 days? No  Have you been in contact with someone with a confirmed diagnosis of COVID-19 or PUI in the past 14 days without wearing appropriate PPE? No  Have you been diagnosed with COVID-19? No

## 2019-12-11 NOTE — [facility] Group Notes (Signed)
[facility] LCSW Group Therapy Note  Date/Time:  12/11/2019  11:00AM-12:00PM  Type of Therapy and Topic:  Group Therapy:  Music and Mood  Participation Level:  Active   Description of Group: In this process group, members listened to a variety of genres of music and identified that different types of music evoke different responses.  Patients were encouraged to identify music that was soothing for them and music that was energizing for them.  Patients discussed how this knowledge can help with wellness and recovery in various ways including managing depression and anxiety as well as encouraging healthy sleep habits.    Therapeutic Goals: 1. Patients will explore the impact of different varieties of music on mood 2. Patients will verbalize the thoughts they have when listening to different types of music 3. Patients will identify music that is soothing to them as well as music that is energizing to them 4. Patients will discuss how to use this knowledge to assist in maintaining wellness and recovery 5. Patients will explore the use of music as a coping skill  Summary of Patient Progress: Patient was attentive in music group and participated appropriately.  Therapeutic Modalities: Solution Focused Brief Therapy Activity  Teryn Boerema, LCSW    

## 2019-12-11 NOTE — Progress Notes (Signed)
[facility] MD Progress Note  12/11/2019 2:33 PM Jake Neal  MRN:  [id number] Subjective:  Patient is a 29 year old male with a known past psychiatric history significant for schizophrenia who presented to the [facility] emergency department on 12/08/2019 under involuntary commitment.  He was noted to be responding to internal stimuli, having auditory hallucinations, and having anger episodes.  Objective: Patient is seen and examined.  Patient is a 29 year old male with the above-stated past psychiatric history seen in follow-up.  He is essentially unchanged.  He denied any auditory or visual hallucinations.  He denied any suicidal or homicidal ideation.  We discussed the possibility of going on the long-acting Haldol injection, and after discussion he continues to decide against it.  I tried to explain the ease with which he would occur, and the fact he would have to worry about his oral medicines, but again he continues to decline this.  No new laboratories.  His vital signs are stable, he is afebrile.  He slept 6.5 hours last night.  Principal Problem: Schizophrenia, paranoid type (HCC) Diagnosis: Principal Problem:   Schizophrenia, paranoid type (HCC) Active Problems:   Psychosis (HCC)  Total Time spent with patient: 15 minutes  Past Psychiatric History: See admission H&P  Past Medical History:  Past Medical History:  Diagnosis Date  . Anxiety   . Depression   . Medical history non-contributory   . Schizophrenia (HCC)    History reviewed. No pertinent surgical history. Family History: History reviewed. No pertinent family history. Family Psychiatric  History: See admission H&P Social History:  Social History   Substance and Sexual Activity  Alcohol Use No     Social History   Substance and Sexual Activity  Drug Use Yes  . Types: Marijuana   Comment: Currently denying substance use    Social History   Socioeconomic History  . Marital status: Single    Spouse name: Not on  file  . Number of children: Not on file  . Years of education: Not on file  . Highest education level: Not on file  Occupational History  . Not on file  Tobacco Use  . Smoking status: Former Games developer  . Smokeless tobacco: Never Used  Substance and Sexual Activity  . Alcohol use: No  . Drug use: Yes    Types: Marijuana    Comment: Currently denying substance use  . Sexual activity: Yes    Birth control/protection: None  Other Topics Concern  . Not on file  Social History Narrative  . Not on file   Social Determinants of Health   Financial Resource Strain:   . Difficulty of Paying Living Expenses:   Food Insecurity:   . Worried About Programme researcher, broadcasting/film/video in the Last Year:   . Barista in the Last Year:   Transportation Needs:   . Freight forwarder (Medical):   Marland Kitchen Lack of Transportation (Non-Medical):   Physical Activity:   . Days of Exercise per Week:   . Minutes of Exercise per Session:   Stress:   . Feeling of Stress :   Social Connections:   . Frequency of Communication with Friends and Family:   . Frequency of Social Gatherings with Friends and Family:   . Attends Religious Services:   . Active Member of Clubs or Organizations:   . Attends Banker Meetings:   Marland Kitchen Marital Status:    Additional Social History:  Sleep: Good  Appetite:  Good  Current Medications: Current Facility-Administered Medications  Medication Dose Route Frequency Provider Last Rate Last Admin  . acetaminophen (TYLENOL) tablet 650 mg  650 mg Oral Q6H PRN Antonieta Pert, MD      . alum & mag hydroxide-simeth (MAALOX/MYLANTA) 200-200-20 MG/5ML suspension 30 mL  30 mL Oral Q4H PRN Antonieta Pert, MD      . benztropine (COGENTIN) tablet 0.5 mg  0.5 mg Oral BID Antonieta Pert, MD   0.5 mg at 12/11/19 1118  . haloperidol (HALDOL) tablet 10 mg  10 mg Oral QHS Antonieta Pert, MD   10 mg at 12/10/19 2050   Or  . haloperidol  lactate (HALDOL) injection 10 mg  10 mg Intramuscular QHS Antonieta Pert, MD      . haloperidol (HALDOL) tablet 5 mg  5 mg Oral Daily Antonieta Pert, MD   5 mg at 12/11/19 1118   Or  . haloperidol lactate (HALDOL) injection 5 mg  5 mg Intramuscular Daily Antonieta Pert, MD      . hydrOXYzine (ATARAX/VISTARIL) tablet 50 mg  50 mg Oral TID PRN Antonieta Pert, MD      . OLANZapine zydis (ZYPREXA) disintegrating tablet 10 mg  10 mg Oral Q8H PRN Armandina Stammer I, NP       And  . LORazepam (ATIVAN) tablet 1 mg  1 mg Oral PRN Armandina Stammer I, NP       And  . ziprasidone (GEODON) injection 20 mg  20 mg Intramuscular PRN Nwoko, Agnes I, NP      . magnesium hydroxide (MILK OF MAGNESIA) suspension 30 mL  30 mL Oral Daily PRN Antonieta Pert, MD      . traZODone (DESYREL) tablet 100 mg  100 mg Oral QHS PRN Antonieta Pert, MD        Lab Results: No results found for this or any previous visit (from the past 48 hour(s)).  Blood Alcohol level:  Lab Results  Component Value Date   ETH <10 12/08/2019   ETH <10 07/31/2018    Metabolic Disorder Labs: Lab Results  Component Value Date   HGBA1C 5.0 10/22/2019   MPG 96.8 10/22/2019   MPG 93.93 08/03/2018   Lab Results  Component Value Date   PROLACTIN 9.9 07/06/2016   PROLACTIN 14.0 07/04/2016   Lab Results  Component Value Date   CHOL 219 (H) 10/22/2019   TRIG 103 10/22/2019   HDL 61 10/22/2019   CHOLHDL 3.6 10/22/2019   VLDL 21 10/22/2019   LDLCALC 137 (H) 10/22/2019   LDLCALC 138 (H) 08/03/2018    Physical Findings: AIMS: Facial and Oral Movements Muscles of Facial Expression: None, normal Lips and Perioral Area: None, normal Jaw: None, normal Tongue: None, normal,Extremity Movements Upper (arms, wrists, hands, fingers): None, normal Lower (legs, knees, ankles, toes): None, normal, Trunk Movements Neck, shoulders, hips: None, normal, Overall Severity Severity of abnormal movements (highest score from questions  above): None, normal Incapacitation due to abnormal movements: None, normal Patient's awareness of abnormal movements (rate only patient's report): No Awareness, Dental Status Current problems with teeth and/or dentures?: No Does patient usually wear dentures?: No  CIWA:    COWS:     Musculoskeletal: Strength & Muscle Tone: within normal limits Gait & Station: normal Patient leans: N/A  Psychiatric Specialty Exam: Physical Exam Vitals and nursing note reviewed.  Constitutional:      Appearance: Normal appearance.  HENT:  Head: Normocephalic and atraumatic.  Pulmonary:     Effort: Pulmonary effort is normal.  Neurological:     General: No focal deficit present.     Mental Status: He is alert and oriented to person, place, and time.     Review of Systems  Blood pressure 111/65, pulse 91, temperature 97.8 F (36.6 C), temperature source Oral, resp. rate 18, height 5\' 10"  (1.778 m), weight 67.1 kg, SpO2 98 %.Body mass index is 21.24 kg/m.  General Appearance: Disheveled  Eye Contact:  Fair  Speech:  Normal Rate  Volume:  Decreased  Mood:  Euthymic  Affect:  Flat  Thought Process:  Coherent and Descriptions of Associations: Circumstantial  Orientation:  Full (Time, Place, and Person)  Thought Content:  Delusions  Suicidal Thoughts:  No  Homicidal Thoughts:  No  Memory:  Immediate;   Fair Recent;   Fair Remote;   Fair  Judgement:  Impaired  Insight:  Lacking  Psychomotor Activity:  Increased  Concentration:  Concentration: Fair and Attention Span: Fair  Recall:  of Knowledge:  Fair  Language:  Fair  Akathisia:  Negative  Handed:  Right  AIMS (if indicated):     Assets:  Desire for Improvement Resilience  ADL's:  Impaired  Cognition:  WNL  Sleep:  Number of Hours: 6.5     Treatment Plan Summary: Daily contact with patient to assess and evaluate symptoms and progress in treatment, Medication management and Plan : Patient is seen and examined.   Patient is a 29 year old male with the above-stated past psychiatric history who is seen in follow-up.   Diagnosis: 1.  Schizophrenia  Pertinent findings on examination today: 1.  Patient denies suicidal or homicidal ideation. 2.  Patient denied auditory or visual hallucinations. 3.  Sleep is improved. 4.  Hygiene continues to be poor. 5.  Patient continues to decline the long-acting Haldol injection.  Plan: 1.  Continue Cogentin 0.5 mg p.o. twice daily for side effects of medication. 2.  Continue haloperidol 5 mg p.o. or IM daily and 10 mg p.o. or IM nightly.  This is for psychosis. 3.  Continue hydroxyzine 50 mg p.o. 3 times daily as needed anxiety. 4.  Continue olanzapine/lorazepam/Geodon agitation protocol as needed. 5.  Continue trazodone 100 mg p.o. nightly as needed insomnia. 6.  Disposition planning-in progress.  34, MD 12/11/2019, 2:33 PM

## 2019-12-12 MED ORDER — HALOPERIDOL 10 MG PO TABS: 10.0000 mg | ORAL_TABLET | Freq: Every day | ORAL | 0 refills | Status: DC

## 2019-12-12 MED ORDER — TRAZODONE HCL 100 MG PO TABS: 100.0000 mg | ORAL_TABLET | Freq: Every evening | ORAL | 0 refills | Status: DC | PRN

## 2019-12-12 MED ORDER — HALOPERIDOL 5 MG PO TABS: 5.0000 mg | ORAL_TABLET | Freq: Every day | ORAL | 0 refills | Status: DC

## 2019-12-12 NOTE — BHH Suicide Risk Assessment (Signed)
[facility] Discharge Suicide Risk Assessment   Principal Problem: Schizophrenia, paranoid type [facility]) Discharge Diagnoses: Principal Problem:   Schizophrenia, paranoid type (HCC) Active Problems:   Psychosis (HCC)   Total Time spent with patient: 15 minutes  Musculoskeletal: Strength & Muscle Tone: within normal limits Gait & Station: normal Patient leans: N/A  Psychiatric Specialty Exam: Review of Systems  HENT: Positive for facial swelling.   All other systems reviewed and are negative.   Blood pressure 107/81, pulse (!) 107, temperature 97.8 F (36.6 C), temperature source Oral, resp. rate 18, height 5\' 10"  (1.778 m), weight 67.1 kg, SpO2 98 %.Body mass index is 21.24 kg/m.  General Appearance: Casual  Eye Contact::  Fair  Speech:  Normal Rate409  Volume:  Decreased  Mood:  Anxious  Affect:  Flat  Thought Process:  Coherent and Descriptions of Associations: Circumstantial  Orientation:  Full (Time, Place, and Person)  Thought Content:  Delusions and Paranoid Ideation  Suicidal Thoughts:  No  Homicidal Thoughts:  No  Memory:  Immediate;   Fair Recent;   Fair Remote;   Fair  Judgement:  Intact  Insight:  Lacking  Psychomotor Activity:  Normal  Concentration:  Fair  Recall:  [id number] of Knowledge:Fair  Language: Fair  Akathisia:  Negative  Handed:  Right  AIMS (if indicated):     Assets:  Desire for Improvement Resilience  Sleep:  Number of Hours: 6.5  Cognition: WNL  ADL's:  Intact   Mental Status Per Nursing Assessment::   On Admission:  NA  Demographic Factors:  Male, Low socioeconomic status, Living alone and Unemployed  Loss Factors: Financial problems/change in socioeconomic status  Historical Factors: Impulsivity  Risk Reduction Factors:   NA  Continued Clinical Symptoms:  Schizophrenia:   Less than 86 years old Paranoid or undifferentiated type  Cognitive Features That Contribute To Risk:  Thought constriction (tunnel vision)    Suicide  Risk:  Minimal: No identifiable suicidal ideation.  Patients presenting with no risk factors but with morbid ruminations; may be classified as minimal risk based on the severity of the depressive symptoms   Follow-up Information    [facility] Follow up.   Specialty: Behavioral Health Why: Please follow up with this provider for medication management and therapy services during their walk in hours, Monday through Thursday 8:30 am to 4:30 pm.  Contact information: [address] [place] [phone]              Plan Of Care/Follow-up recommendations:  Activity:  ad lib  [phone], MD 12/12/2019, 10:43 AM

## 2019-12-12 NOTE — Progress Notes (Signed)
D: Pt A & O X 3. Denies SI, HI, AVH and pain at this time. D/C home as ordered. Bus pass given for transportation. A: D/C instructions reviewed with pt including prescriptions and follow up appointment; compliance encouraged. All belongings from locker 32 given to pt at time of departure. Scheduled and PRN medications given with verbal education and effects monitored. Safety checks maintained without incident till time of d/c.  R: Pt receptive to care. Compliant with medications when offered. Denies adverse drug reactions when assessed. Verbalized understanding related to d/c instructions. Signed belonging sheet in agreement with items received from locker. Ambulatory with a steady gait. Appears to be in no physical distress at time of departure.

## 2019-12-12 NOTE — Discharge Summary (Signed)
Physician Discharge Summary Note  Patient:  Jake Neal is an 29 y.o., male MRN:  [id number] DOB:  30-Aug-1990 Patient phone:  [phone] (home)  Patient address:   [address] [place] [address],  Total Time spent with patient: 15 minutes  Date of Admission:  12/09/2019 Date of Discharge: 12/12/2019  Reason for Admission:  psychosis  Principal Problem: Schizophrenia, paranoid type [facility]) Discharge Diagnoses: Principal Problem:   Schizophrenia, paranoid type (HCC) Active Problems:   Psychosis (HCC)   Past Psychiatric History: History of schizophrenia with multiple prior hospitalizations here at [facility].  Past Medical History:  Past Medical History:  Diagnosis Date  . Anxiety   . Depression   . Medical history non-contributory   . Schizophrenia (HCC)    History reviewed. No pertinent surgical history. Family History: History reviewed. No pertinent family history. Family Psychiatric  History: Unknown Social History:  Social History   Substance and Sexual Activity  Alcohol Use No     Social History   Substance and Sexual Activity  Drug Use Yes  . Types: Marijuana   Comment: Currently denying substance use    Social History   Socioeconomic History  . Marital status: Single    Spouse name: Not on file  . Number of children: Not on file  . Years of education: Not on file  . Highest education level: Not on file  Occupational History  . Not on file  Tobacco Use  . Smoking status: Former Games developer  . Smokeless tobacco: Never Used  Substance and Sexual Activity  . Alcohol use: No  . Drug use: Yes    Types: Marijuana    Comment: Currently denying substance use  . Sexual activity: Yes    Birth control/protection: None  Other Topics Concern  . Not on file  Social History Narrative  . Not on file   Social Determinants of Health   Financial Resource Strain:   . Difficulty of Paying Living Expenses:   Food Insecurity:   . Worried About Programme researcher, broadcasting/film/video  in the Last Year:   . Barista in the Last Year:   Transportation Needs:   . Freight forwarder (Medical):   Marland Kitchen Lack of Transportation (Non-Medical):   Physical Activity:   . Days of Exercise per Week:   . Minutes of Exercise per Session:   Stress:   . Feeling of Stress :   Social Connections:   . Frequency of Communication with Friends and Family:   . Frequency of Social Gatherings with Friends and Family:   . Attends Religious Services:   . Active Member of Clubs or Organizations:   . Attends Banker Meetings:   Marland Kitchen Marital Status:     Hospital Course:  From admission H&P: Patient is a 28 year old male with a known past psychiatric history significant for schizophrenia who presented to the [facility] emergency department on 12/08/2019. He had been placed under involuntary commitment. Reportedly the patient was responding to internal stimuli, having auditory hallucinations, talking out loud, and having anger episodes. The patient had recently been discharged from our facility on 10/25/2019. His diagnosis at that time was schizophrenia. His family also involuntarily committed him at that time. He has also previously been diagnosed with posttraumatic stress disorder. During the course of that hospitalization he was placed on haloperidol. He had also previously been treated with Zyprexa. Review of the electronic medical record revealed that he had also been previously treated with a long-acting Abilify  injection. On his last hospitalization he refused injectable medications. This morning he is a bit irritable, appears to be responding to internal stimuli, and requesting discharge. He was placed on involuntary commitment this morning. He was admitted to the hospital for evaluation and stabilization.  Mr. Saiz was admitted for acute psychosis. He remained on the [facility] unit for three days. He was restarted on Haldol. He isolated to his room during hospitalization. He  responded well to treatment with no adverse effects reported. He has shown improved mood, affect, sleep, and interaction. He denies any SI/HI/AVH and contracts for safety. He is discharging on the medications listed below. He agrees to follow up at [facility] the [place] [facility] (see below). Patient is provided with prescriptions for medications upon discharge. He is discharging home with his aunt with bus pass.   Physical Findings: AIMS: Facial and Oral Movements Muscles of Facial Expression: None, normal Lips and Perioral Area: None, normal Jaw: None, normal Tongue: None, normal,Extremity Movements Upper (arms, wrists, hands, fingers): None, normal Lower (legs, knees, ankles, toes): None, normal, Trunk Movements Neck, shoulders, hips: None, normal, Overall Severity Severity of abnormal movements (highest score from questions above): None, normal Incapacitation due to abnormal movements: None, normal Patient's awareness of abnormal movements (rate only patient's report): No Awareness, Dental Status Current problems with teeth and/or dentures?: No Does patient usually wear dentures?: No  CIWA:    COWS:     Musculoskeletal: Strength & Muscle Tone: within normal limits Gait & Station: normal Patient leans: N/A  Psychiatric Specialty Exam: Physical Exam Vitals and nursing note reviewed.  Constitutional:      Appearance: He is well-developed.  Cardiovascular:     Rate and Rhythm: Normal rate.  Pulmonary:     Effort: Pulmonary effort is normal.  Neurological:     Mental Status: He is alert and oriented to person, place, and time.     Review of Systems  Constitutional: Negative.   Respiratory: Negative for cough and shortness of breath.   Psychiatric/Behavioral: Negative for agitation, behavioral problems, confusion, dysphoric mood, hallucinations, self-injury, sleep disturbance and suicidal ideas. The patient is not nervous/anxious and is not hyperactive.      Blood pressure 107/81, pulse (!) 107, temperature 97.8 F (36.6 C), temperature source Oral, resp. rate 18, height 5\' 10"  (1.778 m), weight 67.1 kg, SpO2 98 %.Body mass index is 21.24 kg/m.  See MD's discharge SRA    Have you used any form of tobacco in the last 30 days? (Cigarettes, Smokeless Tobacco, Cigars, and/or Pipes): No  Has this patient used any form of tobacco in the last 30 days? (Cigarettes, Smokeless Tobacco, Cigars, and/or Pipes)  No  Blood Alcohol level:  Lab Results  Component Value Date   ETH <10 12/08/2019   ETH <10 07/31/2018    Metabolic Disorder Labs:  Lab Results  Component Value Date   HGBA1C 5.0 10/22/2019   MPG 96.8 10/22/2019   MPG 93.93 08/03/2018   Lab Results  Component Value Date   PROLACTIN 9.9 07/06/2016   PROLACTIN 14.0 07/04/2016   Lab Results  Component Value Date   CHOL 219 (H) 10/22/2019   TRIG 103 10/22/2019   HDL 61 10/22/2019   CHOLHDL 3.6 10/22/2019   VLDL 21 10/22/2019   LDLCALC 137 (H) 10/22/2019   LDLCALC 138 (H) 08/03/2018    See Psychiatric Specialty Exam and Suicide Risk Assessment completed by Attending Physician prior to discharge.  Discharge destination:  Home  Is patient on  multiple antipsychotic therapies at discharge:  No   Has Patient had three or more failed trials of antipsychotic monotherapy by history:  No  Recommended Plan for Multiple Antipsychotic Therapies: NA  Discharge Instructions    Diet - low sodium heart healthy   Complete by: As directed    Increase activity slowly   Complete by: As directed      Allergies as of 12/12/2019   No Known Allergies     Medication List    TAKE these medications     Indication  benztropine 0.5 MG tablet Commonly known as: COGENTIN Take 1 tablet (0.5 mg total) by mouth 2 (two) times daily.  Indication: Extrapyramidal Reaction caused by Medications   haloperidol 10 MG tablet Commonly known as: HALDOL Take 1 tablet (10 mg total) by mouth at bedtime.   Indication: Psychosis   haloperidol 5 MG tablet Commonly known as: HALDOL Take 1 tablet (5 mg total) by mouth daily. Start taking on: December 13, 2019  Indication: Psychosis   traZODone 100 MG tablet Commonly known as: DESYREL Take 1 tablet (100 mg total) by mouth at bedtime as needed for sleep.  Indication: Trouble Sleeping       Follow-up Information    [facility] Go on 12/20/2019.   Specialty: Behavioral Health Why: You have an appointment for 12/20/19 at 8:00 am for therapy.  You also have an appointment for medication management on 12/28/19 at 9:30 am.  These appointments will be held in person.  Contact information: [address] [place] [address] [phone]       [facility] Follow up.   Specialty: Professional Counselor Why: You may go to this provider during their walk in hours of Monday through Friday, 8:30 am to 12:00 pm and 1:00 pm to 2:30 pm to establish care for medication management and therapy services. Contact information: [address] [place] [address] [phone]               Follow-up recommendations: Activity as tolerated. Diet as recommended by primary care physician. Keep all scheduled follow-up appointments as recommended.   Comments:   Patient is instructed to take all prescribed medications as recommended. Report any side effects or adverse reactions to your outpatient psychiatrist. Patient is instructed to abstain from alcohol and illegal drugs while on prescription medications. In the event of worsening symptoms, patient is instructed to call the crisis hotline, 911, or go to the nearest emergency department for evaluation and treatment.  Signed: Aldean Baker, NP 12/12/2019, 4:16 PM

## 2019-12-12 NOTE — Progress Notes (Signed)
  [facility] Adult Case Management Discharge Plan :  Will you be returning to the same living situation after discharge:  Yes,  to aunt. At discharge, do you have transportation home?: No. Bus pass provided.  Do you have the ability to pay for your medications: No. Samples provided.  Release of information consent forms completed and in the chart;  Patient's signature needed at discharge.  Patient to Follow up at:  Follow-up Information    [facility] Follow up.   Specialty: Behavioral Health Why: for medication management and therapy services  Contact information: [address] [place] [address] [phone]       [facility] Follow up.   Specialty: Professional Counselor Why: You may go to this provider during their walk in hours of Monday through Friday 8:30 am to 12:00 pm and 1:00 pm to 2:30 pm for medication management and therapy services. Contact information: [address] [place] [address] [phone]               Next level of care provider has access to [place] Link:no  Safety Planning and Suicide Prevention discussed: Yes,  with patient.  Have you used any form of tobacco in the last 30 days? (Cigarettes, Smokeless Tobacco, Cigars, and/or Pipes): No  Has patient been referred to the Quitline?: N/A patient is not a smoker  Patient has been referred for addiction treatment: Pt. refused referral  Otelia Santee, LCSWA 12/12/2019, 11:05 AM

## 2019-12-20 ENCOUNTER — Ambulatory Visit (HOSPITAL_COMMUNITY): Payer: Federal, State, Local not specified - Other | Admitting: Licensed Clinical Social Worker

## 2019-12-28 ENCOUNTER — Encounter (HOSPITAL_COMMUNITY): Payer: Self-pay | Admitting: Psychiatry

## 2019-12-28 ENCOUNTER — Encounter (HOSPITAL_COMMUNITY): Payer: Federal, State, Local not specified - Other | Admitting: Psychiatry

## 2020-01-17 ENCOUNTER — Other Ambulatory Visit: Payer: Self-pay

## 2020-01-17 ENCOUNTER — Ambulatory Visit (HOSPITAL_COMMUNITY): Payer: Federal, State, Local not specified - Other | Admitting: Psychiatry

## 2020-02-26 ENCOUNTER — Emergency Department (HOSPITAL_COMMUNITY)
Admission: EM | Admit: 2020-02-26 | Discharge: 2020-02-28 | Disposition: A | Discharge status: Home / Self Care | Payer: Medicaid Other | Attending: Emergency Medicine | Admitting: Emergency Medicine

## 2020-02-26 DIAGNOSIS — F22 Delusional disorders: Secondary | ICD-10-CM | POA: Insufficient documentation

## 2020-02-26 DIAGNOSIS — Z87891 Personal history of nicotine dependence: Secondary | ICD-10-CM | POA: Insufficient documentation

## 2020-02-26 DIAGNOSIS — F209 Schizophrenia, unspecified: Secondary | ICD-10-CM | POA: Diagnosis present

## 2020-02-26 DIAGNOSIS — R410 Disorientation, unspecified: Secondary | ICD-10-CM | POA: Insufficient documentation

## 2020-02-26 DIAGNOSIS — Z20822 Contact with and (suspected) exposure to covid-19: Secondary | ICD-10-CM | POA: Insufficient documentation
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
## 2020-02-26 NOTE — ED Notes (Signed)
Called staffing, no sitter available.  [employer] still in triage sitting

## 2020-02-27 ENCOUNTER — Other Ambulatory Visit: Payer: Self-pay

## 2020-02-27 LAB — COMPREHENSIVE METABOLIC PANEL
ALT: 10 U/L (ref 0–44)
AST: 15 U/L (ref 15–41)
Albumin: 4.5 g/dL (ref 3.5–5.0)
Alkaline Phosphatase: 43 U/L (ref 38–126)
Anion gap: 12 (ref 5–15)
BUN: 10 mg/dL (ref 6–20)
CO2: 24 mmol/L (ref 22–32)
Calcium: 9.9 mg/dL (ref 8.9–10.3)
Chloride: 101 mmol/L (ref 98–111)
Creatinine, Ser: 1.28 mg/dL — ABNORMAL HIGH (ref 0.61–1.24)
GFR calc Af Amer: >60 mL/min (ref >60)
GFR calc non Af Amer: >60 mL/min (ref >60)
Glucose, Bld: 82 mg/dL (ref 70–99)
Potassium: 4.3 mmol/L (ref 3.5–5.1)
Sodium: 137 mmol/L (ref 135–145)
Total Bilirubin: 3 mg/dL — ABNORMAL HIGH (ref 0.3–1.2)
Total Protein: 7.3 g/dL (ref 6.5–8.1)

## 2020-02-27 LAB — CBC
HCT: 40.7 % (ref 39.0–52.0)
Hemoglobin: 14 g/dL (ref 13.0–17.0)
MCH: 31.1 pg (ref 26.0–34.0)
MCHC: 34.4 g/dL (ref 30.0–36.0)
MCV: 90.4 fL (ref 80.0–100.0)
Platelets: 102 10*3/uL — ABNORMAL LOW (ref 150–400)
RBC: 4.5 MIL/uL (ref 4.22–5.81)
RDW: 12.5 % (ref 11.5–15.5)
WBC: 4.5 10*3/uL (ref 4.0–10.5)
nRBC: 0 % (ref 0.0–0.2)

## 2020-02-27 LAB — ACETAMINOPHEN LEVEL: Acetaminophen (Tylenol), Serum: <10 ug/mL — ABNORMAL LOW (ref 10–30)

## 2020-02-27 LAB — SALICYLATE LEVEL: Salicylate Lvl: <7 mg/dL — ABNORMAL LOW (ref 7.0–30.0)

## 2020-02-27 LAB — ETHANOL: Alcohol, Ethyl (B): <10 mg/dL (ref <10)

## 2020-02-27 LAB — RESPIRATORY PANEL BY RT PCR (FLU A&B, COVID)
Influenza A by PCR: NEGATIVE
Influenza B by PCR: NEGATIVE
SARS Coronavirus 2 by RT PCR: NEGATIVE

## 2020-02-27 NOTE — Progress Notes (Signed)
CSW informed ED Nurse, Gaye Alken RN, that patient has been accepted to [facility] and can arrive in the morning (10/5).  Transportation will have to be notified.  Ladoris Gene MSW,LCSWA,LCASA Clinical Social Worker  [place]iley Disposition, CSW [phone] (cell)
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
## 2020-02-27 NOTE — BH Assessment (Signed)
Disposition- [name], NP recommends inpt psychiatric tx

## 2020-02-27 NOTE — ED Provider Notes (Signed)
[facility] EMERGENCY DEPARTMENT Provider Note   CSN: [id number] Arrival date & time: 02/26/20  2221     History Chief Complaint  Patient presents with  . Schizophrenia    Jake Neal is a 29 y.o. male with PMH/o Depression, schizophrenia brought in by [facility] Department under IVC.  Patient states that "his family told him something and now he is here."  He states that he has not been having hallucinations.  He denies any SI, HI, auditory or visual hallucinations.  Denies any alcohol or illicit drug use.  He states that he is confused as to why he is here.  He states that please told him his family made it seem like they wanted him here but he does not think that is true.  EM LEVEL 5 CAVEAT DUE TO PSYCHIATRIC CONDITION  The history is provided by a relative.       Past Medical History:  Diagnosis Date  . Anxiety   . Depression   . Medical history non-contributory   . Schizophrenia [facility])     Patient Active Problem List   Diagnosis Date Noted  . Psychosis (HCC) 12/09/2019  . Schizophrenia, paranoid type (HCC) 02/04/2019  . Chronic post-traumatic stress disorder (PTSD) 01/28/2019    No past surgical history on file.     No family history on file.  Social History   Tobacco Use  . Smoking status: Former Games developer  . Smokeless tobacco: Never Used  Substance Use Topics  . Alcohol use: No  . Drug use: Yes    Types: Marijuana    Comment: Currently denying substance use    Home Medications Prior to Admission medications   Medication Sig Start Date End Date Taking? Authorizing Provider  benztropine (COGENTIN) 0.5 MG tablet Take 1 tablet (0.5 mg total) by mouth 2 (two) times daily. 10/25/19   Aldean Baker, NP  haloperidol (HALDOL) 10 MG tablet Take 1 tablet (10 mg total) by mouth at bedtime. 12/12/19   Antonieta Pert, MD  haloperidol (HALDOL) 5 MG tablet Take 1 tablet (5 mg total) by mouth daily. 12/13/19   Antonieta Pert, MD    traZODone (DESYREL) 100 MG tablet Take 1 tablet (100 mg total) by mouth at bedtime as needed for sleep. 12/12/19   Antonieta Pert, MD    Allergies    Patient has no known allergies.  Review of Systems   Review of Systems  Unable to perform ROS: Psychiatric disorder    Physical Exam Updated Vital Signs BP 113/62 (BP Location: Left Arm)   Pulse 73   Temp 98.6 F (37 C) (Oral)   Resp 17   SpO2 97%   Physical Exam Vitals and nursing note reviewed.  Constitutional:      Appearance: Normal appearance. He is well-developed.  HENT:     Head: Normocephalic and atraumatic.  Eyes:     General: Lids are normal.     Conjunctiva/sclera: Conjunctivae normal.     Pupils: Pupils are equal, round, and reactive to light.  Cardiovascular:     Rate and Rhythm: Normal rate and regular rhythm.     Pulses: Normal pulses.     Heart sounds: Normal heart sounds. No murmur heard.  No friction rub. No gallop.   Pulmonary:     Effort: Pulmonary effort is normal.     Breath sounds: Normal breath sounds.     Comments: Lungs clear to auscultation bilaterally.  Symmetric chest rise.  No wheezing,  rales, rhonchi. Abdominal:     Palpations: Abdomen is soft. Abdomen is not rigid.     Tenderness: There is no abdominal tenderness. There is no guarding.     Comments: Abdomen is soft, non-distended, non-tender. No rigidity, No guarding. No peritoneal signs.  Musculoskeletal:        General: Normal range of motion.     Cervical back: Full passive range of motion without pain.  Skin:    General: Skin is warm and dry.     Capillary Refill: Capillary refill takes less than 2 seconds.  Neurological:     Mental Status: He is alert and oriented to person, place, and time.     Comments: Alert and oriented x 3  Psychiatric:        Attention and Perception: He does not perceive auditory or visual hallucinations.        Speech: Speech normal.        Thought Content: Thought content does not include homicidal  or suicidal ideation. Thought content does not include homicidal or suicidal plan.     Comments: Cooperative throughout exam     ED Results / Procedures / Treatments   Labs (all labs ordered are listed, but only abnormal results are displayed) Labs Reviewed  COMPREHENSIVE METABOLIC PANEL - Abnormal; Notable for the following components:      Result Value   Creatinine, Ser 1.28 (*)    Total Bilirubin 3.0 (*)    All other components within normal limits  SALICYLATE LEVEL - Abnormal; Notable for the following components:   Salicylate Lvl <7.0 (*)    All other components within normal limits  ACETAMINOPHEN LEVEL - Abnormal; Notable for the following components:   Acetaminophen (Tylenol), Serum <10 (*)    All other components within normal limits  CBC - Abnormal; Notable for the following components:   Platelets 102 (*)    All other components within normal limits  RESPIRATORY PANEL BY RT PCR (FLU A&B, COVID)  ETHANOL  RAPID URINE DRUG SCREEN, HOSP PERFORMED    EKG EKG Interpretation  Date/Time:  Monday February 27 2020 09:32:33 EDT Ventricular Rate:  97 PR Interval:  124 QRS Duration: 88 QT Interval:  324 QTC Calculation: 411 R Axis:   75 Text Interpretation: Normal sinus rhythm with sinus arrhythmia T wave abnormality, consider inferior ischemia Abnormal ECG Since last tracing Non-specific ST-t changes Confirmed by Susy Frizzle [phone]) on 02/27/2020 9:45:27 AM   Radiology No results found.  Procedures Procedures (including critical care time)  Medications Ordered in ED Medications - No data to display  ED Course  I have reviewed the triage vital signs and the nursing notes.  Pertinent labs & imaging results that were available during my care of the patient were reviewed by me and considered in my medical decision making (see chart for details).    MDM Rules/Calculators/A&P                          29 y.o. M with PMH/o schizophrenia, depression who presents  for evaluation of IVC.  Patient states that he does not know why he is here.  He states that he was told that his family said something and now he is here.  He denies any auditory or visual hallucinations.  Denies any alcohol, drug use.  Denies any SI, HI.  On initial arrival, he is afebrile, toxic appearing.  Vital signs are stable. Plan for medical clearance labs and [name]  consultation.   I discussed with his aunt, [name], who patient lives with.  She reports that in July 2021, he was admitted for behavioral issues.  At that time, he was started on medications.  She states that he ran out of his medications about 2 to 3 weeks ago.  She reports that since then, he has had increasing hallucinations, paranoia.  He told her that he was hearing voices telling him that she really was not his aunt.  She reports that last night, he got up in her face and told her he knew that she killed his mom and brothers.  Aunt states that he has not eaten in 2 days.  Aunt's husband stated that patient was concerned that there was something wrong with his food or that the government was out to get him.  He is also told his family that he knows that they are going to cut them up and told them to "go ahead and get it over with."  Get reports that over the last few weeks, this has been increasingly worse.  Aunt states she is concerned for her safety.  And husband also told the aunt earlier today that patient had made some sexual comments about having sexual thoughts about the aunt.  IVC was initiated.  I discussed this with patient.  He did not really answer my questions.  When I asked him if he thought his aunt killed somebody he said yes but he would not elaborate.  Acetaminophen, salicylate, ethanol level unremarkable.  CBC is unremarkable.  CMP shows creatinine 1.28.  Patient medically cleared.  Portions of this note were generated with Scientist, clinical (histocompatibility and immunogenetics). Dictation errors may occur despite best attempts at  proofreading.   Final Clinical Impression(s) / ED Diagnoses Final diagnoses:  Paranoia [facility])    Rx / DC Orders ED Discharge Orders    None       Maxwell Caul, PA-C 02/27/20 1513    Pollyann Savoy, MD 02/28/20 [phone]
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
## 2020-02-27 NOTE — BH Assessment (Signed)
Patient can come after 9:30am tomorrow   Patient has been accepted to [facility].  Accepting physician is Dr. Toni Amend.  Attending Physician will be Dr. Toni Amend.  Patient has been assigned to room 316, by [facility] Charge Nurse Demetria.   Call report to [phone].  Representative/Transfer Coordinator is Leylanie Woodmansee  Patient pre-admitted by [facility] Patient Access (Renita)  [facility] ER Staff Christen Bame J,LCSW) made aware of acceptance.

## 2020-02-27 NOTE — ED Notes (Signed)
Belongings inventoried and placed in locker 4  

## 2020-02-27 NOTE — BH Assessment (Signed)
Writer spoke with Baron Hamper, NP to request admission orders. Provider agreed to put in said orders ASAP.

## 2020-02-27 NOTE — Progress Notes (Signed)
CSW notified [facility] Percell Boston, RN at [facility] that patient meets inpatient critereia.  Asked AC to review for bed placement at [facility].  Ladoris Gene MSW,LCSWA,LCASA Clinical Social Worker  [place] Disposition, CSW [phone] (cell)

## 2020-02-27 NOTE — BH Assessment (Addendum)
[name] assessment with pt completed. Disposition is pending review of IVC. Requesting IVC to be FAX'ed to [phone].

## 2020-02-28 ENCOUNTER — Inpatient Hospital Stay
Admission: RE | Admit: 2020-02-28 | Discharge: 2020-03-05 | DRG: 885 | Disposition: A | Discharge status: Home / Self Care | Payer: Medicaid Other | Source: Intra-hospital | Attending: Behavioral Health | Admitting: Behavioral Health

## 2020-02-28 ENCOUNTER — Encounter: Payer: Self-pay | Admitting: Psychiatry

## 2020-02-28 ENCOUNTER — Other Ambulatory Visit: Payer: Self-pay

## 2020-02-28 DIAGNOSIS — F29 Unspecified psychosis not due to a substance or known physiological condition: Secondary | ICD-10-CM | POA: Diagnosis present

## 2020-02-28 DIAGNOSIS — F200 Paranoid schizophrenia: Secondary | ICD-10-CM | POA: Diagnosis present

## 2020-02-28 DIAGNOSIS — F209 Schizophrenia, unspecified: Secondary | ICD-10-CM | POA: Diagnosis present

## 2020-02-28 DIAGNOSIS — Z9114 Patient's other noncompliance with medication regimen: Secondary | ICD-10-CM | POA: Diagnosis not present

## 2020-02-28 DIAGNOSIS — F4312 Post-traumatic stress disorder, chronic: Secondary | ICD-10-CM | POA: Diagnosis present

## 2020-02-28 DIAGNOSIS — F4310 Post-traumatic stress disorder, unspecified: Secondary | ICD-10-CM | POA: Diagnosis present

## 2020-02-28 MED ORDER — HALOPERIDOL 5 MG PO TABS
5.0000 mg | ORAL_TABLET | Freq: Every day | ORAL | Status: DC
  Administered 2020-02-28 – 2020-03-02 (×4): 5 mg via ORAL
  Filled 2020-02-28 (×4): qty 1

## 2020-02-28 MED ORDER — HALOPERIDOL 1 MG PO TABS: 2.0000 mg | ORAL_TABLET | Freq: Three times a day (TID) | ORAL | Status: DC | PRN

## 2020-02-28 MED ORDER — ALUM & MAG HYDROXIDE-SIMETH 200-200-20 MG/5ML PO SUSP: 30.0000 mL | ORAL | Status: DC | PRN

## 2020-02-28 MED ORDER — DIPHENHYDRAMINE HCL 25 MG PO CAPS: 25.0000 mg | ORAL_CAPSULE | Freq: Three times a day (TID) | ORAL | Status: DC | PRN

## 2020-02-28 MED ORDER — TRAZODONE HCL 100 MG PO TABS
100.0000 mg | ORAL_TABLET | Freq: Every evening | ORAL | Status: DC | PRN
  Administered 2020-03-01: 100 mg via ORAL
  Filled 2020-02-28 (×2): qty 1

## 2020-02-28 MED ORDER — ACETAMINOPHEN 325 MG PO TABS: 650.0000 mg | ORAL_TABLET | Freq: Four times a day (QID) | ORAL | Status: DC | PRN

## 2020-02-28 MED ORDER — HALOPERIDOL 5 MG PO TABS
10.0000 mg | ORAL_TABLET | Freq: Every day | ORAL | Status: DC
  Administered 2020-03-01 – 2020-03-04 (×4): 10 mg via ORAL
  Filled 2020-02-28 (×5): qty 2

## 2020-02-28 MED ORDER — MAGNESIUM HYDROXIDE 400 MG/5ML PO SUSP: 30.0000 mL | Freq: Every day | ORAL | Status: DC | PRN

## 2020-02-28 MED ORDER — BENZTROPINE MESYLATE 1 MG PO TABS
0.5000 mg | ORAL_TABLET | Freq: Two times a day (BID) | ORAL | Status: DC
  Administered 2020-02-28 – 2020-03-05 (×13): 0.5 mg via ORAL
  Filled 2020-02-28 (×13): qty 1

## 2020-02-28 MED ORDER — LORAZEPAM 1 MG PO TABS: 1.0000 mg | ORAL_TABLET | Freq: Three times a day (TID) | ORAL | Status: DC | PRN
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
## 2020-02-28 NOTE — ED Notes (Signed)
[facility] here to transport pt to [facility]. Pt calm and cooperative at this time.

## 2020-02-28 NOTE — Tx Team (Signed)
Initial Treatment Plan 02/28/2020 1:53 PM ADRIAN DINOVO ALP:[id number]    PATIENT STRESSORS: Financial difficulties Medication change or noncompliance Occupational concerns   PATIENT STRENGTHS: Average or above average intelligence Communication skills Supportive family/friends   PATIENT IDENTIFIED PROBLEMS: Paranoia  Anxiety                   DISCHARGE CRITERIA:  Adequate post-discharge living arrangements Medical problems require only outpatient monitoring Verbal commitment to aftercare and medication compliance  PRELIMINARY DISCHARGE PLAN: Attend aftercare/continuing care group Return to previous living arrangement  PATIENT/FAMILY INVOLVEMENT: This treatment plan has been presented to and reviewed with the patient, [name], and/or family member,  The patient and family have been given the opportunity to ask questions and make suggestions.  Leonarda Salon, RN 02/28/2020, 1:53 PM
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
## 2020-02-29 NOTE — Progress Notes (Signed)
Recreation Therapy Notes  INPATIENT RECREATION THERAPY ASSESSMENT  Patient Details Name: Jake Neal MRN: [id number] DOB: 09-17-1990 Today's Date: 02/29/2020       Information Obtained From: Patient  Able to Participate in Assessment/Interview: Yes  Patient Presentation: Responsive  Reason for Admission (Per Patient): Active Symptoms  Patient Stressors:    Coping Skills:   Other (Comment) (Nothing)  Leisure Interests (2+):   (Nothing)  Frequency of Recreation/Participation:    Awareness of Community Resources:     [employer]:     Current Use:    If no, Barriers?:    Expressed Interest in [employer] Information:    [place] of Residence:  Film/video editor  Patient Main Form of Transportation: Walk  Patient Strengths:  N/A  Patient Identified Areas of Improvement:  N/A  Patient Goal for Hospitalization:  Get out of the hospital  Current SI (including self-harm):  No  Current HI:  No  Current AVH: No  Staff Intervention Plan: Group Attendance, Collaborate with Interdisciplinary Treatment Team  Consent to Intern Participation: N/A  Lambert Jeanty 02/29/2020, 4:16 PM

## 2020-02-29 NOTE — Plan of Care (Signed)
D: Patient denies SI/HI/AVH. Patient presents with blunted, flat affect during assessment. Patient has no physical complaints at this time. Patient is paranoid of staff. Patient refuses evening medications. States "I do not want any pills."  A: Patient was assessed by this nurse.  Q x 15 minute observation checks were completed for safety. Patient was provided with verbal education on provided medications. Patient care plan was reviewed. Patient was offered support and encouragement. Patient was encourage to attend groups, participate in unit activities and continue with plan of care.    R: Patient does not adhere with scheduled medication. Patient has no complaints of pain at this time. Patient is receptive to treatment and safety maintained on unit.    Problem: Education: Goal: Knowledge of [place] [facility] Education information/materials will improve Outcome: Not Progressing Goal: Mental status will improve Outcome: Not Progressing Goal: Verbalization of understanding the information provided will improve Outcome: Not Progressing

## 2020-02-29 NOTE — Plan of Care (Signed)
Pt rates depression and anxiety both 5/10. Pt denies SI, HI and AVH. Pt was educated on care plan and verbalizes understanding. Pt was encouraged to attend groups. Torrie Mayers RN Problem: Education: Goal: Charity fundraiser Education information/materials will improve Outcome: Progressing Goal: Emotional status will improve Outcome: Progressing Goal: Mental status will improve Outcome: Progressing Goal: Verbalization of understanding the information provided will improve Outcome: Progressing   Problem: Health Behavior/Discharge Planning: Goal: Identification of resources available to assist in meeting health care needs will improve Outcome: Progressing Goal: Compliance with treatment plan for underlying cause of condition will improve Outcome: Progressing   Problem: Activity: Goal: Will identify at least one activity in which they can participate Outcome: Not Progressing   Problem: Coping: Goal: Ability to identify and develop effective coping behavior will improve Outcome: Progressing Goal: Ability to interact with others will improve Outcome: Not Progressing Goal: Ability to use eye contact when communicating with others will improve Outcome: Progressing   Problem: Self-Concept: Goal: Will verbalize positive feelings about self Outcome: Not Progressing
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
## 2020-02-29 NOTE — Progress Notes (Signed)
D- Patient alert and oriented. Affect/mood is depressed and withdrawn. Pt denies SI, HI, AVH, and pain. Pt states he feels "the same".  Pt has been isolative to his room except for meals. Pt refused to take a shower and go to groups.   A- Scheduled medications administered to patient, per MD orders. Support and encouragement provided.  Routine safety checks conducted every 15 minutes.  Patient informed to notify staff with problems or concerns.  R- No adverse drug reactions noted. Patient contracts for safety at this time. Patient compliant with medications and treatment plan. Patient receptive, calm, and cooperative. Patient interacts well with others on the unit.  Patient remains safe at this time.  Torrie Mayers RN
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
## 2020-02-29 NOTE — Progress Notes (Signed)
Recreation Therapy Notes   Date: 02/29/2020  Time: 9:30 am   Location: Craft room     Behavioral response: N/A   Intervention Topic: Self-esteem   Discussion/Intervention: Patient did not attend group.   Clinical Observations/Feedback:  Patient did not attend group.   Diva Lemberger LRT/CTRS         Jake Neal 02/29/2020 12:21 PM 
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
## 2020-03-01 MED ORDER — ENSURE ENLIVE PO LIQD: 237.0000 mL | Freq: Two times a day (BID) | ORAL | Status: DC
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
## 2020-03-01 NOTE — Plan of Care (Signed)
Patient is guarded and paranoid.Patient did not eat breakfast and dinner.States " I am not hungry. Why should I force myself to eat." Patient ate some from lunch tray.Patient stayed in his room.Refused to maintain personal hygiene states " I am not doing that here." Patient denies SI,HI and AVH.Patient states " I am alright." Compliant with medications.Patient drinks fluids.Refused Ensure.Support and encouragement given.

## 2020-03-01 NOTE — Progress Notes (Signed)
Patient alert and oriented x 2 with periods of confusion to time and situation, patient's affect is blunted thoughts are disorganized, he isolates to self not interacting appropriately with peers. Patient currently denies SI/HI/AVH, he appears responding to internal stimuli, he looks disheveled, poor grooming and has a body odor, staff encouraged to take a birth but he refused. Patient was offered emotional support and encouragement, 15 minutes safety checks maintained, will continue to monitor.

## 2020-03-02 MED ORDER — HALOPERIDOL 5 MG PO TABS
10.0000 mg | ORAL_TABLET | Freq: Every day | ORAL | Status: DC
  Administered 2020-03-03 – 2020-03-05 (×3): 10 mg via ORAL
  Filled 2020-03-02 (×2): qty 2

## 2020-03-02 NOTE — Progress Notes (Signed)
Patient remains guarded and bizarre on approach. He was medication compliant although. Pacing the hallway most of the evening. He had minimal interaction with peers and staff. He seemed to sleep well through out the night.

## 2020-03-02 NOTE — Tx Team (Addendum)
Interdisciplinary Treatment and Diagnostic Plan Update  03/02/2020 Time of Session: 9:00AM TORENCE Neal MRN: [id number]  Principal Diagnosis: Schizophrenia Jake Neal [facility])  Secondary Diagnoses: Principal Problem:   Schizophrenia (HCC) Active Problems:   Chronic post-traumatic stress disorder (PTSD)   Psychosis (HCC)   Current Medications:  Current Facility-Administered Medications  Medication Dose Route Frequency Provider Last Rate Last Admin  . acetaminophen (TYLENOL) tablet 650 mg  650 mg Oral Q6H PRN Clapacs, John T, MD      . alum & mag hydroxide-simeth (MAALOX/MYLANTA) 200-200-20 MG/5ML suspension 30 mL  30 mL Oral Q4H PRN Clapacs, John T, MD      . benztropine (COGENTIN) tablet 0.5 mg  0.5 mg Oral BID Clapacs, John T, MD   0.5 mg at 03/02/20 0909  . haloperidol (HALDOL) tablet 2 mg  2 mg Oral Q8H PRN Jesse Sans, MD       And  . LORazepam (ATIVAN) tablet 1 mg  1 mg Oral Q8H PRN Jesse Sans, MD       And  . diphenhydrAMINE (BENADRYL) capsule 25 mg  25 mg Oral Q8H PRN Jesse Sans, MD      . feeding supplement (ENSURE ENLIVE) (ENSURE ENLIVE) liquid 237 mL  237 mL Oral BID BM Jesse Sans, MD      . haloperidol (HALDOL) tablet 10 mg  10 mg Oral QHS Clapacs, Jackquline Denmark, MD   10 mg at 03/01/20 2115  . [START ON 03/03/2020] haloperidol (HALDOL) tablet 10 mg  10 mg Oral Daily Jesse Sans, MD      . magnesium hydroxide (MILK OF MAGNESIA) suspension 30 mL  30 mL Oral Daily PRN Clapacs, John T, MD      . traZODone (DESYREL) tablet 100 mg  100 mg Oral QHS PRN Clapacs, Jackquline Denmark, MD   100 mg at 03/01/20 2115   PTA Medications: Medications Prior to Admission  Medication Sig Dispense Refill Last Dose  . benztropine (COGENTIN) 0.5 MG tablet Take 1 tablet (0.5 mg total) by mouth 2 (two) times daily. (Patient not taking: Reported on 02/27/2020) 60 tablet 0   . haloperidol (HALDOL) 10 MG tablet Take 1 tablet (10 mg total) by mouth at bedtime. (Patient not taking: Reported on 02/27/2020)  30 tablet 0   . haloperidol (HALDOL) 5 MG tablet Take 1 tablet (5 mg total) by mouth daily. (Patient not taking: Reported on 02/27/2020) 30 tablet 0   . traZODone (DESYREL) 100 MG tablet Take 1 tablet (100 mg total) by mouth at bedtime as needed for sleep. 20 tablet 0     Patient Stressors: Financial difficulties Medication change or noncompliance Occupational concerns  Patient Strengths: Average or above average intelligence Communication skills Supportive family/friends  Treatment Modalities: Medication Management, Group therapy, Case management,  1 to 1 session with clinician, Psychoeducation, Recreational therapy.   Physician Treatment Plan for Primary Diagnosis: Schizophrenia (HCC) Long Term Goal(s): Improvement in symptoms so as ready for discharge Improvement in symptoms so as ready for discharge   Short Term Goals: Ability to identify changes in lifestyle to reduce recurrence of condition will improve Ability to verbalize feelings will improve Ability to identify and develop effective coping behaviors will improve Ability to maintain clinical measurements within normal limits will improve Compliance with prescribed medications will improve Ability to identify changes in lifestyle to reduce recurrence of condition will improve Ability to verbalize feelings will improve Ability to identify and develop effective coping behaviors will improve Compliance with prescribed medications will  improve  Medication Management: Evaluate patient's response, side effects, and tolerance of medication regimen.  Therapeutic Interventions: 1 to 1 sessions, Unit Group sessions and Medication administration.  Evaluation of Outcomes: Not Progressing  Physician Treatment Plan for Secondary Diagnosis: Principal Problem:   Schizophrenia (HCC) Active Problems:   Chronic post-traumatic stress disorder (PTSD)   Psychosis (HCC)  Long Term Goal(s): Improvement in symptoms so as ready for  discharge Improvement in symptoms so as ready for discharge   Short Term Goals: Ability to identify changes in lifestyle to reduce recurrence of condition will improve Ability to verbalize feelings will improve Ability to identify and develop effective coping behaviors will improve Ability to maintain clinical measurements within normal limits will improve Compliance with prescribed medications will improve Ability to identify changes in lifestyle to reduce recurrence of condition will improve Ability to verbalize feelings will improve Ability to identify and develop effective coping behaviors will improve Compliance with prescribed medications will improve     Medication Management: Evaluate patient's response, side effects, and tolerance of medication regimen.  Therapeutic Interventions: 1 to 1 sessions, Unit Group sessions and Medication administration.  Evaluation of Outcomes: Not Progressing   RN Treatment Plan for Primary Diagnosis: Schizophrenia (HCC) Long Term Goal(s): Knowledge of disease and therapeutic regimen to maintain health will improve  Short Term Goals: Ability to demonstrate self-control, Ability to participate in decision making will improve, Ability to verbalize feelings will improve, Ability to disclose and discuss suicidal ideas, Ability to identify and develop effective coping behaviors will improve and Compliance with prescribed medications will improve  Medication Management: RN will administer medications as ordered by provider, will assess and evaluate patient's response and provide education to patient for prescribed medication. RN will report any adverse and/or side effects to prescribing provider.  Therapeutic Interventions: 1 on 1 counseling sessions, Psychoeducation, Medication administration, Evaluate responses to treatment, Monitor vital signs and CBGs as ordered, Perform/monitor CIWA, COWS, AIMS and Fall Risk screenings as ordered, Perform wound care  treatments as ordered.  Evaluation of Outcomes: Not Progressing   LCSW Treatment Plan for Primary Diagnosis: Schizophrenia (HCC) Long Term Goal(s): Safe transition to appropriate next level of care at discharge, Engage patient in therapeutic group addressing interpersonal concerns.  Short Term Goals: Engage patient in aftercare planning with referrals and resources, Increase social support, Increase ability to appropriately verbalize feelings, Increase emotional regulation, Facilitate acceptance of mental health diagnosis and concerns and Increase skills for wellness and recovery  Therapeutic Interventions: Assess for all discharge needs, 1 to 1 time with Social worker, Explore available resources and support systems, Assess for adequacy in community support network, Educate family and significant other(s) on suicide prevention, Complete Psychosocial Assessment, Interpersonal group therapy.  Evaluation of Outcomes: Not Progressing   Progress in Treatment: Attending groups: No. Participating in groups: No. Taking medication as prescribed: Yes. Toleration medication: Yes. Family/Significant other contact made: Yes, individual(s) contacted:  SPE completed wiht the patinet's aunt.  Patient understands diagnosis: Yes. Discussing patient identified problems/goals with staff: Yes. Medical problems stabilized or resolved: Yes. Denies suicidal/homicidal ideation: Yes. Issues/concerns per patient self-inventory: No. Other: none  New problem(s) identified: No, Describe:  none  New Short Term/Long Term Goal(s): detox, elimination of symptoms of psychosis, medication management for mood stabilization; elimination of SI thoughts; development of comprehensive mental wellness/sobriety plan.  Patient Goals:  "I'm just ready to go"  Discharge Plan or Barriers: Pt reports plans to return to his aunt's home. He reports plans to follow up with the [facility].  Reason for Continuation of Hospitalization:  Anxiety Depression Hallucinations Medication stabilization  Estimated Length of Stay:  1-7 days   Recreational Therapy: Patient Stressors: N/A Patient Goal: Patient will engage in groups without prompting or encouragement from LRT x3 group sessions within 5 recreation therapy group sessions.  Attendees: Patient: Jake Neal 03/02/2020 12:50 PM  Physician: Dr. Neale Burly, MD 03/02/2020 12:50 PM  Nursing: Torrie Mayers, RN 03/02/2020 12:50 PM  RN Care Manager: 03/02/2020 12:50 PM  Social Worker: Penni Homans, LCSW 03/02/2020 12:50 PM  Recreational Therapist: Hilbert Bible, LRT 03/02/2020 12:50 PM  Other:  03/02/2020 12:50 PM  Other:  03/02/2020 12:50 PM  Other: 03/02/2020 12:50 PM    Scribe for Treatment Team: Harden Mo, LCSW 03/02/2020 12:50 PM

## 2020-03-02 NOTE — Plan of Care (Signed)
D- Patient alert and oriented. Patient presented in a pleasant mood on assessment stating that he slept ok last night and had no complaints to voice to this Clinical research associate. Patient endorsed both depression and anxiety, but stated that "I don't even want to speak on it". Patient denies SI, HI, AVH, and pain at this time. Patient has been isolative to his room except for meals and medication. He has not tended to his hygiene, even though staff has encouraged him to do so. Patient had no stated goals for today, however, he does report that he is "just ready to go".  A- Scheduled medications administered to patient, per MD orders. Support and encouragement provided.  Routine safety checks conducted every 15 minutes.  Patient informed to notify staff with problems or concerns.  R- No adverse drug reactions noted. Patient contracts for safety at this time. Patient compliant with medications. Patient receptive, calm, and cooperative. Patient remains safe at this time.  Problem: Education: Goal: Knowledge of [facility] Education information/materials will improve Outcome: Not Progressing Goal: Emotional status will improve Outcome: Not Progressing Goal: Mental status will improve Outcome: Not Progressing Goal: Verbalization of understanding the information provided will improve Outcome: Not Progressing   Problem: Health Behavior/Discharge Planning: Goal: Identification of resources available to assist in meeting health care needs will improve Outcome: Not Progressing Goal: Compliance with treatment plan for underlying cause of condition will improve Outcome: Not Progressing   Problem: Activity: Goal: Will identify at least one activity in which they can participate Outcome: Not Progressing   Problem: Coping: Goal: Ability to identify and develop effective coping behavior will improve Outcome: Not Progressing Goal: Ability to interact with others will improve Outcome: Not Progressing Goal:  Ability to use eye contact when communicating with others will improve Outcome: Not Progressing   Problem: Self-Concept: Goal: Will verbalize positive feelings about self Outcome: Not Progressing

## 2020-03-02 NOTE — Progress Notes (Signed)
Recreation Therapy Notes  Date: 03/02/2020  Time: 9:30 am   Location: Craft room     Behavioral response: N/A   Intervention Topic: Communication    Discussion/Intervention: Patient did not attend group.   Clinical Observations/Feedback:  Patient did not attend group.   Brighten Buzzelli LRT/CTRS        Keilyn Haggard 03/02/2020 2:36 PM
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
## 2020-03-03 DIAGNOSIS — F200 Paranoid schizophrenia: Secondary | ICD-10-CM

## 2020-03-03 NOTE — [facility] Counselor (Signed)
[facility] Group Notes: (Clinical Social Work)   03/03/2020      Type of Therapy:  Group Therapy   Participation Level:  Did Not Attend - was invited individually by Nurse/MHT and chose not to attend.   Shamicka Inga, LCSWA 03/03/2020  3:10 PM      

## 2020-03-03 NOTE — Progress Notes (Signed)
Patient got up for dinner and stayed around for a few minutes after dinner. Ate about 75 % of his lunch and dinner (prefers a cheeseburger for each meal).  Patient returned to his room and currently in bed. Continues to resist against taking a shower saying "I will not take a shower, period". He remains guarded and suspicious. No aggressive behaviors noted. Staff continue to provide support and encouragements. Safety monitored as expected.

## 2020-03-03 NOTE — Plan of Care (Signed)
  Problem: Education: Goal: Knowledge of [facility] Education information/materials will improve Outcome: Not Progressing Goal: Emotional status will improve Outcome: Not Progressing Goal: Mental status will improve Outcome: Not Progressing Goal: Verbalization of understanding the information provided will improve Outcome: Not Progressing   Problem: Health Behavior/Discharge Planning: Goal: Identification of resources available to assist in meeting health care needs will improve Outcome: Not Progressing Goal: Compliance with treatment plan for underlying cause of condition will improve Outcome: Not Progressing   Problem: Activity: Goal: Will identify at least one activity in which they can participate Outcome: Not Progressing   Problem: Coping: Goal: Ability to identify and develop effective coping behavior will improve Outcome: Not Progressing Goal: Ability to interact with others will improve Outcome: Not Progressing Goal: Ability to use eye contact when communicating with others will improve Outcome: Not Progressing   Problem: Self-Concept: Goal: Will verbalize positive feelings about self Outcome: Not Progressing

## 2020-03-03 NOTE — Plan of Care (Signed)
Has stayed in bed. Out of his room only for medications and meals.  Guarded and suspicious. Has poor hygiene and becomes agitated when asked to shower. Disheveled with body odor. Patient refused breakfast. Refused his ensure. Patient got up for AM medications and for lunch. Returned to bed after lunch and has been sleeping. Safety precautions reinforced.

## 2020-03-03 NOTE — Progress Notes (Signed)
Patient is quiet and reserved but will engage when approached.  He denies SI HI AVH anxiety and pain at this encounter.  He does endorse having some depression but declined to rate when asked. He is med compliant and tolerated his medication without incident.  He has been isolative to his room this evening and has not been seen engaging with others.  He is safe on the unit with 15 minute safety checks. He was advised to contact staff with any questions or concerns.       Cleo Butler-Nicholson, LPN

## 2020-03-03 NOTE — Progress Notes (Signed)
[facility] MD Progress Note  03/03/2020 1:50 PM Jake Neal  MRN:  [id number]  Principal Problem: Schizophrenia [facility]) Diagnosis: Principal Problem:   Schizophrenia (HCC) Active Problems:   Chronic post-traumatic stress disorder (PTSD)   Psychosis (HCC)  Jake Neal is a 29 y.o. male who presents to the [facility] unit for treatment of psychosis.   Interval History Patient was seen today for re-evaluation.  Nursing reports no events overnight. Patient has been medication compliant.    Subjective:  On assessment patient reports "I am fine". Reports "okay" mood, denies suicidal or homicidal ideas. When asked about shower -  "I do not say yes or no. I don`t want to talk". He reports he "not feeling really safe" in the hospital, refused to explain. Reports "I don`t really feel safe anywhere".   Total Time spent with patient: 15 minutes  Past Psychiatric History: seeH&P  Past Medical History:  Past Medical History:  Diagnosis Date  . Anxiety   . Depression   . Medical history non-contributory   . Schizophrenia (HCC)    History reviewed. No pertinent surgical history. Family History: History reviewed. No pertinent family history. Family Psychiatric  History: see H&P Social History:  Social History   Substance and Sexual Activity  Alcohol Use No     Social History   Substance and Sexual Activity  Drug Use Yes  . Types: Marijuana   Comment: Currently denying substance use    Social History   Socioeconomic History  . Marital status: Single    Spouse name: Not on file  . Number of children: Not on file  . Years of education: Not on file  . Highest education level: Not on file  Occupational History  . Not on file  Tobacco Use  . Smoking status: Former Games developer  . Smokeless tobacco: Never Used  Substance and Sexual Activity  . Alcohol use: No  . Drug use: Yes    Types: Marijuana    Comment: Currently denying substance use  . Sexual activity: Yes    Birth control/protection: None   Other Topics Concern  . Not on file  Social History Narrative  . Not on file   Social Determinants of Health   Financial Resource Strain:   . Difficulty of Paying Living Expenses: Not on file  Food Insecurity:   . Worried About Programme researcher, broadcasting/film/video in the Last Year: Not on file  . Ran Out of Food in the Last Year: Not on file  Transportation Needs:   . Lack of Transportation (Medical): Not on file  . Lack of Transportation (Non-Medical): Not on file  Physical Activity:   . Days of Exercise per Week: Not on file  . Minutes of Exercise per Session: Not on file  Stress:   . Feeling of Stress : Not on file  Social Connections:   . Frequency of Communication with Friends and Family: Not on file  . Frequency of Social Gatherings with Friends and Family: Not on file  . Attends Religious Services: Not on file  . Active Member of Clubs or Organizations: Not on file  . Attends Banker Meetings: Not on file  . Marital Status: Not on file   Additional Social History:                         Sleep: fair   Appetite:  Poor  Current Medications: Current Facility-Administered Medications  Medication Dose Route Frequency Provider Last Rate Last Admin  .  acetaminophen (TYLENOL) tablet 650 mg  650 mg Oral Q6H PRN Clapacs, John T, MD      . alum & mag hydroxide-simeth (MAALOX/MYLANTA) 200-200-20 MG/5ML suspension 30 mL  30 mL Oral Q4H PRN Clapacs, John T, MD      . benztropine (COGENTIN) tablet 0.5 mg  0.5 mg Oral BID Clapacs, John T, MD   0.5 mg at 03/03/20 0853  . haloperidol (HALDOL) tablet 2 mg  2 mg Oral Q8H PRN Jesse Sans, MD       And  . LORazepam (ATIVAN) tablet 1 mg  1 mg Oral Q8H PRN Jesse Sans, MD       And  . diphenhydrAMINE (BENADRYL) capsule 25 mg  25 mg Oral Q8H PRN Jesse Sans, MD      . feeding supplement (ENSURE ENLIVE) (ENSURE ENLIVE) liquid 237 mL  237 mL Oral BID BM Jesse Sans, MD      . haloperidol (HALDOL) tablet 10 mg   10 mg Oral QHS Clapacs, Jackquline Denmark, MD   10 mg at 03/02/20 2237  . haloperidol (HALDOL) tablet 10 mg  10 mg Oral Daily Jesse Sans, MD   10 mg at 03/03/20 4562  . magnesium hydroxide (MILK OF MAGNESIA) suspension 30 mL  30 mL Oral Daily PRN Clapacs, John T, MD      . traZODone (DESYREL) tablet 100 mg  100 mg Oral QHS PRN Clapacs, Jackquline Denmark, MD   100 mg at 03/01/20 2115    Lab Results: No results found for this or any previous visit (from the past 48 hour(s)).  Blood Alcohol level:  Lab Results  Component Value Date   ETH <10 02/26/2020   ETH <10 12/08/2019    Metabolic Disorder Labs: Lab Results  Component Value Date   HGBA1C 5.0 10/22/2019   MPG 96.8 10/22/2019   MPG 93.93 08/03/2018   Lab Results  Component Value Date   PROLACTIN 9.9 07/06/2016   PROLACTIN 14.0 07/04/2016   Lab Results  Component Value Date   CHOL 219 (H) 10/22/2019   TRIG 103 10/22/2019   HDL 61 10/22/2019   CHOLHDL 3.6 10/22/2019   VLDL 21 10/22/2019   LDLCALC 137 (H) 10/22/2019   LDLCALC 138 (H) 08/03/2018    Physical Findings: AIMS:  , ,  ,  ,    CIWA:    COWS:     Musculoskeletal: Strength & Muscle Tone: within normal limits Gait & Station: normal Patient leans: N/A  Psychiatric Specialty Exam: Physical Exam  Review of Systems  Blood pressure 108/79, pulse 62, temperature 98.4 F (36.9 C), temperature source Oral, resp. rate 18, height 6' (1.829 m), weight 66.7 kg, SpO2 100 %.Body mass index is 19.94 kg/m.  General Appearance: Disheveled  Eye Contact:  Minimal  Speech:  Garbled  Volume:  Decreased  Mood:  Euthymic  Affect:  Constricted  Thought Process:  Coherent  Orientation:  Full (Time, Place, and Person)  Thought Content:  Illogical, Delusions and Paranoid Ideation  Suicidal Thoughts:  No  Homicidal Thoughts:  No  Memory:  Immediate;   Fair Recent;   Fair  Judgement:  Impaired  Insight:  Lacking  Psychomotor Activity:  Normal  Concentration:  Concentration: Fair and  Attention Span: Fair  Recall:  [employer] of Knowledge:  Fair  Language:  Fair  Akathisia:  No  Handed:  Right  AIMS (if indicated):     Assets:  Communication Skills Desire for Improvement  ADL's:  Impaired  Cognition:  Impaired,  Mild  Sleep:  Number of Hours: 5.5     Treatment Plan Summary: Daily contact with patient to assess and evaluate symptoms and progress in treatment and Medication management   Patient is a 29 year old male/male with the above-stated past psychiatric history who is seen in follow-up.  Chart reviewed. Patient discussed with nursing. Patient continues to express paranoid thoughts. The dose of antipsychotic was increased last night; I will not make any medication changes today.   Plan: -continue inpatient psych admission; 15-minute checks; daily contact with patient to assess and evaluate symptoms and progress in treatment; psychoeducation.  -continue scheduled medications: . benztropine  0.5 mg Oral BID  . feeding supplement (ENSURE ENLIVE)  237 mL Oral BID BM  . haloperidol  10 mg Oral QHS  . haloperidol  10 mg Oral Daily    -continue PRN medications.  acetaminophen, alum & mag hydroxide-simeth, haloperidol **AND** LORazepam **AND** diphenhydrAMINE, magnesium hydroxide, traZODone  -Pertinent Labs: no new labs ordered today  -EKG: on 10/5 showed Qtc 411 with sinus arrhythmia. Today`s vitals are stable.    -Consults: No new consults placed since yesterday    -Disposition: TBD; patient is not ready for discharge at this time.  - I certify that the patient does need, on a daily basis, active treatment furnished directly by or requiring the supervision of inpatient psychiatric facility personnel.   Thalia Party, MD 03/03/2020, 1:50 PM

## 2020-03-04 NOTE — Plan of Care (Signed)
  Problem: Education: Goal: Knowledge of [facility] Education information/materials will improve Outcome: Progressing Goal: Emotional status will improve Outcome: Progressing Goal: Mental status will improve Outcome: Progressing Goal: Verbalization of understanding the information provided will improve Outcome: Progressing   Problem: Health Behavior/Discharge Planning: Goal: Identification of resources available to assist in meeting health care needs will improve Outcome: Progressing Goal: Compliance with treatment plan for underlying cause of condition will improve Outcome: Progressing   Problem: Activity: Goal: Will identify at least one activity in which they can participate Outcome: Progressing   Problem: Coping: Goal: Ability to identify and develop effective coping behavior will improve Outcome: Progressing Goal: Ability to interact with others will improve Outcome: Progressing Goal: Ability to use eye contact when communicating with others will improve Outcome: Progressing   Problem: Self-Concept: Goal: Will verbalize positive feelings about self Outcome: Progressing   

## 2020-03-04 NOTE — Plan of Care (Signed)
  Problem: Education: Goal: Emotional status will improve Outcome: Progressing   Problem: Education: Goal: Mental status will improve Outcome: Progressing   

## 2020-03-04 NOTE — Progress Notes (Signed)
Usually patient is isolative to his room , but today patient has been pacing the halls since start of the shift. He will engage when approached and his thoughts and speech are logical and coherent.  He denies SI HI AVH depression and anxiety at this encounter. He is med compliant and tolerated his medication without incident.  He is safe on the unit with 15 minute safety checks and encouraged to contact staff with any concerns.     Cleo Butler-Nicholson, LPN

## 2020-03-04 NOTE — Plan of Care (Signed)
  Problem: Education: Goal: Knowledge of [facility] Education information/materials will improve Outcome: Progressing Goal: Emotional status will improve Outcome: Progressing Goal: Mental status will improve Outcome: Progressing Goal: Verbalization of understanding the information provided will improve Outcome: Progressing   Problem: Health Behavior/Discharge Planning: Goal: Identification of resources available to assist in meeting health care needs will improve Outcome: Progressing Goal: Compliance with treatment plan for underlying cause of condition will improve Outcome: Progressing   Problem: Activity: Goal: Will identify at least one activity in which they can participate Outcome: Progressing   Problem: Coping: Goal: Ability to identify and develop effective coping behavior will improve Outcome: Progressing Goal: Ability to interact with others will improve Outcome: Progressing Goal: Ability to use eye contact when communicating with others will improve Outcome: Progressing   Problem: Self-Concept: Goal: Will verbalize positive feelings about self Outcome: Progressing

## 2020-03-04 NOTE — [facility] Group Notes (Signed)
[facility] Group Notes: (Clinical Social Work)   03/04/2020      Type of Therapy:  Group Therapy   Participation Level:  Did Not Attend - was invited individually by Nurse/MHT and chose not to attend.   Susa Simmonds, LCSWA 03/04/2020  4:04 PM

## 2020-03-04 NOTE — Progress Notes (Signed)
Patient alert and oriented. He has showered and changed into new set of scrubs.  He has requested and provided clean linen to replace bedding. He is med compliant and tolerated his medications without incident. He denies SI HI AVH anxiety and pain at this encounter.  He does endorse depression, but states that he is much better and able to manage his symptoms. He reports having an appointment with his mental health provider on the 13th and voiced concerns about missing his appointment. He was encouraged to speak with both the doctor and the social worker tomorrow about his  discharge plans.  For now he is safe on the unit with 15 minute safety checks and encouraged to contact staff with any concerns.     Cleo Butler-Nicholson, LPN

## 2020-03-04 NOTE — Progress Notes (Addendum)
[facility] MD Progress Note  03/04/2020 11:51 AM Jake Neal  MRN:  [id number]  Principal Problem: Schizophrenia [facility]) Diagnosis: Principal Problem:   Schizophrenia (HCC) Active Problems:   Chronic post-traumatic stress disorder (PTSD)   Psychosis (HCC)  Jake Neal is a 29 y.o. male who presents to the [facility] unit for treatment of psychosis.   Interval History Patient was seen today for re-evaluation.  Nursing reports no events overnight. Patient has been medication compliant.  Per RN report: "Usually patient is isolative to his room , but today patient has been pacing the halls since start of the shift. He will engage when approached and his thoughts and speech are logical and coherent."  Subjective:  On assessment patient reports "I feel so-so". Reports "my mood is fine", denies suicidal or homicidal ideas. He still does no want to discuss shower. He continues to answer "not really" to the questions if he feels safe in the hospital. He has some insight - admits that he was "not okay" prior to hospitalization and that "someone sent me here to fix meds". He says he is "calmer now" compare to his initial presentation. His speech is garbled still and he seems to be struggling with thoughts expression, possibly due to somewhat disorganized thought process.  Total Time spent with patient: 15 minutes  Past Psychiatric History: seeH&P  Past Medical History:  Past Medical History:  Diagnosis Date  . Anxiety   . Depression   . Medical history non-contributory   . Schizophrenia (HCC)    History reviewed. No pertinent surgical history. Family History: History reviewed. No pertinent family history. Family Psychiatric  History: see H&P Social History:  Social History   Substance and Sexual Activity  Alcohol Use No     Social History   Substance and Sexual Activity  Drug Use Yes  . Types: Marijuana   Comment: Currently denying substance use    Social History   Socioeconomic History  .  Marital status: Single    Spouse name: Not on file  . Number of children: Not on file  . Years of education: Not on file  . Highest education level: Not on file  Occupational History  . Not on file  Tobacco Use  . Smoking status: Former Games developer  . Smokeless tobacco: Never Used  Substance and Sexual Activity  . Alcohol use: No  . Drug use: Yes    Types: Marijuana    Comment: Currently denying substance use  . Sexual activity: Yes    Birth control/protection: None  Other Topics Concern  . Not on file  Social History Narrative  . Not on file   Social Determinants of Health   Financial Resource Strain:   . Difficulty of Paying Living Expenses: Not on file  Food Insecurity:   . Worried About Programme researcher, broadcasting/film/video in the Last Year: Not on file  . Ran Out of Food in the Last Year: Not on file  Transportation Needs:   . Lack of Transportation (Medical): Not on file  . Lack of Transportation (Non-Medical): Not on file  Physical Activity:   . Days of Exercise per Week: Not on file  . Minutes of Exercise per Session: Not on file  Stress:   . Feeling of Stress : Not on file  Social Connections:   . Frequency of Communication with Friends and Family: Not on file  . Frequency of Social Gatherings with Friends and Family: Not on file  . Attends Religious Services: Not on file  .  Active Member of Clubs or Organizations: Not on file  . Attends Banker Meetings: Not on file  . Marital Status: Not on file   Additional Social History:                         Sleep: fair   Appetite:  Poor  Current Medications: Current Facility-Administered Medications  Medication Dose Route Frequency Provider Last Rate Last Admin  . acetaminophen (TYLENOL) tablet 650 mg  650 mg Oral Q6H PRN Clapacs, John T, MD      . alum & mag hydroxide-simeth (MAALOX/MYLANTA) 200-200-20 MG/5ML suspension 30 mL  30 mL Oral Q4H PRN Clapacs, John T, MD      . benztropine (COGENTIN) tablet 0.5 mg   0.5 mg Oral BID Clapacs, John T, MD   0.5 mg at 03/04/20 0913  . haloperidol (HALDOL) tablet 2 mg  2 mg Oral Q8H PRN Jesse Sans, MD       And  . LORazepam (ATIVAN) tablet 1 mg  1 mg Oral Q8H PRN Jesse Sans, MD       And  . diphenhydrAMINE (BENADRYL) capsule 25 mg  25 mg Oral Q8H PRN Jesse Sans, MD      . feeding supplement (ENSURE ENLIVE) (ENSURE ENLIVE) liquid 237 mL  237 mL Oral BID BM Jesse Sans, MD      . haloperidol (HALDOL) tablet 10 mg  10 mg Oral QHS Clapacs, Jackquline Denmark, MD   10 mg at 03/03/20 2154  . haloperidol (HALDOL) tablet 10 mg  10 mg Oral Daily Jesse Sans, MD   10 mg at 03/04/20 0913  . magnesium hydroxide (MILK OF MAGNESIA) suspension 30 mL  30 mL Oral Daily PRN Clapacs, John T, MD      . traZODone (DESYREL) tablet 100 mg  100 mg Oral QHS PRN Clapacs, Jackquline Denmark, MD   100 mg at 03/01/20 2115    Lab Results: No results found for this or any previous visit (from the past 48 hour(s)).  Blood Alcohol level:  Lab Results  Component Value Date   ETH <10 02/26/2020   ETH <10 12/08/2019    Metabolic Disorder Labs: Lab Results  Component Value Date   HGBA1C 5.0 10/22/2019   MPG 96.8 10/22/2019   MPG 93.93 08/03/2018   Lab Results  Component Value Date   PROLACTIN 9.9 07/06/2016   PROLACTIN 14.0 07/04/2016   Lab Results  Component Value Date   CHOL 219 (H) 10/22/2019   TRIG 103 10/22/2019   HDL 61 10/22/2019   CHOLHDL 3.6 10/22/2019   VLDL 21 10/22/2019   LDLCALC 137 (H) 10/22/2019   LDLCALC 138 (H) 08/03/2018    Physical Findings: AIMS:  , ,  ,  ,    CIWA:    COWS:     Musculoskeletal: Strength & Muscle Tone: within normal limits Gait & Station: normal Patient leans: N/A  Psychiatric Specialty Exam: Physical Exam   Review of Systems   Blood pressure 106/76, pulse 65, temperature 98.1 F (36.7 C), temperature source Oral, resp. rate 14, height 6' (1.829 m), weight 66.7 kg, SpO2 100 %.Body mass index is 19.94 kg/m.  General  Appearance: Disheveled  Eye Contact:  Minimal  Speech:  Garbled  Volume:  Decreased  Mood:  Euthymic  Affect:  Constricted  Thought Process:  Coherent  Orientation:  Full (Time, Place, and Person)  Thought Content:  Illogical, Delusions and Paranoid Ideation  Suicidal Thoughts:  No  Homicidal Thoughts:  No  Memory:  Immediate;   Fair Recent;   Fair  Judgement:  Impaired  Insight:  Lacking  Psychomotor Activity:  Normal  Concentration:  Concentration: Fair and Attention Span: Fair  Recall:  [employer] of Knowledge:  Fair  Language:  Fair  Akathisia:  No  Handed:  Right  AIMS (if indicated):     Assets:  Communication Skills Desire for Improvement  ADL's:  Impaired  Cognition:  Impaired,  Mild  Sleep:  Number of Hours: 5     Treatment Plan Summary: Daily contact with patient to assess and evaluate symptoms and progress in treatment and Medication management   Patient is a 29 year old male with the above-stated past psychiatric history who is seen in follow-up.  Chart reviewed. Patient discussed with nursing. Patient appears less paranoid today, his thoughts not well organized though and he is not taking a shower still. The dose of antipsychotic was increased two days ago and I will not make any medication changes today hoping that the patient will show more improvement on a current dose soon.   Plan: -continue inpatient psych admission; 15-minute checks; daily contact with patient to assess and evaluate symptoms and progress in treatment; psychoeducation.  -continue scheduled medications: . benztropine  0.5 mg Oral BID  . feeding supplement (ENSURE ENLIVE)  237 mL Oral BID BM  . haloperidol  10 mg Oral QHS  . haloperidol  10 mg Oral Daily    -continue PRN medications.  acetaminophen, alum & mag hydroxide-simeth, haloperidol **AND** LORazepam **AND** diphenhydrAMINE, magnesium hydroxide, traZODone  -Pertinent Labs: no new labs ordered today  -EKG: on 10/5 showed Qtc  411 with sinus arrhythmia. Today`s vitals are stable.    -Consults: No new consults placed since yesterday    -Disposition: TBD; patient is not ready for discharge at this time.  - I certify that the patient does need, on a daily basis, active treatment furnished directly by or requiring the supervision of inpatient psychiatric facility personnel.   Thalia Party, MD 03/04/2020, 11:51 AM

## 2020-03-04 NOTE — Progress Notes (Signed)
Patient AAOX3, attended groups and complied with medications as ordered. Patient stated that his  goal is to " have better thoughts" Patient appears sad and quiet with flat effect. Patient stated that she he has to "stop thinking bad"  Staff will continue to monitor for changes in behavior/condition, monitor him for changes in behavior/ condition. Staff will provide support and encouragement.  No other signs of distress noted at this time. Patient contracted for safety              [place] NOVEL CORONAVIRUS (COVID-19) DAILY CHECK-OFF SYMPTOMS - answer yes or no to each - every day NO YES  Have you had a fever in the past 24 hours?   Fever (Temp > 37.80C / 100F) X    Have you had any of these symptoms in the past 24 hours?  New Cough   Sore Throat    Shortness of Breath   Difficulty Breathing   Unexplained Body Aches   X    Have you had any one of these symptoms in the past 24 hours not related to allergies?    Runny Nose   Nasal Congestion   Sneezing   X    If you have had runny nose, nasal congestion, sneezing in the past 24 hours, has it worsened?   X    EXPOSURES - check yes or no X    Have you traveled outside the state in the past 14 days?   X    Have you been in contact with someone with a confirmed diagnosis of COVID-19 or PUI in the past 14 days without wearing appropriate PPE?   X    Have you been living in the same home as a person with confirmed diagnosis of COVID-19 or a PUI (household contact)?     X    Have you been diagnosed with COVID-19?     X                                                                                                                             What to do next: Answered NO to all: Answered YES to anything:    Proceed with unit schedule Follow the [facility] Inpatient Flowsheet.

## 2020-03-05 ENCOUNTER — Other Ambulatory Visit: Payer: Self-pay | Admitting: Behavioral Health

## 2020-03-05 MED ORDER — HALOPERIDOL 10 MG PO TABS
10.0000 mg | ORAL_TABLET | Freq: Two times a day (BID) | ORAL | 1 refills | Status: DC
Start: 2020-03-05 — End: 2020-03-16

## 2020-03-05 MED ORDER — HALOPERIDOL 10 MG PO TABS
10.0000 mg | ORAL_TABLET | Freq: Two times a day (BID) | ORAL | 0 refills | Status: DC
Start: 2020-03-05 — End: 2020-03-05

## 2020-03-05 MED ORDER — HALOPERIDOL 10 MG PO TABS
10.0000 mg | ORAL_TABLET | Freq: Two times a day (BID) | ORAL | 1 refills | Status: DC
Start: 2020-03-05 — End: 2020-03-05

## 2020-03-05 MED ORDER — BENZTROPINE MESYLATE 0.5 MG PO TABS
0.5000 mg | ORAL_TABLET | Freq: Two times a day (BID) | ORAL | 1 refills | Status: DC
Start: 2020-03-05 — End: 2020-03-16

## 2020-03-05 MED ORDER — TRAZODONE HCL 100 MG PO TABS
100.0000 mg | ORAL_TABLET | Freq: Every evening | ORAL | 1 refills | Status: DC | PRN
Start: 2020-03-05 — End: 2020-05-24

## 2020-03-05 MED ORDER — BENZTROPINE MESYLATE 0.5 MG PO TABS
0.5000 mg | ORAL_TABLET | Freq: Two times a day (BID) | ORAL | 0 refills | Status: DC
Start: 2020-03-05 — End: 2020-03-05

## 2020-03-05 MED ORDER — TRAZODONE HCL 100 MG PO TABS
100.0000 mg | ORAL_TABLET | Freq: Every evening | ORAL | 0 refills | Status: DC | PRN
Start: 2020-03-05 — End: 2020-03-05

## 2020-03-05 MED ORDER — TRAZODONE HCL 100 MG PO TABS
100.0000 mg | ORAL_TABLET | Freq: Every evening | ORAL | 1 refills | Status: DC | PRN
Start: 2020-03-05 — End: 2020-03-05

## 2020-03-05 MED ORDER — BENZTROPINE MESYLATE 0.5 MG PO TABS
0.5000 mg | ORAL_TABLET | Freq: Two times a day (BID) | ORAL | 1 refills | Status: DC
Start: 2020-03-05 — End: 2020-03-05

## 2020-03-05 NOTE — [facility] Group Notes (Signed)
[facility] Group Notes:  (Nursing/MHT/Case Management/Adjunct)  Date:  03/05/2020  Time:  9:11 AM  Type of Therapy:  COMMUNTITY MEETING  Participation Level:  Did Not Attend    Kerrie Pleasure 03/05/2020, 9:11 AM

## 2020-03-05 NOTE — Plan of Care (Signed)
  Problem: Group Participation Goal: STG - Patient will engage in groups without prompting or encouragement from LRT x3 group sessions within 5 recreation therapy group sessions Description: STG - Patient will engage in groups without prompting or encouragement from LRT x3 group sessions within 5 recreation therapy group sessions 03/05/2020 1622 by Ernest Haber, LRT Outcome: Not Applicable [id number] by Ernest Haber, LRT Outcome: Not Met (add Reason) Note: Patient did not attend any groups.

## 2020-03-05 NOTE — [facility] Suicide Risk Assessment (Signed)
[facility] Discharge Suicide Risk Assessment   Principal Problem: Schizophrenia [facility]) Discharge Diagnoses: Principal Problem:   Schizophrenia (HCC) Active Problems:   Chronic post-traumatic stress disorder (PTSD)   Psychosis (HCC)   Total Time spent with patient: 30 minutes  Musculoskeletal: Strength & Muscle Tone: within normal limits Gait & Station: normal Patient leans: N/A  Psychiatric Specialty Exam: Review of Systems  Constitutional: Negative for activity change and fatigue.  HENT: Negative for rhinorrhea and sore throat.   Eyes: Negative for photophobia and visual disturbance.  Respiratory: Negative for chest tightness and shortness of breath.   Cardiovascular: Negative for chest pain and palpitations.  Gastrointestinal: Negative for constipation, diarrhea and nausea.  Endocrine: Negative for cold intolerance and heat intolerance.  Genitourinary: Negative for difficulty urinating and dysuria.  Musculoskeletal: Negative for arthralgias and back pain.  Skin: Negative for rash and wound.  Allergic/Immunologic: Negative for environmental allergies and food allergies.  Neurological: Negative for dizziness and headaches.  Hematological: Negative for adenopathy. Does not bruise/bleed easily.  Psychiatric/Behavioral: Negative for behavioral problems, hallucinations and suicidal ideas. The patient is not nervous/anxious.     Blood pressure 104/73, pulse 62, temperature 97.9 F (36.6 C), temperature source Oral, resp. rate 14, height 6' (1.829 m), weight 66.7 kg, SpO2 100 %.Body mass index is 19.94 kg/m.  General Appearance: Fairly Groomed  Patent attorney::  Good  Speech:  Normal Rate409  Volume:  Normal  Mood:  Euthymic  Affect:  Congruent  Thought Process:  Coherent  Orientation:  Full (Time, Place, and Person)  Thought Content:  Negative  Suicidal Thoughts:  No  Homicidal Thoughts:  No  Memory:  Immediate;   Fair Recent;   Fair Remote;   Fair  Judgement:  Fair  Insight:   Present  Psychomotor Activity:  Normal  Concentration:  Good  Recall:  [employer] of Knowledge:Fair  Language: Fair  Akathisia:  Negative  Handed:  Right  AIMS (if indicated):     Assets:  Communication Skills Housing Social Support  Sleep:  Number of Hours: 5  Cognition: WNL  ADL's:  Intact   Mental Status Per Nursing Assessment::   On Admission:  NA  Demographic Factors:  Male and Unemployed  Loss Factors: NA  Historical Factors: NA  Risk Reduction Factors:   Sense of responsibility to family, Living with another person, especially a relative, Positive social support, Positive therapeutic relationship and Positive coping skills or problem solving skills  Continued Clinical Symptoms:  Schizophrenia:   Depressive state  Cognitive Features That Contribute To Risk:  None    Suicide Risk:  Minimal: No identifiable suicidal ideation.  Patients presenting with no risk factors but with morbid ruminations; may be classified as minimal risk based on the severity of the depressive symptoms   Follow-up Information    [facility] Follow up.   Specialty: Urgent Care Contact information: [address] [place] [address] [phone]              Plan Of Care/Follow-up recommendations:  Activity:  as tolerated Diet:  regular diet  Jesse Sans, MD 03/05/2020, 9:33 AM

## 2020-03-05 NOTE — Progress Notes (Signed)
Recreation Therapy Notes   Date: 03/05/2020  Time: 9:30 am   Location: Craft room     Behavioral response: N/A   Intervention Topic: Self-care   Discussion/Intervention: Patient did not attend group.   Clinical Observations/Feedback:  Patient did not attend group.   Merrel Crabbe LRT/CTRS        Dalynn Jhaveri 03/05/2020 12:31 PM

## 2020-03-05 NOTE — BHH Counselor (Signed)
CSW attempted to contact the patients aunt  [name], aunt, [phone].  Penni Homans, MSW, LCSW 03/05/2020 2:09 PM

## 2020-03-05 NOTE — Progress Notes (Signed)
Discharge Note:  Patient denies SI/HI/ AVH at this time. Discharge instructions, AVS, prescriptions gone over with patient. Transition record and seven day supply of medication given. Patient agrees to comply with medication management, follow-up visit, and outpatient therapy. Patient belonging returned to patient. Patient questions and concerns addressed and answered. Patient ambulatory off the unit. Patient discharged to home with Auntie.

## 2020-03-05 NOTE — Progress Notes (Signed)
Recreation Therapy Notes  INPATIENT RECREATION TR PLAN  Patient Details Name: MILAN PERKINS MRN: [id number] DOB: 1991/01/13 Today's Date: 03/05/2020  Rec Therapy Plan Is patient appropriate for Therapeutic Recreation?: Yes Treatment times per week: at least 3 Estimated Length of Stay: 5-7 days TR Treatment/Interventions: Group participation (Comment)  Discharge Criteria Pt will be discharged from therapy if:: Discharged Treatment plan/goals/alternatives discussed and agreed upon by:: Patient/family  Discharge Summary Short term goals set: Patient will engage in groups without prompting or encouragement from LRT x3 group sessions within 5 recreation therapy group sessions Short term goals met: Not met Progress toward goals comments: Groups attended Reason goals not met: Patient did not attend any groups Therapeutic equipment acquired: N/A Reason patient discharged from therapy: Discharge from hospital Pt/family agrees with progress & goals achieved: Yes Date patient discharged from therapy: 03/05/20   Jocilyn Trego 03/05/2020, 4:22 PM

## 2020-03-05 NOTE — Progress Notes (Signed)
  [facility] Adult Case Management Discharge Plan :  Will you be returning to the same living situation after discharge:  Yes,  pt reports that he is retunring to his aunt. At discharge, do you have transportation home?: Yes,  CSW will assist. Do you have the ability to pay for your medications: Yes,  [facility]  Release of information consent forms completed and in the chart;  Patient's signature needed at discharge.  Patient to Follow up at:  Follow-up Information    [facility]. Go on 03/07/2020.   Specialty: Behavioral Health Why: Your appointment is scheduled for 8:20AM.  Please bring your ID and insurance card.  Contact information: [address] [place] [address] [phone]              Next level of care provider has access to [facility]:yes  Safety Planning and Suicide Prevention discussed: Yes,  SPE completed with pt and aunt.  Have you used any form of tobacco in the last 30 days? (Cigarettes, Smokeless Tobacco, Cigars, and/or Pipes): No  Has patient been referred to the Quitline?: Patient refused referral  Patient has been referred for addiction treatment: Pt. refused referral  Harden Mo, LCSW 03/05/2020, 11:01 AM

## 2020-03-05 NOTE — BHH Counselor (Signed)
CSW attempted to contact the patients aunt  [name], aunt, [phone].  Penni Homans, MSW, LCSW 03/05/2020 11:47 AM
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
## 2020-03-05 NOTE — Plan of Care (Signed)
  Problem: Health Behavior/Discharge Planning: Goal: Compliance with treatment plan for underlying cause of condition will improve Outcome: Progressing   Problem: Activity: Goal: Will identify at least one activity in which they can participate Outcome: Progressing

## 2020-03-07 ENCOUNTER — Encounter (HOSPITAL_COMMUNITY): Payer: Federal, State, Local not specified - Other | Admitting: Psychiatry

## 2020-03-16 ENCOUNTER — Other Ambulatory Visit: Payer: Self-pay

## 2020-03-16 ENCOUNTER — Encounter (HOSPITAL_COMMUNITY): Payer: Self-pay | Admitting: Physician Assistant

## 2020-03-16 ENCOUNTER — Ambulatory Visit (INDEPENDENT_AMBULATORY_CARE_PROVIDER_SITE_OTHER): Payer: Medicaid Other | Admitting: Physician Assistant

## 2020-03-16 VITALS — BP 101/68 | HR 53 | Ht 73.0 in | Wt 155.0 lb

## 2020-03-16 DIAGNOSIS — F200 Paranoid schizophrenia: Secondary | ICD-10-CM

## 2020-03-16 DIAGNOSIS — F411 Generalized anxiety disorder: Secondary | ICD-10-CM | POA: Diagnosis not present

## 2020-03-16 MED ORDER — HALOPERIDOL 5 MG PO TABS: 5.0000 mg | ORAL_TABLET | Freq: Two times a day (BID) | ORAL | 1 refills | Status: DC

## 2020-03-16 MED ORDER — HYDROXYZINE HCL 25 MG PO TABS: 25.0000 mg | ORAL_TABLET | Freq: Three times a day (TID) | ORAL | 1 refills | Status: DC | PRN

## 2020-03-16 MED ORDER — BENZTROPINE MESYLATE 0.5 MG PO TABS: 0.5000 mg | ORAL_TABLET | Freq: Two times a day (BID) | ORAL | 1 refills | Status: DC

## 2020-03-19 ENCOUNTER — Encounter (HOSPITAL_COMMUNITY): Payer: Self-pay | Admitting: Physician Assistant
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
## 2020-04-10 ENCOUNTER — Other Ambulatory Visit: Payer: Self-pay

## 2020-04-10 ENCOUNTER — Ambulatory Visit (INDEPENDENT_AMBULATORY_CARE_PROVIDER_SITE_OTHER): Payer: Medicaid Other | Admitting: Behavioral Health

## 2020-04-10 DIAGNOSIS — F200 Paranoid schizophrenia: Secondary | ICD-10-CM | POA: Diagnosis not present

## 2020-04-13 ENCOUNTER — Ambulatory Visit (INDEPENDENT_AMBULATORY_CARE_PROVIDER_SITE_OTHER): Payer: Medicaid Other | Admitting: Physician Assistant

## 2020-04-13 ENCOUNTER — Other Ambulatory Visit: Payer: Self-pay

## 2020-04-13 ENCOUNTER — Encounter (HOSPITAL_COMMUNITY): Payer: Self-pay | Admitting: Physician Assistant

## 2020-04-13 VITALS — BP 93/62 | HR 67 | Temp 98.8°F | Ht 74.0 in | Wt 162.0 lb

## 2020-04-13 DIAGNOSIS — F200 Paranoid schizophrenia: Secondary | ICD-10-CM

## 2020-04-13 DIAGNOSIS — F411 Generalized anxiety disorder: Secondary | ICD-10-CM | POA: Diagnosis not present

## 2020-04-13 MED ORDER — BENZTROPINE MESYLATE 1 MG PO TABS: 1.0000 mg | ORAL_TABLET | Freq: Two times a day (BID) | ORAL | 1 refills | Status: DC

## 2020-04-13 MED ORDER — HALOPERIDOL 5 MG PO TABS: 5.0000 mg | ORAL_TABLET | Freq: Two times a day (BID) | ORAL | 1 refills | Status: DC

## 2020-04-13 NOTE — Progress Notes (Signed)
BH MD/PA/NP OP Progress Note  04/13/2020 2:55 PM Jake Neal  MRN:  [id number]  Chief Complaint: Medication management  HPI:  Jake Neal is a 29 year old male with a past psychiatric history significant for schizophrenia and anxiety who presents to [facility] for medication management.  Patient is currently being managed on the following medications:  Haloperidol 5 mg 2 x daily Benztropine (Cogentin) 0.5 mg 2 x daily Hydroxyzine 25 mg 3 times daily as needed  Patient states things are going all right that he is doing much better.  Patient also states that his therapy sessions have been going well  And that he has been enjoying it. He states his next appointment is scheduled for December 2 at 1 PM.  Patient states that his medications have been helpful but they do make him sleepy.  He also reports that his throat often feels sore and attributes this symptom to his medication.  Patient is able to breathe in and out and denies difficulty or issues with breathing.  Patient has no other concerns regarding medications and is requesting a refill at the end of the encounter.  Patient endorses good mood with no stressors.  He reports having a good relationship with his aunt who he currently lives with.  He denies suicidal and homicidal ideations.  He further denies auditory or visual hallucinations.  Patient still expresses some sleep disturbances with intermittent waking up but denies concerns regarding his sleep at this point.  He reports that his appetite is picking back up and have at least 1 meal each day with some snacks.  Patient denies alcohol consumption and endorses tobacco use sparingly.  Patient denies illicit drug use stating that he has not done marijuana lately.   Visit Diagnosis:    ICD-10-CM   1. Schizophrenia, paranoid type (HCC)  F20.0 haloperidol (HALDOL) 5 MG tablet    benztropine (COGENTIN) 1 MG tablet  2. Anxiety state  F41.1      Past Psychiatric History: Schizophrenia, paranoid type Anxiety  Past Medical History:  Past Medical History:  Diagnosis Date  . Anxiety   . Depression   . Medical history non-contributory   . Schizophrenia (HCC)    No past surgical history on file.  Family Psychiatric History:  Unknown  Family History: No family history on file.  Social History:  Social History   Socioeconomic History  . Marital status: Single    Spouse name: Not on file  . Number of children: Not on file  . Years of education: Not on file  . Highest education level: Not on file  Occupational History  . Not on file  Tobacco Use  . Smoking status: Former Games developer  . Smokeless tobacco: Never Used  Substance and Sexual Activity  . Alcohol use: No  . Drug use: Yes    Types: Marijuana    Comment: Currently denying substance use  . Sexual activity: Yes    Birth control/protection: None  Other Topics Concern  . Not on file  Social History Narrative  . Not on file   Social Determinants of Health   Financial Resource Strain:   . Difficulty of Paying Living Expenses: Not on file  Food Insecurity:   . Worried About Programme researcher, broadcasting/film/video in the Last Year: Not on file  . Ran Out of Food in the Last Year: Not on file  Transportation Needs:   . Lack of Transportation (Medical): Not on file  . Lack of  Transportation (Non-Medical): Not on file  Physical Activity:   . Days of Exercise per Week: Not on file  . Minutes of Exercise per Session: Not on file  Stress:   . Feeling of Stress : Not on file  Social Connections:   . Frequency of Communication with Friends and Family: Not on file  . Frequency of Social Gatherings with Friends and Family: Not on file  . Attends Religious Services: Not on file  . Active Member of Clubs or Organizations: Not on file  . Attends Banker Meetings: Not on file  . Marital Status: Not on file    Allergies: No Known Allergies  Metabolic Disorder  Labs: Lab Results  Component Value Date   HGBA1C 5.0 10/22/2019   MPG 96.8 10/22/2019   MPG 93.93 08/03/2018   Lab Results  Component Value Date   PROLACTIN 9.9 07/06/2016   PROLACTIN 14.0 07/04/2016   Lab Results  Component Value Date   CHOL 219 (H) 10/22/2019   TRIG 103 10/22/2019   HDL 61 10/22/2019   CHOLHDL 3.6 10/22/2019   VLDL 21 10/22/2019   LDLCALC 137 (H) 10/22/2019   LDLCALC 138 (H) 08/03/2018   Lab Results  Component Value Date   TSH 1.564 10/22/2019   TSH 1.961 02/06/2019    Therapeutic Level Labs: No results found for: LITHIUM No results found for: VALPROATE No components found for:  CBMZ  Current Medications: Current Outpatient Medications  Medication Sig Dispense Refill  . benztropine (COGENTIN) 1 MG tablet Take 1 tablet (1 mg total) by mouth 2 (two) times daily. 60 tablet 1  . haloperidol (HALDOL) 5 MG tablet Take 1 tablet (5 mg total) by mouth 2 (two) times daily. 60 tablet 1  . hydrOXYzine (ATARAX/VISTARIL) 25 MG tablet Take 1 tablet (25 mg total) by mouth 3 (three) times daily as needed for anxiety. 90 tablet 1  . traZODone (DESYREL) 100 MG tablet Take 1 tablet (100 mg total) by mouth at bedtime as needed for sleep. 30 tablet 1   No current facility-administered medications for this visit.     Musculoskeletal: Strength & Muscle Tone: within normal limits Gait & Station: normal Patient leans: N/A  Psychiatric Specialty Exam: Review of Systems  Psychiatric/Behavioral: Positive for sleep disturbance. Negative for dysphoric mood, hallucinations and suicidal ideas. The patient is not nervous/anxious and is not hyperactive.     Blood pressure 93/62, pulse 67, temperature 98.8 F (37.1 C), temperature source Oral, height 6\' 2"  (1.88 m), weight 162 lb (73.5 kg), SpO2 99 %.Body mass index is 20.8 kg/m.  General Appearance: Fairly Groomed and Neat  Eye Contact:  Good  Speech:  Clear and Coherent and Normal Rate  Volume:  Normal  Mood:  Euthymic   Affect:  Appropriate  Thought Process:  Coherent and Descriptions of Associations: Intact  Orientation:  Full (Time, Place, and Person)  Thought Content: WDL   Suicidal Thoughts:  No  Homicidal Thoughts:  No  Memory:  Immediate;   Good Recent;   Fair Remote;   Fair  Judgement:  Good  Insight:  Fair  Psychomotor Activity:  Normal  Concentration:  Concentration: Good and Attention Span: Good  Recall:  Good  Fund of Knowledge: Good  Language: Good  Akathisia:  NA  Handed:  Right  AIMS (if indicated): not done  Assets:  Communication Skills Desire for Improvement Housing Social Support  ADL's:  Intact  Cognition: WNL  Sleep:  Fair   Screenings: AIMS  Admission (Discharged) from 02/28/2020 in [facility]) [facility] Ad[facility] (Discharged) from OP [facility] Admission (Discharged) from OP Visit from 09/17/2019 [facility] Admission (Discharged) from OP Visit from 02/04/2019 [facility]  AIMS Total Score 0 0 0 0 0    AUDIT     Admission (Discharged) from 02/28/2020 in [facility]PATIENT BEHAV[facility]AL [facility]ICINE Ad[facility] Admission (Discharged) from OP Visit from [facility] Admission (Discharged) from OP Visit from 02/04/2019 [facility] (Dis[facility]  Alcohol Use Disorder Identification Test Final Score (AUDIT) 1 0 0 2 0       Assessment and Plan:  Tavaras A. Intriago is a 29 year old male with a past psychiatric history significant for schizophrenia and anxiety who presents to [facility] for medication management.  Patient is  currently being managed on the following medications: Haloperidol 5 mg 2 x daily, Cogentin 0.5 mg 2 x daily, and Hydroxyzine 25 mg. Patient reports that the medications have been helpful. He does endorse some drowsiness associated with taking his medications. He also endorses sore throat like symptoms associated with taking the medication. Patient was recommended to increase dosage of Cogentin from 0.5 mg to 1 mg to manage sore throat like symptoms attributed to Haloperidol use. Patient was agreeable to recommendation.  1. Schizophrenia, paranoid type (HCC)  - haloperidol (HALDOL) 5 MG tablet; Take 1 tablet (5 mg total) by mouth 2 (two) times daily.  Dispense: 60 tablet; Refill: 1  - benztropine (COGENTIN) 1 MG tablet; Take 1 tablet (1 mg total) by mouth 2 (two) times daily.  Dispense: 60 tablet; Refill: 1  2. Anxiety state Patient to continue taking hydroxyzine 25 mg 3 x daily as needed for the management of his anxiety  Patient to follow up in 6 weeks  Meta Hatchet, PA 04/13/2020, 2:55 PM

## 2020-04-26 ENCOUNTER — Ambulatory Visit (INDEPENDENT_AMBULATORY_CARE_PROVIDER_SITE_OTHER): Payer: Medicaid Other | Admitting: Behavioral Health

## 2020-04-26 ENCOUNTER — Other Ambulatory Visit: Payer: Self-pay

## 2020-04-26 DIAGNOSIS — F200 Paranoid schizophrenia: Secondary | ICD-10-CM
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
## 2020-05-14 ENCOUNTER — Other Ambulatory Visit: Payer: Self-pay

## 2020-05-14 ENCOUNTER — Ambulatory Visit (INDEPENDENT_AMBULATORY_CARE_PROVIDER_SITE_OTHER): Payer: Medicaid Other | Admitting: Behavioral Health

## 2020-05-14 DIAGNOSIS — F200 Paranoid schizophrenia: Secondary | ICD-10-CM

## 2020-05-14 NOTE — Progress Notes (Signed)
   THERAPIST PROGRESS NOTE  Session Time: 1:00PM  Participation Level: Minimal  Behavioral Response: Well GroomedAlertPleasant  Type of Therapy: Individual Therapy  Treatment Goals addressed: Diagnosis: Schizophrenia  Interventions: Psychosocial Skills: ADLs  Summary: POSEY Neal is a 29 y.o. male who presents to [facility] for a scheduled individual therapy session. Clt reports still believing that his parents aren't who they say they are. He shared that he has been trying to keep himself busy by "walking, listening to music, and staying at home". Clt reports he has been compliant with his current medications. Clt denied SI/HI.   Suicidal/Homicidal: No  Therapist Response: Therapist offered encouragement and support.  Plan: Return again in 2 weeks.  Diagnosis: Axis I: Schizophrenia    Axis II: No diagnosis    Mamie Nick, Counselor 05/14/2020

## 2020-05-22 NOTE — Progress Notes (Signed)
   THERAPIST PROGRESS NOTE  Session Time: 3:00PM  Participation Level: Minimal  Behavioral Response: Fairly GroomedAlertPleasant  Type of Therapy: Individual Therapy  Treatment Goals addressed: Diagnosis: Schizophrenia  Interventions: Psychoeducation  Summary: Jake Neal is a 29 y.o. male who presents to [facility] for a scheduled individual therapy session. Clt shared that he has been "chillin at home mostly". Clt talked about still having the belief that his parents and other family members aren't really themselves. Clt then talked about having constant "bad thoughts" but refused to shared what these "bad thoughts" are. Clt reports he tries to change his thought patterns to more positive thoughts but is often unsuccessful with doing this. Clt shared that he only takes a shower 2-3 times a week. Clt reports being compliant with taking his prescribed medications. Clt denied SI/HI.   Suicidal/Homicidal: No  Therapist Response: Therapist offered support and encouragement. Therapist discussed ADLs.   Plan: Return again in 2 weeks.  Diagnosis: Axis I: Schizophrenia    Axis II: No diagnosis    Mamie Nick, Counselor 05/22/2020

## 2020-05-24 ENCOUNTER — Encounter (HOSPITAL_COMMUNITY): Payer: Self-pay | Admitting: Physician Assistant

## 2020-05-24 ENCOUNTER — Other Ambulatory Visit: Payer: Self-pay

## 2020-05-24 ENCOUNTER — Ambulatory Visit (INDEPENDENT_AMBULATORY_CARE_PROVIDER_SITE_OTHER): Payer: Medicaid Other | Admitting: Physician Assistant

## 2020-05-24 VITALS — BP 120/63 | HR 73 | Ht 74.0 in | Wt 160.0 lb

## 2020-05-24 DIAGNOSIS — F200 Paranoid schizophrenia: Secondary | ICD-10-CM

## 2020-05-24 DIAGNOSIS — F411 Generalized anxiety disorder: Secondary | ICD-10-CM

## 2020-05-24 NOTE — Progress Notes (Signed)
BH MD/PA/NP OP Progress Note  05/24/2020 2:30 PM ELIJAN GOOGE  MRN:  [id number]  Chief Complaint:  Chief Complaint    Medication Problem     HPI:  Sylus A. Rossano is a 29 year old male with a past psychiatric history significant for schizophrenia and anxiety who presents to [facility] for medication management.  Patient is currently being managed on the following medications:  Haloperidol 5 mg 2 x daily Benztropine (Cogentin) 0.5 mg 2 x daily Hydroxyzine 25 mg 3 times daily as needed  Patient reports that he has no issues or concerns with his current medication regimen.  Patient states that since being placed on his benztropine, his throat feels a little better.  Patient still endorses having a sore throat at night.  Patient is requesting refills on his Haldol.  Patient states that his mood has been all right and that his therapy sessions with his LCSW [facility] Spease, LCSW) has been very helpful.  Patient denies suicidal and homicidal ideations.  He further denies auditory or visual hallucinations.  Patient endorses fair sleep and receives on average 3 to 4 hours of sleep.  Patient states that he still finds himself staying up at night.  Patient endorses okay appetite and still has on average 1 meal per day.  Patient denies alcohol consumption and illicit drug use.  Patient endorses marijuana use sparingly.   Visit Diagnosis:    ICD-10-CM   1. Schizophrenia, paranoid type (HCC)  F20.0 haloperidol (HALDOL) 5 MG tablet    benztropine (COGENTIN) 2 MG tablet  2. Anxiety state  F41.1     Past Psychiatric History:  Schizophrenia, paranoid type Anxiety  Past Medical History:  Past Medical History:  Diagnosis Date  . Anxiety   . Depression   . Medical history non-contributory   . Schizophrenia (HCC)    No past surgical history on file.  Family Psychiatric History: Unknown  Family History: No family history on file.  Social History:   Social History   Socioeconomic History  . Marital status: Single    Spouse name: Not on file  . Number of children: Not on file  . Years of education: Not on file  . Highest education level: Not on file  Occupational History  . Not on file  Tobacco Use  . Smoking status: Former Games developer  . Smokeless tobacco: Never Used  Substance and Sexual Activity  . Alcohol use: No  . Drug use: Yes    Types: Marijuana    Comment: Currently denying substance use  . Sexual activity: Yes    Birth control/protection: None  Other Topics Concern  . Not on file  Social History Narrative  . Not on file   Social Determinants of Health   Financial Resource Strain: Not on file  Food Insecurity: Not on file  Transportation Needs: Not on file  Physical Activity: Not on file  Stress: Not on file  Social Connections: Not on file    Allergies: No Known Allergies  Metabolic Disorder Labs: Lab Results  Component Value Date   HGBA1C 5.0 10/22/2019   MPG 96.8 10/22/2019   MPG 93.93 08/03/2018   Lab Results  Component Value Date   PROLACTIN 9.9 07/06/2016   PROLACTIN 14.0 07/04/2016   Lab Results  Component Value Date   CHOL 219 (H) 10/22/2019   TRIG 103 10/22/2019   HDL 61 10/22/2019   CHOLHDL 3.6 10/22/2019   VLDL 21 10/22/2019   LDLCALC 137 (H) 10/22/2019  LDLCALC 138 (H) 08/03/2018   Lab Results  Component Value Date   TSH 1.564 10/22/2019   TSH 1.961 02/06/2019    Therapeutic Level Labs: No results found for: LITHIUM No results found for: VALPROATE No components found for:  CBMZ  Current Medications: Current Outpatient Medications  Medication Sig Dispense Refill  . hydrOXYzine (ATARAX/VISTARIL) 25 MG tablet Take 1 tablet (25 mg total) by mouth 3 (three) times daily as needed for anxiety. 90 tablet 1  . benztropine (COGENTIN) 2 MG tablet Take 1 tablet (2 mg total) by mouth 2 (two) times daily. 60 tablet 1  . haloperidol (HALDOL) 5 MG tablet Take 1 tablet (5 mg total) by  mouth 2 (two) times daily. 60 tablet 1  . QUEtiapine (SEROQUEL) 50 MG tablet Take 1 tablet (50 mg total) by mouth at bedtime. 30 tablet 0   No current facility-administered medications for this visit.     Musculoskeletal: Strength & Muscle Tone: within normal limits Gait & Station: normal Patient leans: N/A  Psychiatric Specialty Exam: Review of Systems  Psychiatric/Behavioral: Positive for sleep disturbance. Negative for decreased concentration, dysphoric mood, hallucinations, self-injury and suicidal ideas. The patient is not nervous/anxious and is not hyperactive.     Blood pressure 120/63, pulse 73, height 6\' 2"  (1.88 m), weight 160 lb (72.6 kg), SpO2 99 %.Body mass index is 20.54 kg/m.  General Appearance: Fairly Groomed and Neat  Eye Contact:  Good  Speech:  Clear and Coherent and Normal Rate  Volume:  Normal  Mood:  Euthymic  Affect:  Appropriate  Thought Process:  Coherent, Goal Directed and Descriptions of Associations: Intact  Orientation:  Full (Time, Place, and Person)  Thought Content: WDL and Logical   Suicidal Thoughts:  No  Homicidal Thoughts:  No  Memory:  Immediate;   Good Recent;   Fair Remote;   Fair  Judgement:  Good  Insight:  Fair  Psychomotor Activity:  Normal  Concentration:  Concentration: Good and Attention Span: Good  Recall:  Good  Fund of Knowledge: Good  Language: Good  Akathisia:  NA  Handed:  Right  AIMS (if indicated): not done  Assets:  Communication Skills Desire for Improvement Housing Social Support  ADL's:  Intact  Cognition: WNL  Sleep:  Fair   Screenings: AIMS   Flowsheet Row Admission (Discharged) from 02/28/2020 in [facility]PATIENT BEHAVIORAL MEDICINE Ad[facility]) [facility] Admission (Discharged) from OP Visit from [facility] Admission (Discharged) from OP Visit from 09/17/2019 in [facility]  Admission (Discharged) from OP Visit from 02/04/2019 in [facility]  AIMS Total Score 0 0 0 0 0    AUDIT   Flowsheet Row Admission (Discharged) from 02/28/2020 in [facility] INPATIENT BEHAVIORAL MEDICINE Admission (Discharged) [facility] Admission (Discharged) from OP Visit from [facility] Admission (Discharged) from OP Visit from 02/04/2019 in [facility] Admission (Discharged) from [facility]  Alcohol Use Disorder Identification Test Final Score (AUDIT) 1 0 0 2 0       Assessment and Plan:  Juandaniel A. Jeune is a 29 year old male with a past psychiatric history significant for schizophrenia and anxiety who presents to [facility] for medication management. Patient reports that he has no issues or concerns with his current medication regimen.  Patient is  still endorsing sore throat he experiences mainly at night and attributes to his use of Haldol.  Patient was recommended increasing his dosage of benztropine from 1 mg 2 times daily to 2 mg 2 times daily.  Patient was agreeable to recommendation.  Patient's medications will be E prescribed to pharmacy of choice  1. Schizophrenia, paranoid type (HCC)  - haloperidol (HALDOL) 5 MG tablet; Take 1 tablet (5 mg total) by mouth 2 (two) times daily.  Dispense: 60 tablet; Refill: 1 - benztropine (COGENTIN) 2 MG tablet; Take 1 tablet (2 mg total) by mouth 2 (two) times daily.  Dispense: 60 tablet; Refill: 1  2. Anxiety state Patient to continue taking hydroxyzine 25 mg 3 x daily as needed for the management of his anxiety  Patient to follow up in 6 weeks   Meta Hatchet, PA 06/26/2020, 12:47 PM

## 2020-06-04 ENCOUNTER — Ambulatory Visit (INDEPENDENT_AMBULATORY_CARE_PROVIDER_SITE_OTHER): Payer: Medicaid Other | Admitting: Behavioral Health

## 2020-06-04 ENCOUNTER — Encounter (HOSPITAL_COMMUNITY): Payer: Self-pay | Admitting: Emergency Medicine

## 2020-06-04 ENCOUNTER — Encounter (HOSPITAL_COMMUNITY): Payer: Self-pay | Admitting: Physician Assistant

## 2020-06-04 ENCOUNTER — Ambulatory Visit (HOSPITAL_COMMUNITY)
Admission: EM | Admit: 2020-06-04 | Discharge: 2020-06-04 | Disposition: A | Discharge status: Home / Self Care | Payer: Medicaid Other | Attending: Registered Nurse | Admitting: Registered Nurse

## 2020-06-04 ENCOUNTER — Other Ambulatory Visit: Payer: Self-pay

## 2020-06-04 DIAGNOSIS — F4310 Post-traumatic stress disorder, unspecified: Secondary | ICD-10-CM | POA: Diagnosis present

## 2020-06-04 DIAGNOSIS — F4312 Post-traumatic stress disorder, chronic: Secondary | ICD-10-CM | POA: Diagnosis present

## 2020-06-04 DIAGNOSIS — F200 Paranoid schizophrenia: Secondary | ICD-10-CM | POA: Diagnosis not present

## 2020-06-04 MED ORDER — QUETIAPINE FUMARATE 50 MG PO TABS
50.0000 mg | ORAL_TABLET | Freq: Every day | ORAL | 0 refills | Status: DC
Start: 2020-06-04 — End: 2020-06-04

## 2020-06-04 MED ORDER — BENZTROPINE MESYLATE 2 MG PO TABS: 2.0000 mg | ORAL_TABLET | Freq: Two times a day (BID) | ORAL | 1 refills | Status: DC

## 2020-06-04 MED ORDER — QUETIAPINE FUMARATE 50 MG PO TABS: 50.0000 mg | ORAL_TABLET | Freq: Every day | ORAL | Status: DC

## 2020-06-04 MED ORDER — QUETIAPINE FUMARATE 50 MG PO TABS
50.0000 mg | ORAL_TABLET | Freq: Every day | ORAL | 0 refills | Status: DC
Start: 2020-06-04 — End: 2020-07-03

## 2020-06-04 MED ORDER — HALOPERIDOL 5 MG PO TABS: 5.0000 mg | ORAL_TABLET | Freq: Two times a day (BID) | ORAL | 1 refills | Status: DC

## 2020-06-04 NOTE — Progress Notes (Signed)
   THERAPIST PROGRESS NOTE  Session Time: 2:00PM  Participation Level: Minimal  Behavioral Response: DisheveledAlertDepressed  Type of Therapy: Individual Therapy  Treatment Goals addressed: Diagnosis: Schizophrenia  Interventions: Supportive  Summary: Jake Neal is a 30 y.o. male who presents to [facility] for a scheduled individual therapy session with this Clinical research associate. Clt reported to this writer that he has been feeling "depressed for the last 3 days". He has no motivation, decreased sleep, decreased appetite, and failure to complete ADLs. Clt reported he last showered on 05/27/20 prior to bathing today. He was unable to identify any recent changes. He reports he received a phone call (voicemail) from his father but clt did not return his father's phone call. He reports he did not return his father's phone call because he does not believe he is his "real father". Clt curent lives with his aunt who he also believes isn't "her real self". He reported to this Clinical research associate that he did not feel safe at his current residence and was contemplating on sleeping outside. This Clinical research associate discussed hypothermia and the dangers of sleeping outside in freezing temperatures. Clt kept repeating to this writer, "So I can die if I sleep outside?". Clt said he no longer feels safe at his aunt's house and believes that "people" are out to harm him - "somebody after me and are going to hurt me bad". He denied these "people" being his aunt, uncle, and cousin. He was unable to identify who these "people" are.  Suicidal/Homicidal: No  Therapist Response: Therapist provided support and encouragement. Therapist asked clt if he felt he needed to be admitted inpatient and he reluctantly agreed. Therapist consulted the PA and [facility] staff to conduct an evaluation.  Plan: Return again in 2 weeks.  Diagnosis: Axis I: Schizophrenia, paranoid type    Axis II: No diagnosis    Mamie Nick, Counselor 06/04/2020

## 2020-06-04 NOTE — ED Notes (Signed)
Locker 30 

## 2020-06-04 NOTE — BH Assessment (Signed)
Comprehensive Clinical Assessment (CCA) Note  06/04/2020 [facility] [id number]   Disposition:per Jake Rankin, NP patient does not appear to have an emergency medical condition and can be discharged with resources and follow up care in outpatient services for Medication Management and Individual Therapy.  Pt.is a 30 yo male who presents voluntarily to [facility] from outpatient therapy. Pt was accompanied by Jake Neal  reporting symptoms of depression with paranoia . Pt has a history of schizophrenia and depression and says he was referred for assessment by Jake Neal . Pt reports medication compliance .Pt denies current suicidal ideation .No past attempts. Pt denies homicidal ideation/ history of violence. Pt denies auditory & visual hallucinations or but endorses paranoia. Pt believes that  his family he lives with is not his family and patient does not feel safe at home. Pt denies current stressors.  Pt lives aunt  , and supports include aunt . Pt denies a hx of abuse and trauma. Pt does not report any family mental health issues . Pt's doesn't work , his aunt is trying to help establish disability benefits . Pt has fair insight and judgment. Pt's memory is circumstantial . No legal history to report  Protective factors against suicide include good family support, no current suicidal ideation, future orientation, therapeutic relationship, no access to firearms, and no prior attempts.  Pt's OP history includes [facility] with [facility] and Jake [facility] , PA  . IP history includes [facility] at [facility] . Last admission was at 02/2020 at [facility]   Pt denies alcohol/ substance abuse.  MSE: Pt is casually dressed, alert, oriented x5 with normal speech and normal motor behavior. Eye contact is good. Pt's mood is depressed and affect is depressed and anxious. Affect is congruent with mood. Thought process is coherent and relevant. There is no indication Pt is currently responding to internal stimuli or experiencing  delusional thought content. Pt was cooperative throughout assessment.  Collateral : with patient's permission spoke to [facility] [phone]) pt. Aunt who reported pt has been compliant with taking his medications since inpatient admission back in 02/2020 when pt had increasing paranoia. [name] stated she noticed a change in his behavior about 3 days ago when he started speaking to her in an aggressive tone . [name] thinks patient's medication should be increased. When patient was taking a higher dosage of Haldol he was too sleepy but decreased his symptoms of paranoia. [name] requested a medication increase or the Haldol injection instead of oral meds. Ms. Manson Passey was notified that patient did not meet criteria for inpatient and would be discharged . She stated that she was off work at 5:00 and would come get the patient . Jake Neal informed patient of the discharge plan and patient advised that  he would rather ride the bus home . Pt stated he would text his aunt to tell her his plans.  Disposition:per Jake Found, NP patient does not appear to have an emergency medical condition and can be discharged with resources and follow up care in outpatient services for Medication Management and Individual Therapy      Chief Complaint:  Chief Complaint  Patient presents with  . Depression   Visit Diagnosis: Depression , Schizophrenia    CCA Screening, Triage and Referral (STR)  Patient Reported Information How did you hear about [place]? Legal System (Phreesia 06/04/2020)  Referral name: [name] LCSW (Phreesia 06/04/2020)  Referral phone number: [phone]   Whom do you see for routine medical problems? I  don't have a doctor (Phreesia 06/04/2020)  Practice/Facility Name: No data recorded Practice/Facility Phone Number: No data recorded Name of Contact: No data recorded Contact Number: No data recorded Contact Fax Number: No data recorded Prescriber Name: No data recorded Prescriber  Address (if known): No data recorded  What Is the Reason for Your Visit/Call Today? Depressive Symptons  (Phreesia 06/04/2020)  How Long Has This Been Causing You Problems? <Week (Phreesia 06/04/2020)  What Do You Feel Would Help You the Most Today? Assessment Only (Phreesia 06/04/2020)   Have You Recently Been in Any Inpatient Treatment (Hospital/Detox/Crisis Neal/28-Day Program)? No (Phreesia 06/04/2020)  Name/Location of Program/Hospital:[facility]  How Long Were You There? 4 days  When Were You Discharged? 12/12/2019   [facility] You [facility]? Yes (Phreesia 06/04/2020)  [facility] NA (Phreesia 06/04/2020)   Have You Recently Had Any Thoughts About Hurting Yourself? No (Phreesia 06/04/2020)  Are You Planning to Commit Suicide/Harm Yourself At This time? No (Phreesia 06/04/2020)   Have you Recently Had Thoughts About Hurting Someone Karolee Ohslse? No (Phreesia 06/04/2020)  Explanation: No data recorded  Have You Used Any Alcohol or Drugs in the Past 24 Hours? No (Phreesia 06/04/2020)  How Long Ago Did You Use Drugs or Alcohol? No data recorded What Did You Use and How Much? No data recorded  Do You Currently Have a Therapist/Psychiatrist? Yes (Phreesia 06/04/2020)  Name of Therapist/Psychiatrist: [name] (Phreesia 06/04/2020)   Have You Been Recently Discharged From Any Office Practice or Programs? No (Phreesia 06/04/2020)  Explanation of Discharge From Practice/Program: No data recorded    CCA Screening Triage Referral Assessment Type of Contact: Tele-Assessment  Is this Initial or Reassessment? Initial Assessment  Date Telepsych consult ordered in [facility]:  02/27/2020  [facility]  [facility]   Patient Reported Information Reviewed? Yes  Patient Left Without Being Seen? No data recorded Reason for Not Completing Assessment: No data recorded  Collateral Involvement: Pt's aunt/IVC petitioner  [name](Jeanelle Green, [phone])   Does Patient Have a Court Appointed Legal Guardian? No data recorded Name and Contact of Legal Guardian: Self  If Minor and Not Living with Parent(s), Who has Custody? None  Is CPS involved or ever been involved? Never  Is APS involved or ever been involved? Never   Patient Determined To Be At Risk for Harm To Self or Others Based on Review of Patient Reported Information or Presenting Complaint? No  Method: No data recorded Availability of Means: No data recorded Intent: No data recorded Notification Required: No data recorded Additional Information for Danger to Others Potential: No data recorded Additional Comments for Danger to Others Potential: No data recorded Are There Guns or Other Weapons in Your Home? No data recorded Types of Guns/Weapons: No data recorded Are These Weapons Safely Secured?                            No  Who Could Verify You Are Able To Have These Secured: No data recorded Do You Have any Outstanding Charges, Pending Court Dates, Parole/Probation? No data recorded Contacted To Inform of Risk of Harm To Self or Others: No data recorded  [facility]   Does Patient Present under Involuntary Commitment? Yes  IVC Papers Initial File Date: 12/08/2019   [place]   Patient Currently Receiving the Following Services: Not Receiving Services   Determination of Need:  Urgent (48 hours)   Options For Referral: No data recorded    CCA Biopsychosocial Intake/Chief Complaint:  depression/ paranoia  Current Symptoms/Problems: Paranoia   Patient Reported Schizophrenia/Schizoaffective Diagnosis in Past: Yes   Strengths: Per pt: "playing ball."  Preferences: No data recorded Abilities: No data recorded  Type of Services Patient Feels are Needed: Pt states 2 months ago he noticed the medications were "actually" helping to organize and slow his thinking "I am thinking everything  at once"   Initial Clinical Notes/Concerns: No data recorded  Mental Health Symptoms Depression:  None   Duration of Depressive symptoms: No data recorded  Mania:  None   Anxiety:   None   Psychosis:  None   Duration of Psychotic symptoms: No data recorded  Trauma:  N/A   Obsessions:  N/A   Compulsions:  Absent insight/delusional   Inattention:  N/A   Hyperactivity/Impulsivity:  N/A   Oppositional/Defiant Behaviors:  N/A   Emotional Irregularity:  N/A   Other Mood/Personality Symptoms:  No data recorded   Mental Status Exam Appearance and self-care  Stature:  Tall   Weight:  Average weight   Clothing:  Disheveled   Grooming:  Neglected   Cosmetic use:  None   Posture/gait:  Tense   Motor activity:  Restless   Sensorium  Attention:  Normal   Concentration:  Normal   Orientation:  X5   Recall/memory:  Normal   Affect and Mood  Affect:  Blunted   Mood:  Depressed   Relating  Eye contact:  Fleeting   Facial expression:  Tense   Attitude toward examiner:  Guarded   Thought and Language  Speech flow: Flight of Ideas   Thought content:  Appropriate to Mood and Circumstances   Preoccupation:  None   Hallucinations:  Other (Comment)   Organization:  No data recorded  [employer] of Knowledge:  Fair   Intelligence:  Below average   Abstraction:  Normal   Judgement:  Fair   Dance movement psychotherapist:  Unaware   Insight:  Fair   Decision Making:  Normal   Social Functioning  Social Maturity:  Isolates   Social Judgement:  Normal   Stress  Stressors:  Housing   Coping Ability:  Normal   Skill Deficits:  Intellect/education   Supports:  Family     Religion: Religion/Spirituality Are You A Religious Person?: No  Leisure/Recreation: Leisure / Recreation Do You Have Hobbies?: No  Exercise/Diet: Exercise/Diet Do You Exercise?: No Have You Gained or Lost A Significant Amount of Weight in the Past Six Months?:  No Do You Follow a Special Diet?: No Do You Have Any Trouble Sleeping?: No   CCA Employment/Education Employment/Work Situation: Employment / Work Psychologist, occupational Employment situation: Unemployed Has patient ever been in the [employer]?: No  Education: Education Is Patient Currently Attending School?: No Did Garment/textile technologist From [employer]?: No Did You Product manager?: No Did Designer, television/film set?: No Did You Have An Individualized Education Program (IIEP): No Did You Have Any Difficulty At [employer]?: No Patient's Education Has Been Impacted by Current Illness: No   CCA Family/Childhood History Family and Relationship History: Family history Marital status: Single Does patient have children?: No  Childhood History:  Childhood History By whom was/is the patient raised?: Mother Does patient have siblings?: No Did patient suffer any verbal/emotional/physical/sexual abuse as a child?: No Did patient suffer from severe childhood neglect?: No Has patient ever been sexually abused/assaulted/raped as an adolescent or adult?: No  Was the patient ever a victim of a crime or a disaster?: No Witnessed domestic violence?: No Has patient been affected by domestic violence as an adult?: No  Child/Adolescent Assessment:     CCA Substance Use Alcohol/Drug Use: Alcohol / Drug Use Pain Medications: SEE MAR Prescriptions: SEE MAR Over the Counter: SEE MAR History of alcohol / drug use?: No history of alcohol / drug abuse                         ASAM's:  Six Dimensions of Multidimensional Assessment  Dimension 1:  Acute Intoxication and/or Withdrawal Potential:      Dimension 2:  Biomedical Conditions and Complications:      Dimension 3:  Emotional, Behavioral, or Cognitive Conditions and Complications:     Dimension 4:  Readiness to Change:     Dimension 5:  Relapse, Continued use, or Continued Problem Potential:     Dimension 6:  Recovery/Living Environment:     ASAM  Severity Score:    ASAM Recommended Level of Treatment:     Substance use Disorder (SUD)    Recommendations for Services/Supports/Treatments:    DSM5 Diagnoses: Patient Active Problem List   Diagnosis Date Noted  . Schizophrenia (HCC) 02/28/2020  . Schizophrenia, paranoid type (HCC) 02/04/2019  . Chronic post-traumatic stress disorder (PTSD) 01/28/2019    Patient Centered Plan: Patient is on the following Treatment Plan(s):    Referrals to Alternative Service(s): Referred to Alternative Service(s):   Place:   Date:   Time:    Referred to Alternative Service(s):   Place:   Date:   Time:    Referred to Alternative Service(s):   Place:   Date:   Time:    Referred to Alternative Service(s):   Place:   Date:   Time:     Rachel Moulds, [place]

## 2020-06-04 NOTE — ED Provider Notes (Cosign Needed Addendum)
Behavioral Health Urgent Care Medical Screening Exam  Patient Name: Jake Neal MRN: [id number] Date of Evaluation: 06/04/20 Chief Complaint: Chief Complaint/Presenting Problem: depression/ paranoia Diagnosis:  Final diagnoses:  Schizophrenia, paranoid type (HCC)    History of Present illness: Jake Neal is a 30 y.o. male patient presented to [facility] as a walk in with complaints of paranoia and depression after therapy session  Charlann Neal, 30 y.o., male patient seen face to face by this provider, consulted with Dr. Bronwen Betters; and chart reviewed on 06/04/20.  On evaluation Jake Neal reports he is having some paranoia.  Patient states that he does not feel safe at home that the people in the home is supposed to be his family but he does not feel that they are.  Patient denies suicidal/homicidal ideation and also denies auditory/visual hallucinations.  Patient reported he lives with his aunt and gave permission to speak with her for collateral information Jake Neal at [phone].  Patient reports he is taking his medications as prescribed and has not missed the dose. During evaluation Antino A Wray is standing in no acute distress.  He is alert, oriented x 4, calm and cooperative.  His mood is depressed congruent affect.  He does not appear to be responding to internal/external stimuli or delusional thoughts; but he does express paranoia ideation that the people in his home is not his family..  Patient denies suicidal/self-harm/homicidal ideation, and psychosis.    Collateral Information: Spoke with patient's aunt Jake Neal who reported that patient has been taking his medication and form patient's last admission was [facility] in October when paranoia was really bad.  Reports that patient felt that everyone in a house with strangers and out to get him.  States that she has noticed some changes in the last 3 days but feels that patient's medication needs to be increased.  States  patient was taking Haldol 10 mg twice a day but was complaining of being sleepy and it was then decreased to 5 mg twice a day.  Patient is not was informed of medication changes and follow-up.  Patient reports she has a problem with patient coming home states she is willing to come pick patient up if he wants.  Patient states he wants to ride the bus.  Spoke with Jake Back, PA related to patient's medications discussed starting Seroquel 50 mg nightly and he would have patient come Neal in 2 weeks for medication management follow-up   Psychiatric Specialty Exam  Presentation  General Appearance:Appropriate for Environment; Casual  Eye Contact:Good  Speech:Clear and Coherent; Normal Rate  Speech Volume:Normal  Handedness:Right   Mood and Affect  Mood:Depressed  Affect:Restricted   Thought Process  Thought Processes:Coherent; Linear  Descriptions of Associations:Intact  Orientation:Full (Time, Place and Person)  Thought Content:Paranoid Ideation  Hallucinations:None  Ideas of Reference:None  Suicidal Thoughts:No  Homicidal Thoughts:No   Sensorium  Memory:Immediate Fair; Recent Good; Remote Fair  Judgment:Intact  Insight:Present   Executive Functions  Concentration:Fair  Attention Span:Good  Recall:Fair  Fund of Knowledge:Fair  Language:Good   Psychomotor Activity  Psychomotor Activity:Normal   Assets  Assets:Communication Skills; Desire for Improvement; Physical Health; Housing; Social Support   Sleep  Sleep:Good  Number of hours: No data recorded  Physical Exam: Physical Exam Nursing note reviewed. Vitals reviewed: None taken.  Nursing informed to take prior to discharge. Exam conducted with a chaperone present.  Constitutional:      General: He is not in acute distress.  Appearance: Normal appearance. He is normal weight. He is not ill-appearing.  HENT:     Head: Normocephalic and atraumatic.  Eyes:     Pupils: Pupils are equal,  round, and reactive to light.  Cardiovascular:     Rate and Rhythm: Normal rate and regular rhythm.  Pulmonary:     Effort: Pulmonary effort is normal.     Breath sounds: Normal breath sounds.  Musculoskeletal:        General: Normal range of motion.     Cervical Neal: Normal range of motion.  Skin:    General: Skin is warm and dry.  Neurological:     Mental Status: He is alert and oriented to person, place, and time.  Psychiatric:        Attention and Perception: Attention and perception normal. He does not perceive auditory or visual hallucinations.        Mood and Affect: Mood is depressed. Affect is flat.        Speech: Speech normal.        Behavior: Behavior normal. Behavior is cooperative.        Thought Content: Thought content is paranoid. Thought content does not include homicidal or suicidal ideation.        Cognition and Memory: Cognition and memory normal.        Judgment: Judgment is impulsive.    Review of Systems  Constitutional: Negative.   HENT: Negative.   Eyes: Negative.   Respiratory: Negative.   Cardiovascular: Negative.   Gastrointestinal: Negative.   Genitourinary: Negative.   Musculoskeletal: Negative.   Skin: Negative.   Neurological: Negative.   Endo/Heme/Allergies: Negative.   Psychiatric/Behavioral: Negative for hallucinations, substance abuse and suicidal ideas. Depression: Stable. Nervous/anxious: Stable.        Patient is endorsing paranoia   There were no vitals taken for this visit. There is no height or weight on file to calculate BMI.  Musculoskeletal: Strength & Muscle Tone: within normal limits Gait & Station: normal Patient leans: N/A   [facility] MSE Discharge Disposition for Follow up and Recommendations: Based on my evaluation the patient does not appear to have an emergency medical condition and can be discharged with resources and follow up care in outpatient services for Medication Management and Individual Therapy   Follow-up  Information    Call  [facility].   Specialty: Urgent Care Why: Will reschedule for you to follow up in 2 weeks for medication management Contact information: [address] [place] [address] [phone]              [name], NP 06/04/2020, 5:26 PM

## 2020-06-04 NOTE — ED Triage Notes (Signed)
Pt presents to [facility] reports "Not feelings safe and depression." Pt denies SI/HI and AVH.

## 2020-06-14 ENCOUNTER — Telehealth (INDEPENDENT_AMBULATORY_CARE_PROVIDER_SITE_OTHER): Payer: Medicaid Other | Admitting: Physician Assistant

## 2020-06-14 ENCOUNTER — Other Ambulatory Visit: Payer: Self-pay

## 2020-06-14 DIAGNOSIS — F411 Generalized anxiety disorder: Secondary | ICD-10-CM

## 2020-06-14 DIAGNOSIS — F200 Paranoid schizophrenia: Secondary | ICD-10-CM | POA: Diagnosis not present

## 2020-06-25 ENCOUNTER — Other Ambulatory Visit: Payer: Self-pay

## 2020-06-25 ENCOUNTER — Ambulatory Visit (INDEPENDENT_AMBULATORY_CARE_PROVIDER_SITE_OTHER): Payer: Medicaid Other | Admitting: Behavioral Health

## 2020-06-25 DIAGNOSIS — F200 Paranoid schizophrenia: Secondary | ICD-10-CM

## 2020-06-26 ENCOUNTER — Encounter (HOSPITAL_COMMUNITY): Payer: Self-pay | Admitting: Physician Assistant

## 2020-06-27 DIAGNOSIS — F411 Generalized anxiety disorder: Secondary | ICD-10-CM | POA: Insufficient documentation

## 2020-06-27 NOTE — Progress Notes (Signed)
BH MD/PA/NP OP Progress Note  Virtual Visit via Telephone Note  I connected with [name] on 06/14/2020 at  4:30 PM EST by telephone and verified that I am speaking with the correct person using two identifiers.  Location: Patient: Home Provider: Clinic   I discussed the limitations, risks, security and privacy concerns of performing an evaluation and management service by telephone and the availability of in person appointments. I also discussed with the patient that there may be a patient responsible charge related to this service. The patient expressed understanding and agreed to proceed.  Follow Up Instructions:  I discussed the assessment and treatment plan with the patient. The patient was provided an opportunity to ask questions and all were answered. The patient agreed with the plan and demonstrated an understanding of the instructions.   The patient was advised to call back or seek an in-person evaluation if the symptoms worsen or if the condition fails to improve as anticipated.  I provided 23 minutes of non-face-to-face time during this encounter.   Meta Hatchet, PA   06/14/2020 5:00 PM LOGUN COLAVITO  MRN:  [id number]  Chief Complaint: Follow up and medication management  HPI:  Maxon A. Beckford is a 30 year old male with a past psychiatric historysignificant for schizophrenia and anxiety [facility]. Patient is currently being managed on the following medications:  Haloperidol 5 mg 2 x daily Benztropine (Cogentin) 0.5 mg 2 x daily Hydroxyzine 25 mg 3 times daily as needed Seroquel 50 mg at bedtime  Patient reports that his current medication regimen has been helping him a little bit.  Patient states that he is compliant with his current regimen. Patient states that there are no issues or concerns at his home and mostly stays out of the way. Patient reports that since being  placed on Seroquel, he has been able to sleep at night. Patient has no issues or concerns with his medications at this time.  Patient denies the need for medication refills at this time.  Patient states that his mood has been all right but states that he still experiences periods of sadness.  Patient denies suicidal and homicidal ideations.  He further denies auditory or visual hallucinations.  Patient endorses fair sleep and receives on average 3 to 4 hours of sleep.  Patient states that he is able to go to sleep but he does not feel very tired at times.  Patient endorses poor appetite and eats on average one meal or snack per day.  Patient denies alcohol consumption, tobacco use, and illicit drug use.  Patient reports that the last time he used marijuana was roughly 3 weeks ago.  Visit Diagnosis:    ICD-10-CM   1. Anxiety state  F41.1   2. Schizophrenia, paranoid type (HCC)  F20.0     Past Psychiatric History:  Schizophrenia, paranoid type Anxiety  Past Medical History:  Past Medical History:  Diagnosis Date   Anxiety    Depression    Medical history non-contributory    Schizophrenia (HCC)    No past surgical history on file.  Family Psychiatric History:  Unknown  Family History: No family history on file.  Social History:  Social History   Socioeconomic History   Marital status: Single    Spouse name: Not on file   Number of children: Not on file   Years of education: Not on file   Highest education level: Not on file  Occupational  History   Not on file  Tobacco Use   Smoking status: Former Smoker   Smokeless tobacco: Never Used  Substance and Sexual Activity   Alcohol use: No   Drug use: Yes    Types: Marijuana    Comment: Currently denying substance use   Sexual activity: Yes    Birth control/protection: None  Other Topics Concern   Not on file  Social History Narrative   Not on file   Social Determinants of Health   Financial Resource  Strain: Not on file  Food Insecurity: Not on file  Transportation Needs: Not on file  Physical Activity: Not on file  Stress: Not on file  Social Connections: Not on file    Allergies: No Known Allergies  Metabolic Disorder Labs: Lab Results  Component Value Date   HGBA1C 5.0 10/22/2019   MPG 96.8 10/22/2019   MPG 93.93 08/03/2018   Lab Results  Component Value Date   PROLACTIN 9.9 07/06/2016   PROLACTIN 14.0 07/04/2016   Lab Results  Component Value Date   CHOL 219 (H) 10/22/2019   TRIG 103 10/22/2019   HDL 61 10/22/2019   CHOLHDL 3.6 10/22/2019   VLDL 21 10/22/2019   LDLCALC 137 (H) 10/22/2019   LDLCALC 138 (H) 08/03/2018   Lab Results  Component Value Date   TSH 1.564 10/22/2019   TSH 1.961 02/06/2019    Therapeutic Level Labs: No results found for: LITHIUM No results found for: VALPROATE No components found for:  CBMZ  Current Medications: Current Outpatient Medications  Medication Sig Dispense Refill   benztropine (COGENTIN) 2 MG tablet Take 1 tablet (2 mg total) by mouth 2 (two) times daily. 60 tablet 1   haloperidol (HALDOL) 5 MG tablet Take 1 tablet (5 mg total) by mouth 2 (two) times daily. 60 tablet 1   hydrOXYzine (ATARAX/VISTARIL) 25 MG tablet Take 1 tablet (25 mg total) by mouth 3 (three) times daily as needed for anxiety. 90 tablet 1   QUEtiapine (SEROQUEL) 50 MG tablet Take 1 tablet (50 mg total) by mouth at bedtime. 30 tablet 0   No current facility-administered medications for this visit.     Musculoskeletal: Strength & Muscle Tone: Unable to assess due to telemedicine visit Gait & Station: Unable to assess due to telemedicine visit Patient leans: Unable to assess due to telemedicine visit   Psychiatric Specialty Exam: Review of Systems  Psychiatric/Behavioral: Positive for sleep disturbance. Negative for decreased concentration, dysphoric mood, hallucinations, self-injury and suicidal ideas. The patient is not nervous/anxious and is  not hyperactive.     There were no vitals taken for this visit.There is no height or weight on file to calculate BMI.  General Appearance: Unable to assess due to telemedicine visit  Eye Contact:  Unable to assess due to telemedicine visit  Speech:  Clear and Coherent and Normal Rate  Volume:  Normal  Mood:  Euthymic  Affect:  Appropriate  Thought Process:  Coherent, Goal Directed and Descriptions of Associations: Intact  Orientation:  Full (Time, Place, and Person)  Thought Content: WDL   Suicidal Thoughts:  No  Homicidal Thoughts:  No  Memory:  Immediate;   Good Recent;   Fair Remote;   Fair  Judgement:  Good  Insight:  Fair  Psychomotor Activity:  Normal  Concentration:  Concentration: Good and Attention Span: Good  Recall:  Good  Fund of Knowledge: Good  Language: Good  Akathisia:  NA  Handed:  Right  AIMS (if indicated): not done  Assets:  Communication Skills Desire for Improvement Housing Social Support  ADL's:  Intact  Cognition: WNL  Sleep:  Fair   Screenings: AIMS   Flowsheet Row Admission (Discharged) [facility]28/2020 in [facility]) [facility] Ad[facility] (Discharged[facility] from [facility] Admission (Discharged) from OP Visit from [facility] Admission (Discharged) from OP Visit from 02/04/2019 [facility]  AIMS Total Score 0 0 0 0 0    AUDIT   Flowsheet Row Admission (Discharged) [facility] 02/28/2020 in [facility] BE[facility] MEDICINE [facility] (Discharged[facility] Admission (Discharged) from OP Visit [facility] Admission (Discharged) from OP Visit from 02/04/2019 [facility] ([facility]  Alcohol Use Disorder Identification Test Final Score (AUDIT) 1 0 0 2 0    PHQ2-9   Flowsheet Row ED from 06/04/2020 in [facility]  PHQ-2 Total Score 3  PHQ-9 Total Score 9       Assessment and Plan:   Clayson A. Papadakis is a 30 year old male with a past psychiatric historysignificant for schizophrenia and anxiety who [facility]. Patient reports that his medication regimen has been going well.  Patient reports that since being placed on Seroquel, he has been able to go to sleep at night.  Patient still endorses receiving roughly 3 to 4 hours of sleep at night. Patient has no concerns or issues with his current medication regimen. Patient denies medication refills at this time.  1. Anxiety state Patient to continue taking Hydroxyzine 25 mg 3 times daily as prescribed  2. Schizophrenia, paranoid type (HCC) Patient to continue taking Seroquel 50 mg at bedtimes as prescribed Patient to continue taking haloperidol 5 mg 2 times daily as prescribed Patient to continue taking benztropine 2 mg two times daily  Patient to follow up in 6 weeks  Meta Hatchet, PA 06/14/2020, 5:00 PM

## 2020-06-28 ENCOUNTER — Encounter (HOSPITAL_COMMUNITY): Payer: Self-pay | Admitting: Physician Assistant

## 2020-07-03 ENCOUNTER — Other Ambulatory Visit: Payer: Self-pay

## 2020-07-03 ENCOUNTER — Encounter (HOSPITAL_COMMUNITY): Payer: Self-pay | Admitting: Physician Assistant

## 2020-07-03 ENCOUNTER — Ambulatory Visit (INDEPENDENT_AMBULATORY_CARE_PROVIDER_SITE_OTHER): Payer: Medicaid Other | Admitting: Physician Assistant

## 2020-07-03 VITALS — BP 118/72 | HR 70 | Ht 74.0 in | Wt 160.0 lb

## 2020-07-03 DIAGNOSIS — F329 Major depressive disorder, single episode, unspecified: Secondary | ICD-10-CM | POA: Diagnosis not present

## 2020-07-03 DIAGNOSIS — F411 Generalized anxiety disorder: Secondary | ICD-10-CM

## 2020-07-03 DIAGNOSIS — F200 Paranoid schizophrenia: Secondary | ICD-10-CM

## 2020-07-03 DIAGNOSIS — F4312 Post-traumatic stress disorder, chronic: Secondary | ICD-10-CM

## 2020-07-03 DIAGNOSIS — F339 Major depressive disorder, recurrent, unspecified: Secondary | ICD-10-CM | POA: Insufficient documentation

## 2020-07-03 MED ORDER — BENZTROPINE MESYLATE 2 MG PO TABS: 2.0000 mg | ORAL_TABLET | Freq: Two times a day (BID) | ORAL | 1 refills | Status: DC

## 2020-07-03 MED ORDER — ESCITALOPRAM OXALATE 10 MG PO TABS: 10.0000 mg | ORAL_TABLET | Freq: Every day | ORAL | 1 refills | Status: DC

## 2020-07-03 MED ORDER — HALOPERIDOL 5 MG PO TABS: 5.0000 mg | ORAL_TABLET | Freq: Two times a day (BID) | ORAL | 1 refills | Status: DC

## 2020-07-03 NOTE — Progress Notes (Signed)
BH MD/PA/NP OP Progress Note  07/03/2020 7:55 PM Jake Neal  MRN:  [id number]  Chief Complaint:  Chief Complaint    Follow-up     HPI:  Jake Neal is a 30 year old male with a past psychiatric historysignificant for schizophrenia and anxiety who [facility]. Patient is currently being managed on the following medications:  Haloperidol 5 mg 2 x daily Benztropine (Cogentin) 0.5 mg 2 x daily Hydroxyzine 25 mg 3 times daily as needed Seroquel 50 mg at bedtime  Patient reports that he has been having issues with his Seroquel.  Patient reports experiencing visual disturbances that manifest as blurry vision while on Seroquel.  Patient reports that he has stopped taking Seroquel.  Patient also expresses that he believes that the people he is living with are not who they say they are.  Patient continues to report that he feels that he is applying to something going on with the people he is living with.  Patient reports that he stressed out over the situation.  When asked what triggered these thoughts, patient stated, "I heard it from people in the streets, people be talking."  Patient also endorses depressed mood lack of motivation, decreased energy, and difficulty focusing.  Patient attributes these feelings to his family members not being who they say they are.  And states that his mood is 50-50 that he does not trust anyone.  Patient denies suicidal or homicidal ideations.  He further denies auditory or visual hallucinations.  Patient reports that he did not receive any sleep the previous night but states that most nights he receives on average 5 to 6 hours of sleep.  Patient endorses decreased appetite and states that he eats roughly 1 meal per day.  Patient denies alcohol consumption and illicit drug use.  Patient denies recent tobacco use.  Visit Diagnosis:    ICD-10-CM   1. Schizophrenia, paranoid type  (HCC)  F20.0 benztropine (COGENTIN) 2 MG tablet    haloperidol (HALDOL) 5 MG tablet  2. Anxiety state  F41.1 escitalopram (LEXAPRO) 10 MG tablet  3. Chronic post-traumatic stress disorder (PTSD)  F43.12 escitalopram (LEXAPRO) 10 MG tablet  4. Major depressive disorder with single episode, remission status unspecified  F32.9 escitalopram (LEXAPRO) 10 MG tablet    Past Psychiatric History:  Schizophrenia, paranoid type Anxiety  Past Medical History:  Past Medical History:  Diagnosis Date   Anxiety    Depression    Medical history non-contributory    Schizophrenia (HCC)    No past surgical history on file.  Family Psychiatric History:  Unknown  Family History: No family history on file.  Social History:  Social History   Socioeconomic History   Marital status: Single    Spouse name: Not on file   Number of children: Not on file   Years of education: Not on file   Highest education level: Not on file  Occupational History   Not on file  Tobacco Use   Smoking status: Former Smoker   Smokeless tobacco: Never Used  Substance and Sexual Activity   Alcohol use: No   Drug use: Yes    Types: Marijuana    Comment: Currently denying substance use   Sexual activity: Yes    Birth control/protection: None  Other Topics Concern   Not on file  Social History Narrative   Not on file   Social Determinants of Health   Financial Resource Strain: Not on file  Food Insecurity:  Not on file  Transportation Needs: Not on file  Physical Activity: Not on file  Stress: Not on file  Social Connections: Not on file    Allergies: No Known Allergies  Metabolic Disorder Labs: Lab Results  Component Value Date   HGBA1C 5.0 10/22/2019   MPG 96.8 10/22/2019   MPG 93.93 08/03/2018   Lab Results  Component Value Date   PROLACTIN 9.9 07/06/2016   PROLACTIN 14.0 07/04/2016   Lab Results  Component Value Date   CHOL 219 (H) 10/22/2019   TRIG 103 10/22/2019   HDL 61  10/22/2019   CHOLHDL 3.6 10/22/2019   VLDL 21 10/22/2019   LDLCALC 137 (H) 10/22/2019   LDLCALC 138 (H) 08/03/2018   Lab Results  Component Value Date   TSH 1.564 10/22/2019   TSH 1.961 02/06/2019    Therapeutic Level Labs: No results found for: LITHIUM No results found for: VALPROATE No components found for:  CBMZ  Current Medications: Current Outpatient Medications  Medication Sig Dispense Refill   escitalopram (LEXAPRO) 10 MG tablet Take 1 tablet (10 mg total) by mouth daily. 30 tablet 1   benztropine (COGENTIN) 2 MG tablet Take 1 tablet (2 mg total) by mouth 2 (two) times daily. 60 tablet 1   haloperidol (HALDOL) 5 MG tablet Take 1 tablet (5 mg total) by mouth 2 (two) times daily. 60 tablet 1   hydrOXYzine (ATARAX/VISTARIL) 25 MG tablet Take 1 tablet (25 mg total) by mouth 3 (three) times daily as needed for anxiety. 90 tablet 1   No current facility-administered medications for this visit.     Musculoskeletal: Strength & Muscle Tone: within normal limits Gait & Station: normal Patient leans: N/A  Psychiatric Specialty Exam: Review of Systems  Psychiatric/Behavioral: Positive for sleep disturbance. Negative for decreased concentration, dysphoric mood, hallucinations, self-injury and suicidal ideas. The patient is not nervous/anxious and is not hyperactive.     Blood pressure 118/72, pulse 70, height 6\' 2"  (1.88 m), weight 160 lb (72.6 kg), SpO2 100 %.Body mass index is 20.54 kg/m.  General Appearance: Fairly Groomed and Neat  Eye Contact:  Good  Speech:  Clear and Coherent and Normal Rate  Volume:  Normal  Mood:  Euthymic  Affect:  Congruent and Depressed  Thought Process:  Coherent, Goal Directed and Descriptions of Associations: Intact  Orientation:  Full (Time, Place, and Person)  Thought Content: WDL, Logical and Paranoid Ideation   Suicidal Thoughts:  No  Homicidal Thoughts:  No  Memory:  Immediate;   Good Recent;   Fair Remote;   Fair  Judgement:   Good  Insight:  Fair  Psychomotor Activity:  Normal  Concentration:  Concentration: Good and Attention Span: Good  Recall:  Good  Fund of Knowledge: Good  Language: Good  Akathisia:  NA  Handed:  Right  AIMS (if indicated): not done  Assets:  Communication Skills Desire for Improvement Housing Social Support  ADL's:  Intact  Cognition: WNL  Sleep:  Fair   Screenings: AIMS   Flowsheet Row Admission (Discharged) from 02/28/2020 in [facility] BE[facility]INE [facility]) [facility] Ad[facility] (Discharged) from [facility] Admission (Discharged) from OP Visit from 09/17/2019 [facility] Admission (Discharged) from OP Visit from 02/04/2019 [facility]  AIMS Total Score 0 0 0 0 0    AUDIT   Flowsheet Row Admission (Discharged) from 02/28/2020 in [facility]  INPATIENT BEHAVIORAL MEDICINE Ad[facility]) [facility] Admission (Discharged) from OP Visit [facility] Admission (Discharged) from OP Visit from 02/04/2019 [facility] (Discharged) [facility]  Alcohol Use Disorder Identification Test Final Score (AUDIT) 1 0 0 2 0    PHQ2-9   Flowsheet Row ED from 06/04/2020 in [facility]  PHQ-2 Total Score 3  PHQ-9 Total Score 9    Flowsheet Row ED from 06/04/2020 in [facility] Admission (Discharged) from 02/28/2020 in [facility] INPATIENT BEHAVIORAL MEDICINE ED from 02/26/2020 in [facility]GENCY DEPARTMENT  C-SSRS RISK CATEGORY Error: Question 6 not populated No Risk No Risk       Assessment and Plan:  Mikle A. Senger is a 30 year old male with a past  psychiatric historysignificant for schizophrenia and anxiety who [facility]  Patient reports that he has been experiencing side effects from the Seroquel that manifest as blurry vision.  Patient has since discontinued taking Seroquel.  Patient also endorses the following depressive symptoms: lack of motivation, decreased energy, difficulty concentrating, and depressed mood.  Patient attributes some of the symptoms to feeling like his family are not who they say they are.  Patient was recommended being placed on an antidepressant.  Patient was agreeable to suggestion.  Patient will be placed on Lexapro 10 mg daily for the management of his current depressive symptoms.  Will reassess depressive state on subsequent encounters.  Patient's medications will be e-prescribed to pharmacy of choice.  1. Schizophrenia, paranoid type (HCC)  - benztropine (COGENTIN) 2 MG tablet; Take 1 tablet (2 mg total) by mouth 2 (two) times daily.  Dispense: 60 tablet; Refill: 1 - haloperidol (HALDOL) 5 MG tablet; Take 1 tablet (5 mg total) by mouth 2 (two) times daily.  Dispense: 60 tablet; Refill: 1  2. Anxiety state  - escitalopram (LEXAPRO) 10 MG tablet; Take 1 tablet (10 mg total) by mouth daily.  Dispense: 30 tablet; Refill: 1  3. Chronic post-traumatic stress disorder (PTSD)  - escitalopram (LEXAPRO) 10 MG tablet; Take 1 tablet (10 mg total) by mouth daily.  Dispense: 30 tablet; Refill: 1  4. Major depressive disorder with single episode, remission status unspecified  - escitalopram (LEXAPRO) 10 MG tablet; Take 1 tablet (10 mg total) by mouth daily.  Dispense: 30 tablet; Refill: 1  Patient to follow up in 4 weeks  Meta Hatchet, PA 07/03/2020, 7:55 PM

## 2020-07-03 NOTE — Progress Notes (Signed)
   THERAPIST PROGRESS NOTE  Session Time: 2:00PM  Participation Level: Active  Behavioral Response: Fairly GroomedAlertPleasant  Type of Therapy: Individual Therapy  Treatment Goals addressed: Diagnosis: Schizophrenia, paranoid type  Interventions: Psychosocial Skills: ADLs and Supportive  Summary: Jake Neal is a 30 y.o. male who presents to [facility] for a scheduled individual therapy session. Clt started session by rating his depression at a 7 "feeling a little down" and his anxiety at a 5, both on a scale 0 to 10 with 10 being the worse. Clt reports still feeling like he isn't safe in his home with his family. This feelings are attributed to his delusion that his family "aren't really themselves". When asked what he's been doing to manage his stressors, pt states "I've been trying to stay to myself". He reports getting 4 hours of sleep. Pt reports he has been showering a couple times a week. Clt was encouraged to completed ADLs by starting a daily routine. Clt agreed to complete daily routine to-do list. Clt denied SI/HI, while alert and oriented x4.  Suicidal/Homicidal: No  Therapist Response: Therapist offered encouragement and support. Therapist developed an ADL to-do list for clt to complete between sessions. To-do consisted of a daily routine including the following: use the restroom, take a shower, put on clean clothes, eat a balanced meal, take prescribed medicine, and go for a walk or listen to music.  Plan: Return again in 2 weeks.  Diagnosis: Axis I: Schizophrenia, paraniod type    Axis II: No diagnosis    Mamie Nick, Counselor 07/03/2020

## 2020-07-10 ENCOUNTER — Other Ambulatory Visit: Payer: Self-pay

## 2020-07-10 ENCOUNTER — Ambulatory Visit (INDEPENDENT_AMBULATORY_CARE_PROVIDER_SITE_OTHER): Payer: Medicaid Other | Admitting: Behavioral Health

## 2020-07-10 DIAGNOSIS — F200 Paranoid schizophrenia: Secondary | ICD-10-CM | POA: Diagnosis not present

## 2020-07-10 NOTE — Progress Notes (Signed)
   THERAPIST PROGRESS NOTE  Session Time: 2:00PM  Participation Level: Active  Behavioral Response: Fairly GroomedAlertDepressed  Type of Therapy: Individual Therapy  Treatment Goals addressed: Diagnosis: Schizophrenia  Interventions: Psychosocial Skills: ADLs and Supportive  Summary: Jake Neal is a 30 y.o. male who presents to [facility] for a scheduled individual therapy session. Clt started session by sharing that he recently traveled to [place] with his aunt and cousin to visit his grandmother and other cousins. Clt reports he was apprehensive about going on this 8 hour road trip with his family as he continues to believe his family members are not really themselves. Clt shared that he has been feeling "deep sadness and feeling overwhelmed". He rated his depression at a 2 on a scale 0 to 10, with zero being the worse. [name] further explains "I ain't the same person I use to be ... I feel dead on the inside ... I want to get better but it's hard". Clt briefly shared that he often feels trapped inside his mind. Clt practice some thought stopping techniques. Clt was encouraged to use these thought stopping techniques when he experiences negative thoughts. Clt was alert and coherent during session and he did not appear to be responding to internal stimuli. Clt agreed to work towards improving his ADLs and hygiene, as evidenced by, clt taking 2 showers out of 7 days. Clt currently takes 1 shower a week, per his report and he was not agreeable to anything more than 2 showers a week at this time.  Suicidal/Homicidal: No  Therapist Response: Therapist offered support and encouragement.   Plan: Return again in 2 weeks.  Diagnosis: Axis I: Schizophrenia    Axis II: No diagnosis    Mamie Nick, Counselor 07/10/2020

## 2020-07-24 ENCOUNTER — Ambulatory Visit (INDEPENDENT_AMBULATORY_CARE_PROVIDER_SITE_OTHER): Payer: Medicaid Other | Admitting: Behavioral Health

## 2020-07-24 ENCOUNTER — Other Ambulatory Visit: Payer: Self-pay

## 2020-07-24 DIAGNOSIS — F200 Paranoid schizophrenia: Secondary | ICD-10-CM | POA: Diagnosis not present

## 2020-07-24 NOTE — Progress Notes (Signed)
   THERAPIST PROGRESS NOTE  Session Time: 2:00PM  Participation Level: Minimal  Behavioral Response: Fairly GroomedAlertPleasant  Type of Therapy: Individual Therapy  Treatment Goals addressed: Coping  Interventions: Supportive  Summary: Jake Neal is a 30 y.o. male who presents to [facility] for a scheduled individual therapy session. Clt reports he has not completed his goal of taking at least 2 showers a week. Clt states "I want to get my mental right because my thoughts don't be right". Clt reports he plans to start playing basketball when the weather gets warmer. Clt notes that he misses his parents; however, due to clt paranoid delusion he is hesitant to contact them. Clt rated his depression at a 5 on a scale 0 to 10, with 0 being the worse. He states seeking his parents would make him feel better. Clt reports it being difficult for him to replace his negative thoughts with positive thoughts. Clt denied SI/HI/AVH.  Suicidal/Homicidal: No  Therapist Response: Therapist offered encouragement and support.  Plan: Return again in 3 weeks.  Diagnosis: Axis I: Chronic Paranoid Schizophrenia    Axis II: No diagnosis    Mamie Nick, Counselor 07/24/2020

## 2020-08-01 ENCOUNTER — Other Ambulatory Visit: Payer: Self-pay

## 2020-08-01 ENCOUNTER — Ambulatory Visit (INDEPENDENT_AMBULATORY_CARE_PROVIDER_SITE_OTHER): Payer: Medicaid Other | Admitting: Physician Assistant

## 2020-08-01 DIAGNOSIS — F4312 Post-traumatic stress disorder, chronic: Secondary | ICD-10-CM

## 2020-08-01 DIAGNOSIS — F411 Generalized anxiety disorder: Secondary | ICD-10-CM

## 2020-08-01 DIAGNOSIS — F329 Major depressive disorder, single episode, unspecified: Secondary | ICD-10-CM | POA: Diagnosis not present

## 2020-08-01 DIAGNOSIS — F200 Paranoid schizophrenia: Secondary | ICD-10-CM

## 2020-08-02 ENCOUNTER — Encounter (HOSPITAL_COMMUNITY): Payer: Self-pay | Admitting: Physician Assistant

## 2020-08-02 MED ORDER — HALOPERIDOL 5 MG PO TABS: 5.0000 mg | ORAL_TABLET | Freq: Two times a day (BID) | ORAL | 1 refills | Status: DC

## 2020-08-02 MED ORDER — OLANZAPINE 5 MG PO TABS: 5.0000 mg | ORAL_TABLET | Freq: Every day | ORAL | 1 refills | Status: DC

## 2020-08-02 MED ORDER — BENZTROPINE MESYLATE 2 MG PO TABS: 2.0000 mg | ORAL_TABLET | Freq: Two times a day (BID) | ORAL | 1 refills | Status: DC

## 2020-08-02 NOTE — Progress Notes (Signed)
BH MD/PA/NP OP Progress Note  08/02/2020 3:50 AM Jake Neal  MRN:  [id number]  Chief Complaint: Follow-up and medication management  HPI:   Jake Neal is a 30 year old male with a past psychiatric history significant for severe anemia and anxiety who presents to [facility] behavioral health outpatient clinic for follow-up and medication management.  Patient is currently being managed on the following medications:  Seroquel 5 mg 2 times daily Benztropine (Cogentin) 0.5 mg 2 times daily Escitalopram 10 mg daily  Patient expresses that he has been experiencing blurry vision with the use of his Seroquel.  Patient was informed that Seroquel was discontinued on the last encounter due to reporting the same concern.  Patient expresses that he has no other concerns with his current medication regimen.  Patient states that he has not taken escitalopram 10 mg daily for the management of his depression.  Patient reports that he was unaware that he was prescribed that medication and has not yet picked up his medication.  Patient expresses that he still feels sad at times due to things going on in his life.  Patient denied wanting to explain some of the stressors in his life.  Patient expresses anxiety he rates a 7 out of 10.  Patient contributes his anxiety to his family.  Patient states that he is also been recently experiencing lightheadedness and stomach pains that is been going on for 2 weeks.  Patient states that when his symptoms first started he experienced vomiting.  Patient is calm, cooperative, and fully engaged in conversation during the encounter.  Patient reports that he is doing all right but states that his stomach is still currently hurting.  Patient denies suicidal or homicidal ideations.  Patient expresses that he gets along with his family but states that he feels like he must stay out of the way.  Patient reports that whenever he is living at home with his family members, he  feels that he is gambling with his life because he does not feel like his family is who they say they are.Marland Kitchen He further denies auditory or visual hallucinations.  Patient endorses fair sleep and receives on average 4 hours of sleep each night.  Patient reports poor appetite and states that he roughly has 1 meal per day. Patient expresses that he has not eaten at all today wanted to know if he had to take his medications with food.  Patient was advised to take his medications with food in order for his medications to be absorbed more efficiently.  Patient was receptive to instruction.  Patient denies alcohol consumption, tobacco use, and illicit drug use.  Visit Diagnosis:    ICD-10-CM   1. Schizophrenia, paranoid type (HCC)  F20.0   2. Generalized anxiety disorder  F41.1   3. Chronic post-traumatic stress disorder (PTSD)  F43.12   4. Major depressive disorder with single episode, remission status unspecified  F32.9     Past Psychiatric History:  Schizophrenia, paranoid type Anxiety  Past Medical History:  Past Medical History:  Diagnosis Date  . Anxiety   . Depression   . Medical history non-contributory   . Schizophrenia (HCC)    History reviewed. No pertinent surgical history.  Family Psychiatric History:  Unknown  Family History: History reviewed. No pertinent family history.  Social History:  Social History   Socioeconomic History  . Marital status: Single    Spouse name: Not on file  . Number of children: Not on file  . Years  of education: Not on file  . Highest education level: Not on file  Occupational History  . Not on file  Tobacco Use  . Smoking status: Former Games developermoker  . Smokeless tobacco: Never Used  Substance and Sexual Activity  . Alcohol use: No  . Drug use: Yes    Types: Marijuana    Comment: Currently denying substance use  . Sexual activity: Yes    Birth control/protection: None  Other Topics Concern  . Not on file  Social History Narrative  . Not on  file   Social Determinants of Health   Financial Resource Strain: Not on file  Food Insecurity: Not on file  Transportation Needs: Not on file  Physical Activity: Not on file  Stress: Not on file  Social Connections: Not on file    Allergies: No Known Allergies  Metabolic Disorder Labs: Lab Results  Component Value Date   HGBA1C 5.0 10/22/2019   MPG 96.8 10/22/2019   MPG 93.93 08/03/2018   Lab Results  Component Value Date   PROLACTIN 9.9 07/06/2016   PROLACTIN 14.0 07/04/2016   Lab Results  Component Value Date   CHOL 219 (H) 10/22/2019   TRIG 103 10/22/2019   HDL 61 10/22/2019   CHOLHDL 3.6 10/22/2019   VLDL 21 10/22/2019   LDLCALC 137 (H) 10/22/2019   LDLCALC 138 (H) 08/03/2018   Lab Results  Component Value Date   TSH 1.564 10/22/2019   TSH 1.961 02/06/2019    Therapeutic Level Labs: No results found for: LITHIUM No results found for: VALPROATE No components found for:  CBMZ  Current Medications: Current Outpatient Medications  Medication Sig Dispense Refill  . benztropine (COGENTIN) 2 MG tablet Take 1 tablet (2 mg total) by mouth 2 (two) times daily. 60 tablet 1  . escitalopram (LEXAPRO) 10 MG tablet Take 1 tablet (10 mg total) by mouth daily. 30 tablet 1  . haloperidol (HALDOL) 5 MG tablet Take 1 tablet (5 mg total) by mouth 2 (two) times daily. 60 tablet 1  . hydrOXYzine (ATARAX/VISTARIL) 25 MG tablet Take 1 tablet (25 mg total) by mouth 3 (three) times daily as needed for anxiety. 90 tablet 1   No current facility-administered medications for this visit.     Musculoskeletal: Strength & Muscle Tone: within normal limits Gait & Station: normal Patient leans: N/A  Psychiatric Specialty Exam: Review of Systems  Psychiatric/Behavioral: Positive for behavioral problems, dysphoric mood and sleep disturbance. Negative for agitation, decreased concentration, hallucinations, self-injury and suicidal ideas. The patient is nervous/anxious. The patient is  not hyperactive.     There were no vitals taken for this visit.There is no height or weight on file to calculate BMI.  General Appearance: Well Groomed  Eye Contact:  Good  Speech:  Clear and Coherent and Normal Rate  Volume:  Normal  Mood:  Dysphoric  Affect:  Congruent and Depressed  Thought Process:  Coherent, Goal Directed and Descriptions of Associations: Intact  Orientation:  Full (Time, Place, and Person)  Thought Content: WDL, Delusions and Paranoid Ideation   Suicidal Thoughts:  No  Homicidal Thoughts:  No  Memory:  Immediate;   Good Recent;   Fair Remote;   Fair  Judgement:  Good  Insight:  Fair  Psychomotor Activity:  Normal  Concentration:  Concentration: Good and Attention Span: Good  Recall:  Good  Fund of Knowledge: Good  Language: Good  Akathisia:  NA  Handed:  Right  AIMS (if indicated): not done  Assets:  Communication  Skills Desire for Improvement Housing Social Support  ADL's:  Intact  Cognition: WNL  Sleep:  Fair   Screenings: AIMS   Flowsheet Row Admission (Discharged) from 02/28/2020 in [facility]) [facility] Admission (Discharged) from OP Visit [facility] Admission (Discharged) from OP Visit from 09[facility] Admission (Discharged) from OP Visit from 02/04/2019 [facility]  AIMS Total Score 0 0 0 0 0    AUDIT   Flowsheet Row Admission (Discharged) from 02/28/2020 in [facility] INPATIENT BE[facility]AVIORAL MEDICINE Admission (Discharged) [facility] Admission (Discharged) from OP Visit from [facility] Admission (Discharged) from OP Visit from 02/04/2019 [facility] Admission (Discharged) from [facility]  Alcohol Use Disorder Identification Test Final Score (AUDIT) 1 0 0 2 0    GAD-7   Flowsheet Row Office Visit from 08/01/2020 in [facility]  Total GAD-7 Score 21    PHQ2-9   Flowsheet Row Office Visit from 08/01/2020 in [facility] ED from 06/04/2020 in [facility]  PHQ-2 Total Score 2 3  PHQ-9 Total Score 18 9    Flowsheet Row Office Visit from 08/01/2020 in [facility] ED from 06/04/2020 in [facility] Admission (Discharged) from 02/28/2020 in [facility] INPATIENT BEHAVIORAL MEDICINE  C-SSRS RISK CATEGORY Low Risk Error: Question 6 not populated No Risk       Assessment and Plan:   Jake Neal is a 30 year old male with a past psychiatric history significant for severe anemia and anxiety who presents to [facility] behavioral health outpatient clinic for follow-up and medication management.  Patient reports that he has been experiencing blurry vision from the use of the Seroquel, however, patient was informed that his Seroquel was discontinued on the last encounter.  Patient expresses feeling sad due to stressors in his life that he did not go into detail about.  Patient also expresses that he feels like his family are not who they say they are.  Patient denies any conflict at his current place of residence but states that he feels like he must stay out of his family's way.  Patient was recommended picking up his escitalopram 10 mg daily for the management of his depressive symptoms.  Patient was recommended being placed on olanzapine 5 mg at bedtime for the management of his mood and sleep disturbances.  Patient was agreeable to recommendations.  Patient's medications will be e-prescribed to pharmacy of choice.  1. Schizophrenia, paranoid type (HCC)  - OLANZapine (ZYPREXA) 5 MG tablet; Take 1 tablet (5 mg total) by mouth at bedtime.   Dispense: 30 tablet; Refill: 1 - haloperidol (HALDOL) 5 MG tablet; Take 1 tablet (5 mg total) by mouth 2 (two) times daily.  Dispense: 60 tablet; Refill: 1 - benztropine (COGENTIN) 2 MG tablet; Take 1 tablet (2 mg total) by mouth 2 (two) times daily.  Dispense: 60 tablet; Refill: 1  2. Generalized anxiety disorder Patient was advised to pick up his escitalopram 10 mg daily for the management of his anxiety  3. Chronic post-traumatic stress disorder (PTSD) Patient was advised to pick up his escitalopram 10 mg daily for the management of his PTSD  4. Major depressive disorder with single  episode, remission status unspecified Patient was advised to pick up his escitalopram 10 mg daily for the management of his depressive symptoms  Patient to follow-up in 6 weeks  Meta Hatchet, PA 08/02/2020, 3:50 AM

## 2020-08-14 ENCOUNTER — Ambulatory Visit (HOSPITAL_COMMUNITY): Payer: Medicaid Other | Admitting: Behavioral Health

## 2020-08-14 ENCOUNTER — Other Ambulatory Visit: Payer: Self-pay

## 2020-09-04 ENCOUNTER — Other Ambulatory Visit: Payer: Self-pay

## 2020-09-04 ENCOUNTER — Ambulatory Visit (HOSPITAL_COMMUNITY): Payer: Medicaid Other | Admitting: Behavioral Health

## 2020-09-04 ENCOUNTER — Encounter (HOSPITAL_COMMUNITY): Payer: Self-pay

## 2020-09-04 ENCOUNTER — Emergency Department (HOSPITAL_COMMUNITY)
Admission: EM | Admit: 2020-09-04 | Discharge: 2020-09-04 | Disposition: A | Discharge status: Home / Self Care | Payer: Self-pay | Attending: Emergency Medicine | Admitting: Emergency Medicine

## 2020-09-04 DIAGNOSIS — R1084 Generalized abdominal pain: Secondary | ICD-10-CM | POA: Insufficient documentation

## 2020-09-04 DIAGNOSIS — R809 Proteinuria, unspecified: Secondary | ICD-10-CM | POA: Insufficient documentation

## 2020-09-04 DIAGNOSIS — R112 Nausea with vomiting, unspecified: Secondary | ICD-10-CM | POA: Insufficient documentation

## 2020-09-04 DIAGNOSIS — R630 Anorexia: Secondary | ICD-10-CM | POA: Insufficient documentation

## 2020-09-04 DIAGNOSIS — E860 Dehydration: Secondary | ICD-10-CM | POA: Insufficient documentation

## 2020-09-04 DIAGNOSIS — Z87891 Personal history of nicotine dependence: Secondary | ICD-10-CM | POA: Insufficient documentation

## 2020-09-04 LAB — COMPREHENSIVE METABOLIC PANEL
ALT: 11 U/L (ref 0–44)
AST: 15 U/L (ref 15–41)
Albumin: 4.6 g/dL (ref 3.5–5.0)
Alkaline Phosphatase: 54 U/L (ref 38–126)
Anion gap: 14 (ref 5–15)
BUN: 8 mg/dL (ref 6–20)
CO2: 21 mmol/L — ABNORMAL LOW (ref 22–32)
Calcium: 9.9 mg/dL (ref 8.9–10.3)
Chloride: 103 mmol/L (ref 98–111)
Creatinine, Ser: 1.31 mg/dL — ABNORMAL HIGH (ref 0.61–1.24)
GFR, Estimated: >60 mL/min (ref >60)
Glucose, Bld: 86 mg/dL (ref 70–99)
Potassium: 3.7 mmol/L (ref 3.5–5.1)
Sodium: 138 mmol/L (ref 135–145)
Total Bilirubin: 2.4 mg/dL — ABNORMAL HIGH (ref 0.3–1.2)
Total Protein: 7.9 g/dL (ref 6.5–8.1)

## 2020-09-04 LAB — URINALYSIS, ROUTINE W REFLEX MICROSCOPIC
Glucose, UA: NEGATIVE mg/dL
Hgb urine dipstick: NEGATIVE
Ketones, ur: 80 mg/dL — AB
Leukocytes,Ua: NEGATIVE
Nitrite: NEGATIVE
Protein, ur: 100 mg/dL — AB
Specific Gravity, Urine: 1.032 — ABNORMAL HIGH (ref 1.005–1.030)
pH: 6 (ref 5.0–8.0)

## 2020-09-04 LAB — CBC
HCT: 42.6 % (ref 39.0–52.0)
Hemoglobin: 14.5 g/dL (ref 13.0–17.0)
MCH: 31.9 pg (ref 26.0–34.0)
MCHC: 34 g/dL (ref 30.0–36.0)
MCV: 93.6 fL (ref 80.0–100.0)
Platelets: 156 10*3/uL (ref 150–400)
RBC: 4.55 MIL/uL (ref 4.22–5.81)
RDW: 13 % (ref 11.5–15.5)
WBC: 4.4 10*3/uL (ref 4.0–10.5)
nRBC: 0 % (ref 0.0–0.2)

## 2020-09-04 LAB — LIPASE, BLOOD: Lipase: 26 U/L (ref 11–51)

## 2020-09-04 MED ORDER — DICYCLOMINE HCL 10 MG PO CAPS
20.0000 mg | ORAL_CAPSULE | Freq: Once | ORAL | Status: AC
  Administered 2020-09-04: 20 mg via ORAL
  Filled 2020-09-04: qty 2

## 2020-09-04 MED ORDER — ONDANSETRON 4 MG PO TBDP: 4.0000 mg | ORAL_TABLET | Freq: Three times a day (TID) | ORAL | 0 refills | Status: DC | PRN

## 2020-09-04 MED ORDER — ONDANSETRON 4 MG PO TBDP
8.0000 mg | ORAL_TABLET | Freq: Once | ORAL | Status: AC
  Administered 2020-09-04: 8 mg via ORAL
  Filled 2020-09-04: qty 2

## 2020-09-04 MED ORDER — DICYCLOMINE HCL 20 MG PO TABS: 20.0000 mg | ORAL_TABLET | Freq: Two times a day (BID) | ORAL | 0 refills | Status: DC

## 2020-09-04 NOTE — ED Triage Notes (Signed)
Pt reports he thinks he might have food poisoning. Pt reports n&V abd pain x1 week.

## 2020-09-04 NOTE — ED Notes (Signed)
Patient given discharge paperwork and instructions. Verbalized understanding of teaching. No IV access. Ambulatory to exit in NAD with steady gait. 

## 2020-09-04 NOTE — ED Provider Notes (Signed)
[facility] EMERGENCY DEPARTMENT Provider Note   CSN: [id number] Arrival date & time: 09/04/20  1337     History Chief Complaint  Patient presents with  . Abdominal Pain    Jake Neal is a 30 y.o. male.  HPI Jake Neal is a 30 yo male with a PMHx of schizophrenia, PTSD, Anxiety, and MDD that presents today for nausea, vomiting, abdominal pain. Patient states he first experienced the abdominal pain last Thursday after eating chicken tenders from a new restaurant. He reports he has vomited once or twice a day since then with constant, diffuse abdominal pain. The vomit is non bloody, non bilious and is typically the color of whatever he has tried to eat prior. He states he has been unable to eat or drink much since last Thursday, and his last full meal was day before yesterday. He reports he has not tried anything to try to resolve the pain, nausea, and vomiting. He denies any sick contacts and states no one else ate the chicken tenders. He endorses feeling slightly warm but is unsure if he ever ran a fever. He denies any diarrhea, shortness of breath, or palpitations. Of note, he also states he has been unable to take his daily medications of haloperidol, benztropine, and quetiapine.      Past Medical History:  Diagnosis Date  . Anxiety   . Depression   . Medical history non-contributory   . Schizophrenia [facility])     Patient Active Problem List   Diagnosis Date Noted  . Major depressive disorder with single episode 07/03/2020  . Anxiety state 06/27/2020  . Schizophrenia (HCC) 02/28/2020  . Schizophrenia, paranoid type (HCC) 02/04/2019  . Chronic post-traumatic stress disorder (PTSD) 01/28/2019    History reviewed. No pertinent surgical history.     History reviewed. No pertinent family history.  Social History   Tobacco Use  . Smoking status: Former Games developer  . Smokeless tobacco: Never Used  Substance Use Topics  . Alcohol use: No  . Drug use: Yes     Types: Marijuana    Comment: Currently denying substance use    Home Medications Prior to Admission medications   Medication Sig Start Date End Date Taking? Authorizing Provider  dicyclomine (BENTYL) 20 MG tablet Take 1 tablet (20 mg total) by mouth 2 (two) times daily. 09/04/20  Yes Puja Caffey S, PA  OLANZapine (ZYPREXA) 5 MG tablet Take 1 tablet (5 mg total) by mouth at bedtime. 08/02/20 08/02/21  Nwoko, Tommas Olp, PA  ondansetron (ZOFRAN ODT) 4 MG disintegrating tablet Take 1 tablet (4 mg total) by mouth every 8 (eight) hours as needed for nausea or vomiting. 09/04/20  Yes Lynsee Wands S, PA  benztropine (COGENTIN) 2 MG tablet Take 1 tablet (2 mg total) by mouth 2 (two) times daily. 08/02/20   Nwoko, Tommas Olp, PA  escitalopram (LEXAPRO) 10 MG tablet Take 1 tablet (10 mg total) by mouth daily. 07/03/20 07/03/21  Nwoko, Tommas Olp, PA  haloperidol (HALDOL) 5 MG tablet Take 1 tablet (5 mg total) by mouth 2 (two) times daily. 08/02/20   Nwoko, Tommas Olp, PA  hydrOXYzine (ATARAX/VISTARIL) 25 MG tablet Take 1 tablet (25 mg total) by mouth 3 (three) times daily as needed for anxiety. 03/16/20   Meta Hatchet, PA    Allergies    Patient has no known allergies.  Review of Systems   Review of Systems  Constitutional: Negative for chills and fever.  HENT: Negative for congestion.  Eyes: Negative for pain.  Respiratory: Negative for cough and shortness of breath.   Cardiovascular: Negative for chest pain and leg swelling.  Gastrointestinal: Positive for abdominal pain, nausea and vomiting.  Genitourinary: Negative for dysuria.  Musculoskeletal: Negative for myalgias.  Skin: Negative for rash.  Neurological: Negative for dizziness and headaches.    Physical Exam Updated Vital Signs BP 94/81 (BP Location: Left Arm)   Pulse 98   Temp 98.6 F (37 C) (Oral)   Resp 17   SpO2 97%   Physical Exam Vitals and nursing note reviewed.  Constitutional:      General: He is not in acute  distress. HENT:     Head: Normocephalic and atraumatic.     Nose: Nose normal.  Eyes:     General: No scleral icterus. Cardiovascular:     Rate and Rhythm: Normal rate and regular rhythm.     Pulses: Normal pulses.     Heart sounds: Normal heart sounds.  Pulmonary:     Effort: Pulmonary effort is normal. No respiratory distress.     Breath sounds: No wheezing.  Abdominal:     Palpations: Abdomen is soft.     Tenderness: There is abdominal tenderness.     Comments: Mild diffuse TTP No focal TTP  Musculoskeletal:     Cervical back: Normal range of motion.     Right lower leg: No edema.     Left lower leg: No edema.  Skin:    General: Skin is warm and dry.     Capillary Refill: Capillary refill takes less than 2 seconds.  Neurological:     Mental Status: He is alert. Mental status is at baseline.  Psychiatric:        Mood and Affect: Mood normal.        Behavior: Behavior normal.     ED Results / Procedures / Treatments   Labs (all labs ordered are listed, but only abnormal results are displayed) Labs Reviewed  COMPREHENSIVE METABOLIC PANEL - Abnormal; Notable for the following components:      Result Value   CO2 21 (*)    Creatinine, Ser 1.31 (*)    Total Bilirubin 2.4 (*)    All other components within normal limits  URINALYSIS, ROUTINE W REFLEX MICROSCOPIC - Abnormal; Notable for the following components:   Color, Urine AMBER (*)    APPearance HAZY (*)    Specific Gravity, Urine 1.032 (*)    Bilirubin Urine SMALL (*)    Ketones, ur 80 (*)    Protein, ur 100 (*)    Bacteria, UA RARE (*)    All other components within normal limits  LIPASE, BLOOD  CBC    EKG None  Radiology No results found.  Procedures Procedures   Medications Ordered in ED Medications  ondansetron (ZOFRAN-ODT) disintegrating tablet 8 mg (8 mg Oral Given 09/04/20 1553)  dicyclomine (BENTYL) capsule 20 mg (20 mg Oral Given 09/04/20 1552)    ED Course  I have reviewed the triage  vital signs and the nursing notes.  Pertinent labs & imaging results that were available during my care of the patient were reviewed by me and considered in my medical decision making (see chart for details).    MDM Rules/Calculators/A&P                          Patient is 30 year old male presenting today with nausea vomiting some abdominal pain and decreased appetite for the  past 1 week.  He states that he had had some loose stool a week ago but he states that because he has not yet been eating that she has not had any bowel movements.  Physical exam is unremarkable.  Patient does appear somewhat dehydrated but has received Zofran and is tolerating p.o. now including fluids.  Will provide patient with Bentyl as well.  He states he is not currently having any abdominal pain.  Specifically patient is not having any tenderness with abdominal palpation.  No guarding or rebound no CVA tenderness.  Patient specific gravity is somewhat elevated consistent with dehydration.  He does have a mild amount of proteinuria and some ketones consistent with dehydration/starvation.  CMP is unremarkable.  Creatinine is at patient's baseline.  No significant electrolyte derangements.  Lipase within normal limits at pancreatitis.  CBC without leukocytosis or anemia.   Patient continues to tolerate fluids p.o. and states he feels much improved.  He received his medications 45 minutes ago.  Has had no episodes of emesis.  Is tolerating crackers and fluids.  Will discharge at this time with Bentyl, Zofran and follow-up with PCP.  Counseled on the importance of following up with PCP to recheck his proteinuria.  DC instructions Please drink plenty of water.  Please use Zofran and Bentyl as prescribed.  Please follow-up with [place] and wellness clinic.  You do have some protein in your urine this may be related to dehydration however it is important to have this rechecked.  Return to the ER for any new or concerning  symptoms.  Final Clinical Impression(s) / ED Diagnoses Final diagnoses:  Proteinuria, unspecified type  Generalized abdominal pain  Non-intractable vomiting with nausea, unspecified vomiting type    Rx / DC Orders ED Discharge Orders         Ordered    ondansetron (ZOFRAN ODT) 4 MG disintegrating tablet  Every 8 hours PRN        09/04/20 1712    dicyclomine (BENTYL) 20 MG tablet  2 times daily        09/04/20 1712           Gailen Shelter, [place] 09/05/20 0025    Charlynne Pander, MD 09/05/20 [phone]

## 2020-09-04 NOTE — Discharge Instructions (Signed)
Please drink plenty of water.  Please use Zofran and Bentyl as prescribed.  Please follow-up with  and wellness clinic.  You do have some protein in your urine this may be related to dehydration however it is important to have this rechecked.   Return to the ER for any new or concerning symptoms.

## 2020-09-12 ENCOUNTER — Other Ambulatory Visit: Payer: Self-pay

## 2020-09-12 ENCOUNTER — Ambulatory Visit (INDEPENDENT_AMBULATORY_CARE_PROVIDER_SITE_OTHER): Payer: Medicaid Other | Admitting: Physician Assistant

## 2020-09-12 ENCOUNTER — Encounter (HOSPITAL_COMMUNITY): Payer: Self-pay | Admitting: Physician Assistant

## 2020-09-12 VITALS — BP 112/85 | HR 67 | Ht 74.0 in | Wt 143.0 lb

## 2020-09-12 DIAGNOSIS — F4312 Post-traumatic stress disorder, chronic: Secondary | ICD-10-CM

## 2020-09-12 DIAGNOSIS — F411 Generalized anxiety disorder: Secondary | ICD-10-CM | POA: Diagnosis not present

## 2020-09-12 DIAGNOSIS — F200 Paranoid schizophrenia: Secondary | ICD-10-CM

## 2020-09-12 MED ORDER — HALOPERIDOL 5 MG PO TABS: 5.0000 mg | ORAL_TABLET | Freq: Two times a day (BID) | ORAL | 1 refills | Status: DC

## 2020-09-12 MED ORDER — BENZTROPINE MESYLATE 2 MG PO TABS: 2.0000 mg | ORAL_TABLET | Freq: Two times a day (BID) | ORAL | 1 refills | Status: DC

## 2020-09-12 NOTE — Progress Notes (Signed)
BH MD/PA/NP OP Progress Note  09/12/2020 3:57 PM Jake Neal  MRN:  [id number]  Chief Complaint:  Chief Complaint    Medication Management     HPI:   Jake Neal is a 30 year old male with a past psychiatric history significant for schizophrenia (paranoid type), generalized anxiety disorder, and PTSD who presents to [facility] for follow-up medication management.  Patient is currently being managed on the following medications:  Haldol 5 mg 2 times daily Benztropine 2 mg 2 times daily Olanzapine 5 mg at bedtime Escitalopram 10 mg daily  Patient is accompanied by his cousin during the encounter.  Before the start of the encounter, patient's cousin expresses that the patient has been acting differently and has not been taking his medications.  She reports that the patient has been receiving very little sleep and has been having a poor appetite.  When provider tried conversing and asking questions to the patient, patient provided very little information regarding his current issues or problems.  Patient expresses that he has not been taking his medications as scheduled.  He states that that the last time he took his Haldol was yesterday and has not been taking it consistently since the previous day.  Patient was asked why he was taking his medications sporadically to which he replied "I don't know."  Patient informed provider that he never picked up his prescriptions for olanzapine 5 mg at bedtime or escitalopram 10 mg daily.  Patient was unable to provide a specific reason as to why he did not pick up his medications.  Most of the patient's responses were characterized by "I don't know."   Patient is calm, cooperative, and engaged in conversation during the encounter.  Patient becomes somewhat agitated when pressed with questions.  Patient denies any issues or concerns and further denies any changes in his symptoms.  Patient states he is unable to  properly describe his current mood but later states "I'm all right."  Patient denies suicidal or homicidal ideations.  He further denies auditory or visual hallucinations and does not appear to be responding to internal/external stimuli.  Patient denies that outside forces are telling him not to take his medications.  Patient is unable to accurately convey how much sleep he receives.  He did not provide the hours when he goes to bed nor did he provide the amount of time he sleeps.  Patient states he has an appetite but reports that he has not eaten in the last 2 days.  Patient denies alcohol consumption, tobacco use, and illicit drug use.  Visit Diagnosis:    ICD-10-CM   1. Schizophrenia, paranoid type (HCC)  F20.0 haloperidol (HALDOL) 5 MG tablet    benztropine (COGENTIN) 2 MG tablet  2. Generalized anxiety disorder  F41.1   3. Chronic post-traumatic stress disorder (PTSD)  F43.12     Past Psychiatric History:  Schizophrenia, paranoid type Generalized anxiety disorder PTSD  Past Medical History:  Past Medical History:  Diagnosis Date  . Anxiety   . Depression   . Medical history non-contributory   . Schizophrenia (HCC)    No past surgical history on file.  Family Psychiatric History:  Unknown  Family History: No family history on file.  Social History:  Social History   Socioeconomic History  . Marital status: Single    Spouse name: Not on file  . Number of children: Not on file  . Years of education: Not on file  . Highest education  level: Not on file  Occupational History  . Not on file  Tobacco Use  . Smoking status: Former Games developer  . Smokeless tobacco: Never Used  Substance and Sexual Activity  . Alcohol use: No  . Drug use: Yes    Types: Marijuana    Comment: Currently denying substance use  . Sexual activity: Yes    Birth control/protection: None  Other Topics Concern  . Not on file  Social History Narrative  . Not on file   Social Determinants of Health    Financial Resource Strain: Not on file  Food Insecurity: Not on file  Transportation Needs: Not on file  Physical Activity: Not on file  Stress: Not on file  Social Connections: Not on file    Allergies: No Known Allergies  Metabolic Disorder Labs: Lab Results  Component Value Date   HGBA1C 5.0 10/22/2019   MPG 96.8 10/22/2019   MPG 93.93 08/03/2018   Lab Results  Component Value Date   PROLACTIN 9.9 07/06/2016   PROLACTIN 14.0 07/04/2016   Lab Results  Component Value Date   CHOL 219 (H) 10/22/2019   TRIG 103 10/22/2019   HDL 61 10/22/2019   CHOLHDL 3.6 10/22/2019   VLDL 21 10/22/2019   LDLCALC 137 (H) 10/22/2019   LDLCALC 138 (H) 08/03/2018   Lab Results  Component Value Date   TSH 1.564 10/22/2019   TSH 1.961 02/06/2019    Therapeutic Level Labs: No results found for: LITHIUM No results found for: VALPROATE No components found for:  CBMZ  Current Medications: Current Outpatient Medications  Medication Sig Dispense Refill  . benztropine (COGENTIN) 2 MG tablet Take 1 tablet (2 mg total) by mouth 2 (two) times daily. 60 tablet 1  . dicyclomine (BENTYL) 20 MG tablet Take 1 tablet (20 mg total) by mouth 2 (two) times daily. 20 tablet 0  . escitalopram (LEXAPRO) 10 MG tablet Take 1 tablet (10 mg total) by mouth daily. 30 tablet 1  . haloperidol (HALDOL) 5 MG tablet Take 1 tablet (5 mg total) by mouth 2 (two) times daily. 60 tablet 1  . hydrOXYzine (ATARAX/VISTARIL) 25 MG tablet Take 1 tablet (25 mg total) by mouth 3 (three) times daily as needed for anxiety. 90 tablet 1  . ondansetron (ZOFRAN ODT) 4 MG disintegrating tablet Take 1 tablet (4 mg total) by mouth every 8 (eight) hours as needed for nausea or vomiting. 20 tablet 0  . traZODone (DESYREL) 100 MG tablet TAKE 1 TABLET BY MOUTH AT BEDTIME AS NEEDED FOR SLEEP. 7 tablet 0   No current facility-administered medications for this visit.    Musculoskeletal: Strength & Muscle Tone: within normal limits Gait  & Station: normal Patient leans: N/A  Psychiatric Specialty Exam: Review of Systems  Psychiatric/Behavioral: Positive for behavioral problems and sleep disturbance. Negative for decreased concentration, dysphoric mood, hallucinations, self-injury and suicidal ideas. The patient is nervous/anxious. The patient is not hyperactive.     Blood pressure 112/85, pulse 67, height 6\' 2"  (1.88 m), weight 143 lb (64.9 kg).Body mass index is 18.36 kg/m.  General Appearance: Fairly Groomed  Eye Contact:  Minimal  Speech:  Garbled  Volume:  Normal  Mood:  Depressed and Irritable  Affect:  Blunt, Congruent, Depressed and Restricted  Thought Process:  Coherent and Descriptions of Associations: Loose  Orientation:  Full (Time, Place, and Person)  Thought Content: Illogical   Suicidal Thoughts:  No  Homicidal Thoughts:  No  Memory:  Immediate;   Fair Recent;  Fair Remote;   Fair  Judgement:  Impaired  Insight:  Lacking  Psychomotor Activity:  Normal  Concentration:  Concentration: Good and Attention Span: Fair  Recall:  FiservFair  Fund of Knowledge: Poor  Language: Fair  Akathisia:  NA  Handed:  Right  AIMS (if indicated): not done  Assets:  Communication Skills Desire for Improvement Housing Social Support  ADL's:  Impaired  Cognition: Impaired,  Mild  Sleep:  Poor   Screenings: AIMS   Flowsheet Row [facility]) [facility] Ad[facility] (Discharged) from OP Visit from [facility] Admission (Discharged) from OP Visit from 09/17/2019 [facility] Admission (Discharged) from OP Visit from 02/04/2019 in [facility]  AIMS Total Score 0 0 0 0 0    AUDIT   Flowsheet Row [facility]) [facility] Admission (Discharged) from  [facility] Admission (Discharged) from OP Visit from 10[facility] Admission (Discharged) from OP Visit from 02/04/2019 [facility] Admission (Discharged) from [facility]  Alcohol Use Disorder Identification Test Final Score (AUDIT) 1 0 0 2 0    GAD-7   Flowsheet [facility]  Total GAD-7 Score 4 21    PHQ2-9   Flowsheet Row [facility]  PHQ-2 Total Score 6 2 3   PHQ-9 Total Score 18 18 9     Flowsheet Row [facility]  C-SSRS RISK CATEGORY Low Risk Low Risk Error: Question 6 not populated       Assessment and Plan:   Jake Neal is a 30 year old male with a past psychiatric history significant for schizophrenia (paranoid type), generalized anxiety disorder, and PTSD who [facility]-up medication management.  Patient provided very little information regarding his overall presentation.  Patient's cousin, who was present during the encounter, reports that the patient has been acting differently and has not been taking his medications as scheduled.  Patient was unable to provide a specific reason as to why he was not taking his medications or why he had not picked up his prescriptions for olanzapine or escitalopram.  Patient shows no signs of wanting to take  olanzapine or escitalopram nor does he provide a good reason as to why.  Provider had to level with patient and advised the patient to try taking the medications he was originally prescribed (Haldol and benztropine).  Patient informed provider that he may have difficulty paying for the medication.  Patient's cousin informed the provider that if the medications were sent to his pharmacy then his aunt would buy the medications.  Provider informed patient that his medications would be prescribed to pharmacy of choice.  Patient appeared agreeable to plan.  Due to patient's change in behavior, patient may need to be set up with injections to help manage his behavior and  current symptoms.  1. Schizophrenia, paranoid type (HCC)  - haloperidol (HALDOL) 5 MG tablet; Take 1 tablet (5 mg total) by mouth 2 (two) times daily.  Dispense: 60 tablet; Refill: 1 - benztropine (COGENTIN) 2 MG tablet; Take 1 tablet (2 mg total) by mouth 2 (two) times daily.  Dispense: 60 tablet; Refill: 1  2. Generalized anxiety disorder   3. Chronic post-traumatic stress disorder (PTSD)  Patient to follow up in 6 weeks  Meta Hatchet, PA 09/12/2020, 3:57 PM

## 2020-10-02 ENCOUNTER — Encounter: Payer: Self-pay | Admitting: Family Medicine

## 2020-10-02 ENCOUNTER — Other Ambulatory Visit: Payer: Self-pay

## 2020-10-02 ENCOUNTER — Ambulatory Visit (INDEPENDENT_AMBULATORY_CARE_PROVIDER_SITE_OTHER): Payer: Medicaid Other | Admitting: Family Medicine

## 2020-10-02 VITALS — BP 98/62 | HR 80 | Ht 73.0 in | Wt 153.0 lb

## 2020-10-02 DIAGNOSIS — R7989 Other specified abnormal findings of blood chemistry: Secondary | ICD-10-CM | POA: Diagnosis not present

## 2020-10-02 DIAGNOSIS — F200 Paranoid schizophrenia: Secondary | ICD-10-CM | POA: Diagnosis not present

## 2020-10-02 DIAGNOSIS — Z7689 Persons encountering health services in other specified circumstances: Secondary | ICD-10-CM | POA: Diagnosis not present

## 2020-10-02 NOTE — Patient Instructions (Signed)
Your labs showed a little bit of elevation in creatinine (which we use to check your kidneys), we will consider repeating this lab at your next visit.  For disability paperwork, you will need to talk with your psychiatrist. The paperwork may need to come from DSS to get the process started but I would check with behavioral fist.

## 2020-10-02 NOTE — Progress Notes (Signed)
Subjective:    Patient ID: Jake Neal, male    DOB: 1990/08/07, 30 y.o.   MRN: [id number]   CC: New Patient  HPI: Patient has no current concerns right now, he is accompanied by his aunt who is also his caretaker.  He states that he is here because his aunt wanted him to come get established with a PCP.  Patient has a history of schizophrenia and is currently being seen by behavioral health specialist.  He does not feel like the medications have changed much better and does notice quite a significant improvement in his communication ability.  Patient does state that sometimes he hears people talking to him, but later on he interview then denied and said they were his own thoughts.  And states that when he has acute worsening of his mental stability he thinks that she is not his real aunt and does not know who his cousins are.   And is wanting to get him a disability form because he is not able to work due to his mental state and that when he is attempted working in the past he becomes very paranoid of everyone around him as well as his surroundings.  Patient does have significant depression and reports that he does have passive suicidal ideation about not wanting to wake up, reports that he does not have any active plan and does not hear any voices that tell him to do bad things.   PMHx: Past Medical History:  Diagnosis Date  . Anxiety   . Depression   . Medical history non-contributory   . Schizophrenia (HCC)      Surgical Hx: No past surgical history on file.   Family Hx: Patient states that he is unsure of his family history   Social Hx: Current Social History    Who lives at home: Aunt (25), Cousin (25), Cousin's child (63mo)   Who would speak for you about health care matters: [name] Midwife)   Transportation: Bus or take by his cousin or aunt by car  Important Relationships & Pets: Dog onyx 1.5yo   Current Stressors: Mental stressors including depression   Work / Programme researcher, broadcasting/film/video:  None, pt cannot work secondary to mental health  Religious / Personal Beliefs: None  Interests / Fun: Nothing     Medications: Benztropine 2 mg twice daily Dicyclomine 20 mg twice daily Haloperidol 5 mg twice daily Olanzapine 5 mg nightly  ROS:  Man:  Patient reports no  vision/ hearing changes,anorexia, weight change, fever ,adenopathy, persistant / recurrent hoarseness, swallowing issues, chest pain, edema,persistant / recurrent cough, hemoptysis, dyspnea(rest, exertional, paroxysmal nocturnal), gastrointestinal  bleeding (melena, rectal bleeding), abdominal pain, excessive heart burn, GU symptoms(dysuria, hematuria, pyuria, voiding/incontinence  Issues) syncope, focal weakness, severe memory loss, concerning skin lesions, depression, anxiety, abnormal bruising/bleeding, major joint swelling.     Review of Systems   Objective:  BP 98/62   Pulse 80   Ht 6\' 1"  (1.854 m)   Wt 153 lb (69.4 kg)   SpO2 98%   BMI 20.19 kg/m  Vitals and nursing note reviewed  General: well nourished, in no acute distress HEENT: normocephalic, TM's visualized bilaterally, no scleral icterus or conjunctival pallor, no nasal discharge, moist mucous membranes, good dentition without erythema or discharge noted in posterior oropharynx Neck: supple, non-tender, without lymphadenopathy Cardiac: RRR, clear S1 and S2, no murmurs, rubs, or gallops Respiratory: clear to auscultation bilaterally, no increased work of breathing Abdomen: soft, minimal tenderness to palpation in RUQ, nondistended,  no masses or organomegaly. Bowel sounds present Extremities: no edema or cyanosis. Warm, well perfused. 2+ radial and PT pulses bilaterally Skin: warm and dry, no rashes noted Neuro: alert and oriented, no focal deficits Psych: Flat affect, minimal intonation of voice, patient somewhat hesitant to divulge information during interview  Assessment & Plan:   Schizophrenia, paranoid type Patient is  currently being followed by behavioral specialist regularly and current medications include benztropine 2 mg twice daily, healthy adult milligrams twice daily, olanzapine 5 mg nightly.  Patient's current symptoms are somewhat controlled per family.  Patient and family would like disability paperwork for mental health status -Continue current medications -Continue to follow with behavioral health -Instructed to obtain disability forms from psychiatrist as this is a mental health disorder order to get paperwork from DSS.  MDD Patient PHQ-9 today of 22 with a positive #9.  Patient has passive SI with no intention no personal protective factors easily identified but patient was supportive family.  From discussion seems like patient likely has hallucinations, states that there is no intention to harm himself. -Patient currently following with behavioral health, encouraged to continue close regular contact  Elevated creatinine Elevated to 1.31 on last check on 09/04/2020.  Baseline appears to be around 1.04, patient is currently on antipsychotics.  We will recheck a BMP at the next visit to monitor creatinine. -BMP at next visit  Return in about 4 weeks (around 10/30/2020) for Kidney function.   Evelena Leyden, DO, PGY-1

## 2020-10-24 ENCOUNTER — Encounter (HOSPITAL_COMMUNITY): Payer: Self-pay | Admitting: Physician Assistant

## 2020-10-24 ENCOUNTER — Ambulatory Visit (INDEPENDENT_AMBULATORY_CARE_PROVIDER_SITE_OTHER): Payer: Medicaid Other | Admitting: Physician Assistant

## 2020-10-24 ENCOUNTER — Other Ambulatory Visit: Payer: Self-pay

## 2020-10-24 VITALS — BP 109/72 | HR 69 | Ht 73.0 in | Wt 157.0 lb

## 2020-10-24 DIAGNOSIS — F411 Generalized anxiety disorder: Secondary | ICD-10-CM | POA: Diagnosis not present

## 2020-10-24 DIAGNOSIS — F200 Paranoid schizophrenia: Secondary | ICD-10-CM

## 2020-10-24 MED ORDER — OLANZAPINE 5 MG PO TABS: 5.0000 mg | ORAL_TABLET | Freq: Every day | ORAL | 1 refills | Status: DC

## 2020-10-24 NOTE — Progress Notes (Signed)
BH MD/PA/NP OP Progress Note  10/24/2020 4:14 PM Jake Neal  MRN:  [id number]  Chief Complaint:  Chief Complaint    Medication Management     HPI:   Jake Neal is a 30 year old male with a past psychiatric history significant for schizophrenia (paranoid type), generalized anxiety disorder, and PTSD who [facility] follow-up and medication management.  Patient is accompanied by his aunt during the encounter.  Patient is currently being managed on the following medications:  Haldol 5 mg 2 times daily Benztropine 2 mg 2 times daily  Per patient's Aunt, patient has not been doing well and has not been talking a lot.  She states that the patient is not always compliant on his medications and will rarely take his medications when prompted by her.  She states that when she is unable to get the patient to take his medications, she relies on her husband or the patient's cousin to get him to take his medications.  Patient's aunt is interested in setting the patient up with an injectable antipsychotic so that he won't miss his dosage.  She reports that she is trying to set up the patient with disability and he is currently set up with [facility].  Patient's aunt stepped out during the latter portion of the encounter.  Patient was asked why he is not taking his medications to which he replied he replied that he is taking his medications and sometimes he misses his dosages at night.  Patient reports been experiencing the following symptoms: decreased energy and lack of alertness.  Patient states that his mood has been 50/50 as of late and he does not remember the last time he was normal.  Patient was asked if he was interested in being placed on an injectable antipsychotic for the management of his symptoms to which the patient.  Patient states that he has a past history of being placed on an injectable and states that  he has never been the same ever since.  Patient's expressed that he feels more comfortable with taking oral medications and states that he will continue to take his medications as scheduled.  Provider recommended that the patient be placed on olanzapine 5 mg at bedtime for the management of his symptoms.  Patient was open to taking the medication.  A PHQ 9 screen was performed with the patient scoring a 19.  A GAD-7 screen was performed with the patient scoring a 4.  Patient is calm, cooperative, and engaged in conversation during the encounter.  Although patient is mostly quiet, he answers all questions addressed to him.  Patient reports that his mood is down and that he has a lot on his mind.  When asked to elaborate, patient was unable to explain why his mood was down.  Patient denies suicidal ideations.  When asked about homicidal ideations, patient stated "I don't know how to answer that."  Patient denies active auditory or visual hallucinations.  Patient endorses poor sleep and states that he has been staying up till 1.  Patient endorses fair appetite and states that he has been eating more.  He states that he is not supposed to be eating.  When asked if there was anyone telling him he should not be eating, patient was unable to answer.  Patient denies alcohol use, tobacco use, and illicit drug use.  Visit Diagnosis:    ICD-10-CM   1. Schizophrenia, paranoid type (  HCC)  F20.0   2. Generalized anxiety disorder  F41.1     Past Psychiatric History:  Schizophrenia, paranoid type Generalized anxiety disorder PTSD  Past Medical History:  Past Medical History:  Diagnosis Date  . Anxiety   . Depression   . Medical history non-contributory   . Schizophrenia (HCC)    No past surgical history on file.  Family Psychiatric History:  Unknown  Family History: No family history on file.  Social History:  Social History   Socioeconomic History  . Marital status: Single    Spouse name: Not on  file  . Number of children: Not on file  . Years of education: Not on file  . Highest education level: Not on file  Occupational History  . Not on file  Tobacco Use  . Smoking status: Former Games developer  . Smokeless tobacco: Never Used  Substance and Sexual Activity  . Alcohol use: No  . Drug use: Yes    Types: Marijuana    Comment: Currently denying substance use  . Sexual activity: Yes    Birth control/protection: None  Other Topics Concern  . Not on file  Social History Narrative  . Not on file   Social Determinants of Health   Financial Resource Strain: Not on file  Food Insecurity: Not on file  Transportation Needs: Not on file  Physical Activity: Not on file  Stress: Not on file  Social Connections: Not on file    Allergies: No Known Allergies  Metabolic Disorder Labs: Lab Results  Component Value Date   HGBA1C 5.0 10/22/2019   MPG 96.8 10/22/2019   MPG 93.93 08/03/2018   Lab Results  Component Value Date   PROLACTIN 9.9 07/06/2016   PROLACTIN 14.0 07/04/2016   Lab Results  Component Value Date   CHOL 219 (H) 10/22/2019   TRIG 103 10/22/2019   HDL 61 10/22/2019   CHOLHDL 3.6 10/22/2019   VLDL 21 10/22/2019   LDLCALC 137 (H) 10/22/2019   LDLCALC 138 (H) 08/03/2018   Lab Results  Component Value Date   TSH 1.564 10/22/2019   TSH 1.961 02/06/2019    Therapeutic Level Labs: No results found for: LITHIUM No results found for: VALPROATE No components found for:  CBMZ  Current Medications: Current Outpatient Medications  Medication Sig Dispense Refill  . benztropine (COGENTIN) 2 MG tablet Take 1 tablet (2 mg total) by mouth 2 (two) times daily. 60 tablet 1  . dicyclomine (BENTYL) 20 MG tablet Take 1 tablet (20 mg total) by mouth 2 (two) times daily. 20 tablet 0  . haloperidol (HALDOL) 5 MG tablet Take 1 tablet (5 mg total) by mouth 2 (two) times daily. 60 tablet 1  . OLANZapine (ZYPREXA) 5 MG tablet Take 5 mg by mouth at bedtime.     No current  facility-administered medications for this visit.     Musculoskeletal: Strength & Muscle Tone: within normal limits Gait & Station: normal Patient leans: N/A  Psychiatric Specialty Exam: Review of Systems  Psychiatric/Behavioral: Positive for behavioral problems and sleep disturbance. Negative for decreased concentration, dysphoric mood, hallucinations, self-injury and suicidal ideas. The patient is not nervous/anxious and is not hyperactive.     Blood pressure 109/72, pulse 69, height 6\' 1"  (1.854 m), weight 157 lb (71.2 kg).Body mass index is 20.71 kg/m.  General Appearance: Fairly Groomed  Eye Contact:  Good  Speech:  Clear and Coherent and Normal Rate  Volume:  Normal  Mood:  Depressed  Affect:  Blunt, Depressed and  Restricted  Thought Process:  Coherent and Descriptions of Associations: Loose  Orientation:  Full (Time, Place, and Person)  Thought Content: Illogical and Ideas of Reference:   Paranoia   Suicidal Thoughts:  No  Homicidal Thoughts:  No  Memory:  Immediate;   Fair Recent;   Fair Remote;   Fair  Judgement:  Impaired  Insight:  Lacking  Psychomotor Activity:  Normal  Concentration:  Concentration: Fair and Attention Span: Good  Recall:  Fair  Fund of Knowledge: Poor  Language: Good  Akathisia:  NA  Handed:  Right  AIMS (if indicated): not done  Assets:  Communication Skills Desire for Improvement Housing Social Support  ADL's:  Impaired  Cognition: Impaired,  Mild  Sleep:  Poor   Screenings: AIMS   Flowsheet Row Admission (Discharged) from 02/28/2020 in [facility] Ad[facility] (Discharged) from [facility] Admission (Discharged) from OP Visit from [facility] Admission (Discharged) from OP Visit from 02/04/2019 [facility]   AIMS Total Score 0 0 0 0 0    AUDIT   Flowsheet Row Admission (Discharged) from 02/28/2020 in [facility] Ad[facility] ([facility]charged) [facility] Admission (Discharged) from OP Visit from [facility] Admission (Discharged) from OP Visit from 02/04/2019 [facility] (Dis[facility]  Alcohol Use Disorder Identification Test Final Score (AUDIT) 1 0 0 2 0    GAD-7   Flowsheet Row Office Visit from 09/12/2020 in [facility] Office Visit from 08/01/2020 in [facility]  Total GAD-7 Score 4 21    PHQ2-9   Flowsheet Row Office Visit from 10/24/2020 in [facility] Office Visit from 10/02/2020 in [facility] Office Visit from 09/12/2020 in [facility] Office Visit from 08/01/2020 in [facility] ED from 06/04/2020 in [facility]  PHQ-2 Total Score 5 6 6 2 3   PHQ-9 Total Score 19 22 18 18 9     Flowsheet Row Office Visit from 10/24/2020 in [facility] Office Visit from 09/12/2020 in [facility] Office Visit from 08/01/2020 in [facility]  C-SSRS RISK CATEGORY Low Risk Low Risk Low Risk       Assessment and Plan:   Reg A. Morad is a 30 year old male with a past psychiatric history significant for schizophrenia (paranoid type), generalized anxiety disorder, and PTSD who presents to [facility] for follow-up and medication management.  Patient is accompanied by his aunt during the encounter.  During the encounter, patient's aunt expressed that the patient has not been compliant on his medications.   She reports that whenever she is unable to get him to take his medications, she must rely on her husband or the patient's cousin to get him to take his medications.  Patient reports that he sometimes misses his doses at night but states that he has been taking his medications.  Patient denies wanting to be placed on an injectable antipsychotic for the management of his symptoms.  During the encounter, patient exhibited paranoid thought exemplified by not being able to explain why he felt that  he should not be eating.  Patient was recommended olanzapine 5 mg at bedtime for management of his current symptoms.  Patient stated that he would take the medication as scheduled.  Patient's medication to be prescribed to pharmacy of choice.  1. Schizophrenia, paranoid type (HCC) Patient to continue taking Haldol 5 mg 2 times daily for the management of his schizophrenia Patient to continue taking benztropine 2 mg 2 times daily  - OLANZapine (ZYPREXA) 5 MG tablet; Take 1 tablet (5 mg total) by mouth at bedtime.  Dispense: 30 tablet; Refill: 1  2. Generalized anxiety disorder  Patient to follow up in 6 weeks  Meta Hatchet, PA 10/24/2020, 4:14 PM

## 2020-10-26 ENCOUNTER — Other Ambulatory Visit: Payer: Self-pay

## 2020-10-26 ENCOUNTER — Ambulatory Visit (HOSPITAL_COMMUNITY)
Admission: EM | Admit: 2020-10-26 | Discharge: 2020-10-27 | Disposition: A | Discharge status: Home / Self Care | Payer: Medicaid Other | Attending: Student in an Organized Health Care Education/Training Program | Admitting: Student in an Organized Health Care Education/Training Program

## 2020-10-26 DIAGNOSIS — Z79899 Other long term (current) drug therapy: Secondary | ICD-10-CM | POA: Insufficient documentation

## 2020-10-26 DIAGNOSIS — F1290 Cannabis use, unspecified, uncomplicated: Secondary | ICD-10-CM | POA: Insufficient documentation

## 2020-10-26 DIAGNOSIS — Z20822 Contact with and (suspected) exposure to covid-19: Secondary | ICD-10-CM | POA: Insufficient documentation

## 2020-10-26 DIAGNOSIS — F200 Paranoid schizophrenia: Secondary | ICD-10-CM | POA: Insufficient documentation

## 2020-10-26 DIAGNOSIS — Z87891 Personal history of nicotine dependence: Secondary | ICD-10-CM | POA: Insufficient documentation

## 2020-10-26 LAB — LIPID PANEL
Cholesterol: 234 mg/dL — ABNORMAL HIGH (ref 0–200)
HDL: 71 mg/dL (ref >40)
LDL Cholesterol: 153 mg/dL — ABNORMAL HIGH (ref 0–99)
Total CHOL/HDL Ratio: 3.3 RATIO
Triglycerides: 52 mg/dL (ref <150)
VLDL: 10 mg/dL (ref 0–40)

## 2020-10-26 LAB — CBC WITH DIFFERENTIAL/PLATELET
Abs Immature Granulocytes: 0.01 10*3/uL (ref 0.00–0.07)
Basophils Absolute: 0 10*3/uL (ref 0.0–0.1)
Basophils Relative: 0 %
Eosinophils Absolute: 0.1 10*3/uL (ref 0.0–0.5)
Eosinophils Relative: 1 %
HCT: 41.6 % (ref 39.0–52.0)
Hemoglobin: 13.8 g/dL (ref 13.0–17.0)
Immature Granulocytes: 0 %
Lymphocytes Relative: 30 %
Lymphs Abs: 1.6 10*3/uL (ref 0.7–4.0)
MCH: 31.7 pg (ref 26.0–34.0)
MCHC: 33.2 g/dL (ref 30.0–36.0)
MCV: 95.6 fL (ref 80.0–100.0)
Monocytes Absolute: 0.6 10*3/uL (ref 0.1–1.0)
Monocytes Relative: 12 %
Neutro Abs: 2.9 10*3/uL (ref 1.7–7.7)
Neutrophils Relative %: 57 %
Platelets: UNDETERMINED 10*3/uL (ref 150–400)
RBC: 4.35 MIL/uL (ref 4.22–5.81)
RDW: 14.4 % (ref 11.5–15.5)
WBC: 5.2 10*3/uL (ref 4.0–10.5)
nRBC: 0 % (ref 0.0–0.2)

## 2020-10-26 LAB — COMPREHENSIVE METABOLIC PANEL
ALT: 23 U/L (ref 0–44)
AST: 30 U/L (ref 15–41)
Albumin: 4.3 g/dL (ref 3.5–5.0)
Alkaline Phosphatase: 56 U/L (ref 38–126)
Anion gap: 11 (ref 5–15)
BUN: 12 mg/dL (ref 6–20)
CO2: 26 mmol/L (ref 22–32)
Calcium: 9.4 mg/dL (ref 8.9–10.3)
Chloride: 98 mmol/L (ref 98–111)
Creatinine, Ser: 1.05 mg/dL (ref 0.61–1.24)
GFR, Estimated: >60 mL/min (ref >60)
Glucose, Bld: 91 mg/dL (ref 70–99)
Potassium: 3.5 mmol/L (ref 3.5–5.1)
Sodium: 135 mmol/L (ref 135–145)
Total Bilirubin: 2.9 mg/dL — ABNORMAL HIGH (ref 0.3–1.2)
Total Protein: 7.4 g/dL (ref 6.5–8.1)

## 2020-10-26 LAB — TSH: TSH: 3.799 u[IU]/mL (ref 0.350–4.500)

## 2020-10-26 LAB — RESP PANEL BY RT-PCR (FLU A&B, COVID) ARPGX2
Influenza A by PCR: NEGATIVE
Influenza B by PCR: NEGATIVE
SARS Coronavirus 2 by RT PCR: NEGATIVE

## 2020-10-26 LAB — POC SARS CORONAVIRUS 2 AG -  ED: SARS Coronavirus 2 Ag: NEGATIVE

## 2020-10-26 MED ORDER — HYDROXYZINE HCL 25 MG PO TABS: 25.0000 mg | ORAL_TABLET | Freq: Three times a day (TID) | ORAL | Status: DC | PRN

## 2020-10-26 MED ORDER — TRAZODONE HCL 50 MG PO TABS: 50.0000 mg | ORAL_TABLET | Freq: Every evening | ORAL | Status: DC | PRN

## 2020-10-26 MED ORDER — MAGNESIUM HYDROXIDE 400 MG/5ML PO SUSP: 30.0000 mL | Freq: Every day | ORAL | Status: DC | PRN

## 2020-10-26 MED ORDER — LORAZEPAM 1 MG PO TABS: 1.0000 mg | ORAL_TABLET | ORAL | Status: DC | PRN

## 2020-10-26 MED ORDER — OLANZAPINE 10 MG PO TABS
10.0000 mg | ORAL_TABLET | Freq: Two times a day (BID) | ORAL | Status: DC
  Administered 2020-10-26 – 2020-10-27 (×2): 10 mg via ORAL
  Filled 2020-10-26: qty 14
  Filled 2020-10-26 (×2): qty 1

## 2020-10-26 MED ORDER — ZIPRASIDONE MESYLATE 20 MG IM SOLR: 20.0000 mg | INTRAMUSCULAR | Status: DC | PRN

## 2020-10-26 MED ORDER — OLANZAPINE 10 MG PO TBDP: 10.0000 mg | ORAL_TABLET | Freq: Three times a day (TID) | ORAL | Status: DC | PRN

## 2020-10-26 MED ORDER — ACETAMINOPHEN 325 MG PO TABS: 650.0000 mg | ORAL_TABLET | Freq: Four times a day (QID) | ORAL | Status: DC | PRN

## 2020-10-26 MED ORDER — ALUM & MAG HYDROXIDE-SIMETH 200-200-20 MG/5ML PO SUSP: 30.0000 mL | ORAL | Status: DC | PRN

## 2020-10-26 NOTE — ED Notes (Signed)
Patient cooperative, calm.  Appropriate eye contact.  Stated he was here because of texts to his aunt.  Patient then clarified and said that he's not sure who that lady is.  Stated he was trying to text someone he thought was his aunt, but it turned out to be this other lady.  Stated he's not sure why he's here.  Denied SI, HI.  "I don't know why they keep saying that."  When asked if he was hearing voices or seeing things, patient stated he wasn't sure how to answer.  Has juice to drink. Denied needing anything else to eat or drink.  Denied needing any medication to help him sleep.

## 2020-10-26 NOTE — BH Assessment (Addendum)
Comprehensive Clinical Assessment (CCA) Note  10/26/2020 Jake Neal [id number] Disposition: Patient was seen today by Dr[facility] Rhea BeltonAlexander Pashayan [facility].  Pt was brought in on IVC taken out by a family member.  Dr. Renaldo FiddlerPashayan recommends inpatient placement.    According to IVC / not taking meds. Per [employer] A[employer]t reports patient threatening to kill himself and sent text message about a serial killer.  [facility] on [facility]C taken out by a family member. Pt denies any SI. He says he has had one attempt in the past. Patient says "that is the worst you can do" when asked about suicide. Patient denies any HI or hearing voices. When asked about visual hallucinations "seeing things" he said that he was blind. He went on to say that he is not physically blind but he is not sure if he is seeing the real person when he looks at them. He tried a few times to explain but said "it was too hard to explain." Pt does say that he knows that he has mental health problems and that he may not think the way other people do. Clinician called pt's aunt. She said that patient has been reaching out to family members that he does not usually talk to saying that someone is after him and that he thinks it is a serial killer. He has been staying up all night and sleeping outside. She said that patient will ask if it is okay to eat. Pt stays with his aunt primarily. Pt is seen by Otila BackEddie Nwoko, [facility]. He had an appointment on 06/01. Patient has [facility] that [facility]. He will sometimes take meds from other family members and is inconsistent. Patient said he did not the medications. Aunt said he has not been brushing teeth or bathing regularly.  Patient has appropriate eye contact.  He is oriented x4.  Pt however says he feels he is "blind" that he is not seeing people for who they might be.  [facility].  Pt is experiencing delusional thoughts that people are  after him and that thoughts are being inserted into his brain. Pt complains of not sleeping well at night.  Appetite is normal.    Pt is seen by [facility]til[facility] BackEddie Nwoko, [facility] and had an appointment on 06/01.  His [facility]  Chief Complaint:  Chief Complaint  Patient presents with  . Suicidal   Visit Diagnosis: Schizophrenia   CCA Screening, Triage and Referral (STR)  Patient Reported Information How did you hear about us? Legal System  Referral name: [name](Phreesia 06/04/2020)  Referral phone number: [phone]   Whom do you see for routine medical problems? I don't have a doctor  Practice/Facility Name: No data recorded Practice/Facility Phone Number: No data recorded Name of Contact: No data recorded Contact Number: No data recorded Contact Fax Number: No data recorded Prescriber Name: No data recorded Prescriber Address (if known): No data recorded  What Is the Reason for Your Visit/Call Today? According to IVC / not taking meds. Per [employer] AUnt reports patient threatening to kill himself  and sent text message about a serial killer  How Long Has This Been Causing You Problems? 1-6 months  What Do You Feel Would Help You the Most Today? Treatment for Depression or other mood problem   Have You Recently Been in Any Inpatient Treatment (Hospital/Detox/Crisis Center/28-Day Program)?  No (Phreesia 06/04/2020)  Name/Location of Program/Hospital:[facility]  How Long Were You There? 4 days  When Were You Discharged? 12/12/2019   Have You Ever Received Services From [employer] Before? Yes (Phreesia 06/04/2020)  Who Do You See at [facility]? NA (Phreesia 06/04/2020)   Have You Recently Had Any Thoughts About Hurting Yourself? No  Are You Planning to Commit Suicide/Harm Yourself At This time? No   Have you Recently Had Thoughts About Hurting Someone [name]? No  Explanation: No data recorded  Have You Used Any Alcohol or Drugs in the Past  24 Hours? No  How Long Ago Did You Use Drugs or Alcohol? No data recorded What Did You Use and How Much? No data recorded  Do You Currently Have a Therapist/Psychiatrist? Yes  Name of Therapist/Psychiatrist: [name], [facility] at [facility]   Have You Been Recently Discharged From Any Office Practice or Programs? No  Explanation of Discharge From Practice/Program: No data recorded    CCA Screening Triage Referral Assessment Type of Contact: Face-to-Face  Is this Initial or Reassessment? Initial Assessment  Date Telepsych consult ordered in [facility]:  02/27/2020  Time Telepsych consult ordered in [facility]:  0933   Patient Reported Information Reviewed? Yes  Patient Left Without Being Seen? No data recorded Reason for Not Completing Assessment: No data recorded  Collateral Involvement: Pt's aunt/IVC petitioner [name], [phone])   Does Patient Have a Court Appointed Legal Guardian? No data recorded Name and Contact of Legal Guardian: No data recorded If Minor and Not Living with Parent(s), Who has Custody? No data recorded Is CPS involved or ever been involved? Never  Is APS involved or ever been involved? Never   Patient Determined To Be At Risk for Harm To Self or Others Based on Review of Patient Reported Information or Presenting Complaint? No  Method: No data recorded Availability of Means: No data recorded Intent: No data recorded Notification Required: No data recorded Additional Information for Danger to Others Potential: No data recorded Additional Comments for Danger to Others Potential: No data recorded Are There Guns or Other Weapons in Your Home? No data recorded Types of Guns/Weapons: No data recorded Are These Weapons Safely Secured?                            No data recorded Who Could Verify You Are Able To Have These Secured: No data recorded Do You Have any Outstanding Charges, Pending Court Dates, Parole/Probation? No data recorded Contacted To  Inform of Risk of Harm To Self or Others: Other: Comment (Per IVC has threatened to kill himself.  Denies this to this clinician.)   Location of Assessment: [facility]   Does Patient Present under Involuntary Commitment? Yes  IVC Papers Initial File Date: 10/26/2020   [place] of Residence: [place]   Patient Currently Receiving the Following Services: Not Receiving Services   Determination of Need: Routine (7 days)   Options For Referral: [facility] (Continuous assessment)     CCA Biopsychosocial Intake/Chief Complaint:  Pt was brought to [facility] on IVC taken out by a family member.  Pt denies any SI.  He says he has had one attempt in the past.  Patient says "that is the worst you can do" when asked about suicide.  Patient denies any HI or hearing voices.  When asked about visual hallucinations "seeing things"  he said that he was blind.  He went on  to say that he is not physically blind but he is not sure if he is seeing the real person when he looks at them.  He tried a few times to explain but said "it was too hard to explain."  Pt does say that he knows that he has mental health problems and that he may not think the way other people do.  Clinician called pt's aunt.  She said that patient has been reaching out to family members that he does not usually talk to saying that someone is after him and that he thinks it is a serial killer.  He has been staying up all night and sleeping outside.  She said that patient will ask if it is okay to eat.  Pt stays with his aunt primarily.  Pt is seen by [name], PA at [facility].  He had an appointment on 06/01.  [facility]  He will sometimes take meds from other family members and is inconsistent.  Patient said he did not the medications.  Aunt said he has not been brushing teeth or bathing regularly.  Current Symptoms/Problems: Pt feeling like someone may be following him.   Patient  Reported Schizophrenia/Schizoaffective Diagnosis in Past: Yes   Strengths: Per pt: "playing ball."  Preferences: No data recorded Abilities: No data recorded  Type of Services Patient Feels are Needed: Pt states 2 months ago he noticed the medications were "actually" helping to organize and slow his thinking "I am thinking everything at once"   Initial Clinical Notes/Concerns: No data recorded  Mental Health Symptoms Depression:  Sleep (too much or little); Difficulty Concentrating; Hopelessness   Duration of Depressive symptoms: Greater than two weeks   Mania:  None   Anxiety:   Worrying; Tension; Difficulty concentrating   Psychosis:  Delusions   Duration of Psychotic symptoms: Less than six months   Trauma:  None   Obsessions:  N/A   Compulsions:  Absent insight/delusional   Inattention:  N/A   Hyperactivity/Impulsivity:  N/A   Oppositional/Defiant Behaviors:  N/A   Emotional Irregularity:  N/A   Other Mood/Personality Symptoms:  No data recorded   Mental Status Exam Appearance and self-care  Stature:  Tall   Weight:  Average weight   Clothing:  Casual   Grooming:  Neglected   Cosmetic use:  None   Posture/gait:  Normal   Motor activity:  Not Remarkable   Sensorium  Attention:  Normal   Concentration:  Scattered   Orientation:  X5   Recall/memory:  Defective in Short-term   Affect and Mood  Affect:  Appropriate   Mood:  Depressed   Relating  Eye contact:  Normal   Facial expression:  Responsive   Attitude toward examiner:  Guarded   Thought and Language  Speech flow: Soft   Thought content:  Appropriate to Mood and Circumstances   Preoccupation:  None   Hallucinations:  Visual   Organization:  No data recorded  [employer] of Knowledge:  Fair   Intelligence:  Average   Abstraction:  Normal   Judgement:  Fair   Reality Testing:  Unaware   Insight:  Fair   Decision Making:  Only simple   Social  Functioning  Social Maturity:  Isolates   Social Judgement:  Normal   Stress  Stressors:  Relationship   Coping Ability:  Normal   Skill Deficits:  Intellect/education   Supports:  Family     Religion:  Leisure/Recreation:    Exercise/Diet: Exercise/Diet Have You Gained or Lost A Significant Amount of Weight in the Past Six Months?: No Do You Have Any Trouble Sleeping?: Yes Explanation of Sleeping Difficulties: Pt feels he is not getting enough rest.   CCA Employment/Education Employment/Work Situation: Employment / Work Situation Employment situation: Unemployed What is the longest time patient has a held a job?: UTA Where was the patient employed at that time?: [employer] Has patient ever been in the [employer]?: No  Education: Education Did Garment/textile technologist From [employer]?: No Did Theme park manager?: No Did Designer, television/film set?: No Did You Have An Individualized Education Program (IIEP): No Did You Have Any Difficulty At [employer]?: No   CCA Family/Childhood History Family and Relationship History: Family history Marital status: Single Does patient have children?: No  Childhood History:  Childhood History By whom was/is the patient raised?: Mother,Other (Comment) (other extended family) Did patient suffer any verbal/emotional/physical/sexual abuse as a child?: No Has patient ever been sexually abused/assaulted/raped as an adolescent or adult?: No Witnessed domestic violence?: No Has patient been affected by domestic violence as an adult?: No  Child/Adolescent Assessment:     CCA Substance Use Alcohol/Drug Use: Alcohol / Drug Use Pain Medications: SEE MAR Prescriptions: SEE MAR Over the Counter: SEE MAR History of alcohol / drug use?: No history of alcohol / drug abuse                         ASAM's:  Six Dimensions of Multidimensional Assessment  Dimension 1:  Acute Intoxication and/or Withdrawal Potential:      Dimension 2:   Biomedical Conditions and Complications:      Dimension 3:  Emotional, Behavioral, or Cognitive Conditions and Complications:     Dimension 4:  Readiness to Change:     Dimension 5:  Relapse, Continued use, or Continued Problem Potential:     Dimension 6:  Recovery/Living Environment:     ASAM Severity Score:    ASAM Recommended Level of Treatment:     Substance use Disorder (SUD)    Recommendations for Services/Supports/Treatments:    DSM5 Diagnoses: Patient Active Problem List   Diagnosis Date Noted  . Major depressive disorder with single episode 07/03/2020  . Generalized anxiety disorder 06/27/2020  . Schizophrenia (HCC) 02/28/2020  . Schizophrenia, paranoid type (HCC) 02/04/2019  . Chronic post-traumatic stress disorder (PTSD) 01/28/2019    Patient Centered Plan: Patient is on the following Treatment Plan(s):  Anxiety and Depression   Referrals to Alternative Service(s): Referred to Alternative Service(s):   Place:   Date:   Time:    Referred to Alternative Service(s):   Place:   Date:   Time:    Referred to Alternative Service(s):   Place:   Date:   Time:    Referred to Alternative Service(s):   Place:   Date:   Time:     Wandra Mannan

## 2020-10-26 NOTE — ED Notes (Signed)
Patient appears to be sleeping.  RR even and unlabored.  Continue to monitor for safety. 

## 2020-10-26 NOTE — BH Assessment (Signed)
Patient presents IVC's from Aunt . IVC stated that patient is not taking his medications. Per [employer] Aunt reports that he is threatening to kill himself and a text message talking about a serial killer . Patient denies SI/ HI/ AVH or any substance use . Patient advised that he does not know why he is here , " They just told me to come with them". Writer reached out to [employer] and was unable reach her via telephone . Mertie Clause Chilton Si [phone]). Patient is routine.    Writer was able to speak with Aunt she stated he had an appointment with Eddie on Wednesday and since has refused his medications, to take a shower and is texting his friends saying someone is a serial killer . Aunt says he is not physically aggressive towards her ,but she is his trigger and when he gets frustrated he blames her for the deaths of his family. Aunt stated she is not his guardian so she cant force him to take his medication but she is the only family member who is trying to help him. Aunt advised she told Link Snuffer of her concerns on Wednesday but did not report any changes in behavior to [place] since then.

## 2020-10-26 NOTE — BH Assessment (Addendum)
Dr. Renaldo Fiddler recommends inpatient placement. Patient faxed out to multiple facilities for consideration of bed placement   [facility] Details  [facility] Details  [facility]  [facility] Details  [facility]  [facility] Details  [facility] Details  [facility] Details  [facility] Details  [facility] Details  [facility] Details  [facility] Details  [facility] Details  [facility]  [facility] Details  [facility] Details  [facility] Details  [facility] Details  [facility] Details  [facility] Details  [facility] Details  [facility] Details  [facility] Details  [facility]

## 2020-10-26 NOTE — ED Provider Notes (Addendum)
Behavioral Health Admission H&P [facility] & OBS)  Date: 10/26/20 Patient Name: Jake Neal MRN: [id number] Chief Complaint:  Chief Complaint  Patient presents with  . Urgent Emergent Eval IVC      Diagnoses:  Final diagnoses:  None    HPI: Patient is a 30 yr old male who presents under IVC for not taking his medications and reportedly threatening to kill himself. He has a PPHx of Paranoid Schizophrenia.  When asked why he was brought to the hospital he reports he does not know why the police brought him. When told it was because his Aunt took out IVC paperwork he states he does not know who that lady is but she is not his aunt. He states that he has lived with her for about a year now but he does not know who she is. When asked if he threatened to kill himself he denies it. When asked if he accused his aunt of being the reason multiple family members are dead he said that this happened a while ago the last time he was IVC'd. He reports that he has been seeing Santiago Bur but that he has not been taking his medications as prescribed. When asked why he was taking his medications inconsistently he could not answer. He reports that he has had issues with sedation in his medications in the past.  When asked what has been going on recently he said he does not know. He said that he has been thinking things. He tries to get over things spiritually but states he keeps thinking about them. He reports no SI, HI, or AH. He reports that he does see things but when asked to elaborate he is unable to. He reports that he thinks he is being watched. He reports thinking that others can read his thoughts. He reports that he thinks thoughts can be implanted into his head specifically the government.    PHQ 2-9:  [employer] Office Visit from 10/24/2020 in [facility] Office Visit from 10/02/2020 in [facility] Office Visit from 09/12/2020 in [facility]  Thoughts that you would be better off dead, or of hurting yourself in some way Not at all Several days Several days  PHQ-9 Total Score 19 22 18       Flowsheet Row Office Visit from 10/24/2020 in [facility] Office Visit from 09/12/2020 in [facility] Office Visit from 08/01/2020 in [facility]  C-SSRS RISK CATEGORY Low Risk Low Risk Low Risk       Total Time spent with patient: 30 minutes  Musculoskeletal  Strength & Muscle Tone: within normal limits Gait & Station: normal Patient leans: N/A  Psychiatric Specialty Exam  Presentation General Appearance: Appropriate for Environment; Casual  Eye Contact:Good  Speech:Clear and Coherent; Normal Rate  Speech Volume:Normal  Handedness:Right   Mood and Affect  Mood:Depressed  Affect:Restricted   Thought Process  Thought Processes:Coherent; Linear  Descriptions of Associations:Intact  Orientation:Full (Time, Place and Person)  Thought Content:Paranoid Ideation  Diagnosis of Schizophrenia or Schizoaffective disorder in past: Yes   Hallucinations:No data recorded Ideas of Reference:None  Suicidal Thoughts:No data recorded Homicidal Thoughts:No data recorded  Sensorium  Memory:Immediate Fair; Recent Good; Remote Fair  Judgment:Intact  Insight:Present   Executive Functions  Concentration:Fair  Attention Span:Good  Recall:Fair  Fund of Knowledge:Fair  Language:Good   Psychomotor Activity  Psychomotor Activity:No data recorded  Assets  Assets:Communication  Skills; Desire for Improvement; Physical Health; Housing; Social Support   Sleep  Sleep:No data recorded  No data recorded  Physical Exam Vitals and nursing note reviewed.  Constitutional:      General: He is not in acute distress.    Appearance: Normal appearance. He is normal weight. He is not ill-appearing or toxic-appearing.  HENT:     Head:  Normocephalic and atraumatic.  Cardiovascular:     Rate and Rhythm: Normal rate.  Pulmonary:     Effort: Pulmonary effort is normal.  Musculoskeletal:        General: Normal range of motion.  Neurological:     Mental Status: He is alert.    Review of Systems  Constitutional: Negative for chills.  Respiratory: Negative for cough and shortness of breath.   Cardiovascular: Negative for chest pain.  Neurological: Negative for seizures and headaches.  Psychiatric/Behavioral: Positive for hallucinations. Negative for depression and suicidal ideas. The patient is nervous/anxious.     Blood pressure 137/81, pulse 80, temperature 98.7 F (37.1 C), temperature source Oral, resp. rate 16, SpO2 96 %. There is no height or weight on file to calculate BMI.  Past Psychiatric History: Schizophrenia, multiple hospitalizations   Is the patient at risk to self? No  Has the patient been a risk to self in the past 6 months? No .    Has the patient been a risk to self within the distant past? No   Is the patient a risk to others? No   Has the patient been a risk to others in the past 6 months? No   Has the patient been a risk to others within the distant past? No   Past Medical History:  Past Medical History:  Diagnosis Date  . Anxiety   . Depression   . Medical history non-contributory   . Schizophrenia (HCC)    No past surgical history on file.  Family History: No family history on file.  Social History:  Social History   Socioeconomic History  . Marital status: Single    Spouse name: Not on file  . Number of children: Not on file  . Years of education: Not on file  . Highest education level: Not on file  Occupational History  . Not on file  Tobacco Use  . Smoking status: Former Games developer  . Smokeless tobacco: Never Used  Substance and Sexual Activity  . Alcohol use: No  . Drug use: Yes    Types: Marijuana    Comment: Currently denying substance use  . Sexual activity: Yes     Birth control/protection: None  Other Topics Concern  . Not on file  Social History Narrative  . Not on file   Social Determinants of Health   Financial Resource Strain: Not on file  Food Insecurity: Not on file  Transportation Needs: Not on file  Physical Activity: Not on file  Stress: Not on file  Social Connections: Not on file  Intimate Partner Violence: Not on file    SDOH:  SDOH Screenings   Alcohol Screen: Low Risk   . Last Alcohol Screening Score (AUDIT): 1  Depression (PHQ2-9): Medium Risk  . PHQ-2 Score: 19  Financial Resource Strain: Not on file  Food Insecurity: Not on file  Housing: Not on file  Physical Activity: Not on file  Social Connections: Not on file  Stress: Not on file  Tobacco Use: Medium Risk  . Smoking Tobacco Use: Former Smoker  . Smokeless Tobacco Use: Never Used  Transportation Needs: Not on file    Last Labs:  Admission on 09/04/2020, Discharged on 09/04/2020  Component Date Value Ref Range Status  . Lipase 09/04/2020 26  11 - 51 U/L Final   Performed at [facility], [address] [place], [place] [address]  . Sodium 09/04/2020 138  135 - 145 mmol/L Final  . Potassium 09/04/2020 3.7  3.5 - 5.1 mmol/L Final  . Chloride 09/04/2020 103  98 - 111 mmol/L Final  . CO2 09/04/2020 21* 22 - 32 mmol/L Final  . Glucose, Bld 09/04/2020 86  70 - 99 mg/dL Final   Glucose reference range applies only to samples taken after fasting for at least 8 hours.  . BUN 09/04/2020 8  6 - 20 mg/dL Final  . Creatinine, Ser 09/04/2020 1.31* 0.61 - 1.24 mg/dL Final  . Calcium 60/45/409804/04/2021 9.9  8.9 - 10.3 mg/dL Final  . Total Protein 09/04/2020 7.9  6.5 - 8.1 g/dL Final  . Albumin 11/91/478204/04/2021 4.6  3.5 - 5.0 g/dL Final  . AST 95/62/130804/04/2021 15  15 - 41 U/L Final  . ALT 09/04/2020 11  0 - 44 U/L Final  . Alkaline Phosphatase 09/04/2020 54  38 - 126 U/L Final  . Total Bilirubin 09/04/2020 2.4* 0.3 - 1.2 mg/dL Final  . GFR, Estimated 09/04/2020 >60  >60 mL/min  Final   Comment: (NOTE) Calculated using the CKD-EPI Creatinine Equation (2021)   . Anion gap 09/04/2020 14  5 - 15 Final   Performed at [facility], [address] [place], [place] [address]  . WBC 09/04/2020 4.4  4.0 - 10.5 K/uL Final  . RBC 09/04/2020 4.55  4.22 - 5.81 MIL/uL Final  . Hemoglobin 09/04/2020 14.5  13.0 - 17.0 g/dL Final  . HCT 69/62/952804/04/2021 42.6  39.0 - 52.0 % Final  . MCV 09/04/2020 93.6  80.0 - 100.0 fL Final  . MCH 09/04/2020 31.9  26.0 - 34.0 pg Final  . MCHC 09/04/2020 34.0  30.0 - 36.0 g/dL Final  . RDW 41/32/440104/04/2021 13.0  11.5 - 15.5 % Final  . Platelets 09/04/2020 156  150 - 400 K/uL Final   REPEATED TO VERIFY  . nRBC 09/04/2020 0.0  0.0 - 0.2 % Final   Performed at [facility], [address] [place], [place] [address]  . Color, Urine 09/04/2020 AMBER* YELLOW Final   BIOCHEMICALS MAY BE AFFECTED BY COLOR  . APPearance 09/04/2020 HAZY* CLEAR Final  . Specific Gravity, Urine 09/04/2020 1.032* 1.005 - 1.030 Final  . pH 09/04/2020 6.0  5.0 - 8.0 Final  . Glucose, UA 09/04/2020 NEGATIVE  NEGATIVE mg/dL Final  . Hgb urine dipstick 09/04/2020 NEGATIVE  NEGATIVE Final  . Bilirubin Urine 09/04/2020 SMALL* NEGATIVE Final  . Ketones, ur 09/04/2020 80* NEGATIVE mg/dL Final  . Protein, ur 36/64/403404/04/2021 100* NEGATIVE mg/dL Final  . Nitrite 74/25/956304/04/2021 NEGATIVE  NEGATIVE Final  . Glori LuisLeukocytes,Ua 09/04/2020 NEGATIVE  NEGATIVE Final  . RBC / HPF 09/04/2020 0-5  0 - 5 RBC/hpf Final  . WBC, UA 09/04/2020 0-5  0 - 5 WBC/hpf Final  . Bacteria, UA 09/04/2020 RARE* NONE SEEN Final  . Mucus 09/04/2020 PRESENT   Final  . Hyaline Casts, UA 09/04/2020 PRESENT   Final   Performed at [facility], [address] [place], [place] [address]    Allergies: Patient has no known allergies.  PTA Medications: (Not in a hospital admission)   Medical Decision Making  He will  be admitted and will begin looking for placement in an inpatient facility. Will change his Zyprexa  to 10 mg BID.    Recommendations  Based on my evaluation the patient does not appear to have an emergency medical condition.  Lauro Franklin, MD 10/26/20  6:13 PM

## 2020-10-27 MED ORDER — OLANZAPINE 10 MG PO TABS: 10.0000 mg | ORAL_TABLET | Freq: Two times a day (BID) | ORAL | 0 refills | Status: DC

## 2020-10-27 NOTE — ED Notes (Signed)
Discharge instructions provided and Pt stated understanding. Personal belongings returned from locker. Pt alert, orient and ambulatory. Pt escorted to the front lobby. Bus pass provided.

## 2020-10-27 NOTE — Discharge Instructions (Addendum)

## 2020-10-27 NOTE — ED Notes (Signed)
Pt was offered breakfast; declined.

## 2020-10-27 NOTE — ED Provider Notes (Signed)
FBC/OBS ASAP Discharge Summary  Date and Time: 10/27/2020 2:10 PM  Name: Jake Neal  MRN:  [id number]   Discharge Diagnoses:  Final diagnoses:  None    Subjective: Jake Neal states "I am ready to go home."  He reports he would like to ride the bus to his aunts home.  Patient reassessed by nurse practitioner.  He is alert and oriented, answers appropriately.  He is pleasant and cooperative during assessment.  He denies suicidal and homicidal ideations.  He denies auditory and visual hallucinations.  There is no indication that patient is responding to internal stimuli.  He contracts verbally for safety with this Clinical research associate.  Patient has been diagnosed with schizophrenia, generalized anxiety disorder and major depressive disorder.  He is followed by outpatient psychiatry at [facility] behavioral health.  He reports he is compliant with medications.  Patient offered support and encouragement.  He reports readiness to discharge home.  He gives verbal consent to speak with his aunt, Jake Neal [phone].  Spoke with patient's aunt who denies concerns for patient safety.  Jake Neal agrees with plan for patient to ride the bus to her home today.  She reports frustration that patient has not "washed his hair in 2 years and he refuses to wear  the new sneakers I bought him."  She reports Jake Neal takes his medication when administered by his cousin, who is visiting [place] this week, cousin to return home in two days time.  When Jake Neal attempts to administer patient's medication he makes excuses for reasons he cannot take the medicine.  Examples include "I need to eat something or I will take it later."   Stay Summary:  HPI 10/26/2020: Patient is a 30 yr old male who presents under IVC for not taking his medications and reportedly threatening to kill himself. He has a PPHx of Paranoid Schizophrenia.   When asked why he was brought to the hospital he reports he does not know why the police brought  him. When told it was because his Aunt took out IVC paperwork he states he does not know who that lady is but she is not his aunt. He states that he has lived with her for about a year now but he does not know who she is. When asked if he threatened to kill himself he denies it. When asked if he accused his aunt of being the reason multiple family members are dead he said that this happened a while ago the last time he was IVC'd. He reports that he has been seeing Jake Neal but that he has not been taking his medications as prescribed. When asked why he was taking his medications inconsistently he could not answer. He reports that he has had issues with sedation in his medications in the past.   When asked what has been going on recently he said he does not know. He said that he has been thinking things. He tries to get over things spiritually but states he keeps thinking about them. He reports no SI, HI, or AH. He reports that he does see things but when asked to elaborate he is unable to. He reports that he thinks he is being watched. He reports thinking that others can read his thoughts. He reports that he thinks thoughts can be implanted into his head specifically the government.     Total Time spent with patient: 30 minutes  Past Psychiatric History: Schizophrenia, generalized anxiety disorder, major depressive disorder Past Medical History:  Past Medical  History:  Diagnosis Date  . Anxiety   . Depression   . Medical history non-contributory   . Schizophrenia (HCC)    No past surgical history on file. Family History: No family history on file. Family Psychiatric History: None reported Social History:  Social History   Substance and Sexual Activity  Alcohol Use No     Social History   Substance and Sexual Activity  Drug Use Yes  . Types: Marijuana   Comment: Currently denying substance use    Social History   Socioeconomic History  . Marital status: Single    Spouse name: Not  on file  . Number of children: Not on file  . Years of education: Not on file  . Highest education level: Not on file  Occupational History  . Not on file  Tobacco Use  . Smoking status: Former Games developer  . Smokeless tobacco: Never Used  Substance and Sexual Activity  . Alcohol use: No  . Drug use: Yes    Types: Marijuana    Comment: Currently denying substance use  . Sexual activity: Yes    Birth control/protection: None  Other Topics Concern  . Not on file  Social History Narrative  . Not on file   Social Determinants of Health   Financial Resource Strain: Not on file  Food Insecurity: Not on file  Transportation Needs: Not on file  Physical Activity: Not on file  Stress: Not on file  Social Connections: Not on file   SDOH:  SDOH Screenings   Alcohol Screen: Low Risk   . Last Alcohol Screening Score (AUDIT): 1  Depression (PHQ2-9): Medium Risk  . PHQ-2 Score: 19  Financial Resource Strain: Not on file  Food Insecurity: Not on file  Housing: Not on file  Physical Activity: Not on file  Social Connections: Not on file  Stress: Not on file  Tobacco Use: Medium Risk  . Smoking Tobacco Use: Former Smoker  . Smokeless Tobacco Use: Never Used  Transportation Needs: Not on file    Has this patient used any form of tobacco in the last 30 days? (Cigarettes, Smokeless Tobacco, Cigars, and/or Pipes) A prescription for an FDA-approved tobacco cessation medication was offered at discharge and the patient refused  Current Medications:  Current Facility-Administered Medications  Medication Dose Route Frequency Provider Last Rate Last Admin  . acetaminophen (TYLENOL) tablet 650 mg  650 mg Oral Q6H PRN Lauro Franklin, MD      . alum & mag hydroxide-simeth (MAALOX/MYLANTA) 200-200-20 MG/5ML suspension 30 mL  30 mL Oral Q4H PRN Lauro Franklin, MD      . hydrOXYzine (ATARAX/VISTARIL) tablet 25 mg  25 mg Oral TID PRN Lauro Franklin, MD      . OLANZapine zydis  (ZYPREXA) disintegrating tablet 10 mg  10 mg Oral Q8H PRN Lauro Franklin, MD       And  . LORazepam (ATIVAN) tablet 1 mg  1 mg Oral PRN Lauro Franklin, MD       And  . ziprasidone (GEODON) injection 20 mg  20 mg Intramuscular PRN Lauro Franklin, MD      . magnesium hydroxide (MILK OF MAGNESIA) suspension 30 mL  30 mL Oral Daily PRN Lauro Franklin, MD      . OLANZapine [facility]) tablet 10 mg  10 mg Oral BID Lauro Franklin, MD   10 mg at 10/27/20 0957  . traZODone (DESYREL) tablet 50 mg  50 mg Oral QHS PRN Pashayan,  Mardelle Matte, MD       Current Outpatient Medications  Medication Sig Dispense Refill  . haloperidol (HALDOL) 5 MG tablet Take 1 tablet (5 mg total) by mouth 2 (two) times daily. 60 tablet 1  . OLANZapine (ZYPREXA) 5 MG tablet Take 1 tablet (5 mg total) by mouth at bedtime. 30 tablet 1    PTA Medications: (Not in a hospital admission)   Musculoskeletal  Strength & Muscle Tone: within normal limits Gait & Station: normal Patient leans: N/A  Psychiatric Specialty Exam  Presentation  General Appearance: Casual  Eye Contact:Good  Speech:Clear and Coherent; Normal Rate  Speech Volume:Normal  Handedness:Right   Mood and Affect  Mood:Euthymic  Affect:Congruent   Thought Process  Thought Processes:Coherent; Goal Directed  Descriptions of Associations:Intact  Orientation:Full (Time, Place and Person)  Thought Content:Logical  Diagnosis of Schizophrenia or Schizoaffective disorder in past: Yes  Duration of Psychotic Symptoms: Less than six months   Hallucinations:Hallucinations: None  Ideas of Reference:None  Suicidal Thoughts:Suicidal Thoughts: No  Homicidal Thoughts:Homicidal Thoughts: No   Sensorium  Memory:Immediate Fair; Recent Fair; Remote Fair  Judgment:Intact  Insight:Fair   Executive Functions  Concentration:Good  Attention Span:Good  Recall:Good  Fund of  Knowledge:Good  Language:Good   Psychomotor Activity  Psychomotor Activity:Psychomotor Activity: Normal   Assets  Assets:Communication Skills; Desire for Improvement; Financial Resources/Insurance; Housing; Intimacy; Leisure Time; Physical Health; Resilience; Social Support   Sleep  Sleep:Sleep: Fair   No data recorded  Physical Exam  Physical Exam Vitals and nursing note reviewed.  Constitutional:      Appearance: Normal appearance. He is well-developed.  HENT:     Head: Normocephalic and atraumatic.     Nose: Nose normal.  Cardiovascular:     Rate and Rhythm: Normal rate.  Pulmonary:     Effort: Pulmonary effort is normal.  Musculoskeletal:     Cervical back: Normal range of motion.  Neurological:     Mental Status: He is alert and oriented to person, place, and time.  Psychiatric:        Attention and Perception: Attention and perception normal.        Mood and Affect: Mood and affect normal.        Speech: Speech normal.        Behavior: Behavior normal. Behavior is cooperative.        Cognition and Memory: Cognition and memory normal.        Judgment: Judgment normal.    Review of Systems  Constitutional: Negative.   HENT: Negative.   Eyes: Negative.   Respiratory: Negative.   Cardiovascular: Negative.   Gastrointestinal: Negative.   Genitourinary: Negative.   Musculoskeletal: Negative.   Skin: Negative.   Neurological: Negative.   Endo/Heme/Allergies: Negative.   Psychiatric/Behavioral: Negative.    Blood pressure 100/73, pulse 84, temperature 97.7 F (36.5 C), temperature source Oral, resp. rate 16, SpO2 100 %. There is no height or weight on file to calculate BMI.  Demographic Factors:  Male  Loss Factors: NA  Historical Factors: NA  Risk Reduction Factors:   Living with another person, especially a relative, Positive social support, Positive therapeutic relationship and Positive coping skills or problem solving skills  Continued  Clinical Symptoms:  Previous Psychiatric Diagnoses and Treatments  Cognitive Features That Contribute To Risk:  None    Suicide Risk:  Minimal: No identifiable suicidal ideation.  Patients presenting with no risk factors but with morbid ruminations; may be classified as minimal risk based on the severity of  the depressive symptoms  Plan Of Care/Follow-up recommendations:  Other:  Patient reviewed with Dr. Bronwen BettersLaubach  [facility]. Sample medication provided. Medications: -Olanzapine 10 mg twice daily  Disposition: Discharge  Lenard Lanceina L Fleta Borgeson, FNP 10/27/2020, 2:10 PM

## 2020-10-27 NOTE — ED Notes (Signed)
Patient sleeping.  RR even and unlabored.  Continue to monitor for safety. 

## 2020-10-29 LAB — HEMOGLOBIN A1C
Hgb A1c MFr Bld: 4.9 % (ref 4.8–5.6)
Mean Plasma Glucose: 94 mg/dL

## 2020-11-01 ENCOUNTER — Ambulatory Visit (INDEPENDENT_AMBULATORY_CARE_PROVIDER_SITE_OTHER): Payer: Medicaid Other | Admitting: Family Medicine

## 2020-11-01 ENCOUNTER — Other Ambulatory Visit: Payer: Self-pay

## 2020-11-01 ENCOUNTER — Encounter: Payer: Self-pay | Admitting: Family Medicine

## 2020-11-01 VITALS — BP 106/60 | HR 101 | Ht 73.0 in | Wt 160.2 lb

## 2020-11-01 DIAGNOSIS — R7989 Other specified abnormal findings of blood chemistry: Secondary | ICD-10-CM | POA: Diagnosis present

## 2020-11-01 DIAGNOSIS — F200 Paranoid schizophrenia: Secondary | ICD-10-CM

## 2020-11-01 DIAGNOSIS — F321 Major depressive disorder, single episode, moderate: Secondary | ICD-10-CM | POA: Diagnosis not present

## 2020-11-01 NOTE — Patient Instructions (Signed)
I highly encourage talking with your counselor again or trying to find someone, especially someone you feel you can trust. Your kidney function that was previously high is now normal, which is great. If you have any issues or concerns you want to talk about or get checked out please let me know.   You can always call our office at [phone]

## 2020-11-01 NOTE — Progress Notes (Signed)
    SUBJECTIVE:   CHIEF COMPLAINT / HPI:   Patient's aunt was present for the entirety of the interview and examination per patient request.  Schizophrenia and MDD Aunt reports that she had to have him committed for 1 day last week.  From the records it seems like patient was more aggressive and did not believe that his aunt was part of his family and was becoming increasingly paranoid.  Patient states that counseling it is not helping and that he does not think the medications are helping.  His next appointment is with a psychiatrist for medication management in July.  He states that he does not want to talk to anybody about his problems right now.  Denies hallucinations, though and signaled that he has been having them.   Possible exposure to STI Aunt but a theoretical discussion (while hinting about it being about the patient) about if someone was exposed to chlamydia and her partner have been treated with a need to be treated.  Patient denies that he has any exposure and does not want any STI testing.   PERTINENT  PMH / PSH: Reviewed  OBJECTIVE:   BP 106/60   Pulse (!) 101   Ht 6\' 1"  (1.854 m)   Wt 160 lb 3.2 oz (72.7 kg)   SpO2 98%   BMI 21.14 kg/m   Gen: well-appearing, NAD CV: RRR, no m/r/g appreciated, no peripheral edema Pulm: CTAB, no wheezes/crackles GI: soft, non-tender, non-distended  Psych: very flat affect  ASSESSMENT/PLAN:   Schizophrenia paranoid type Patient recently committed by his Aunt due to increasingly aggressive behavior and worsening paranoia, which he appears to still have paranoia about his family in particular.  In the ED, patient was evaluated by behavioral health and home Haldol was discontinued and patient was placed on olanzapine 10 mg twice daily. -Continue olanzapine twice daily  MDD PHQ-9 was 15 with a positive #9 for suicidal ideation 6 months ago plan and states that it is more passive.  Patient is currently involved with psychiatry and  counseling, but he appears to not be following through with counseling very well. -Encouraged to continue counseling -Patient to follow-up with psychiatry for medications and follow-up in July  Possible STI exposure Of the patient denies exposure to chlamydia, and symptomatic events that he had been.  Patient declined testing and exposure. -Patient encouraged to seek out treatment or testing if he was exposed   August, DO [facility]

## 2020-12-11 ENCOUNTER — Other Ambulatory Visit (HOSPITAL_COMMUNITY): Payer: Self-pay | Admitting: Physician Assistant

## 2020-12-11 ENCOUNTER — Encounter (HOSPITAL_COMMUNITY): Payer: Self-pay | Admitting: Physician Assistant

## 2020-12-11 ENCOUNTER — Ambulatory Visit (INDEPENDENT_AMBULATORY_CARE_PROVIDER_SITE_OTHER): Payer: Medicaid Other | Admitting: Physician Assistant

## 2020-12-11 ENCOUNTER — Telehealth (HOSPITAL_COMMUNITY): Payer: Self-pay | Admitting: *Deleted

## 2020-12-11 ENCOUNTER — Other Ambulatory Visit: Payer: Self-pay

## 2020-12-11 VITALS — BP 116/69 | HR 67 | Ht 73.0 in | Wt 168.0 lb

## 2020-12-11 DIAGNOSIS — F200 Paranoid schizophrenia: Secondary | ICD-10-CM

## 2020-12-11 DIAGNOSIS — F411 Generalized anxiety disorder: Secondary | ICD-10-CM | POA: Diagnosis not present

## 2020-12-11 DIAGNOSIS — F329 Major depressive disorder, single episode, unspecified: Secondary | ICD-10-CM | POA: Diagnosis not present

## 2020-12-11 MED ORDER — OLANZAPINE 10 MG PO TABS: 10.0000 mg | ORAL_TABLET | Freq: Two times a day (BID) | ORAL | 1 refills | Status: DC

## 2020-12-11 NOTE — Progress Notes (Signed)
Provider was contacted by Direce E McIntyre, RMA regarding medication refill. Patient's medication to be e-prescribed to pharmacy of choice.

## 2020-12-11 NOTE — Telephone Encounter (Signed)
Rx REFILL REQUEST: OLANZapine (ZYPREXA) 10 MG tablet

## 2020-12-11 NOTE — Telephone Encounter (Signed)
Provider was contacted by Direce E McIntyre, RMA regarding medication refill. Patient's medication to be e-prescribed to pharmacy of choice.

## 2020-12-11 NOTE — Progress Notes (Signed)
BH MD/PA/NP OP Progress Note  12/11/2020 6:37 PM Jake Neal  MRN:  [id number]  Chief Complaint:  Chief Complaint   Injections    HPI:   Jake Neal is a 30 year old male with a past psychiatric history significant for paranoid schizophrenia, generalized anxiety disorder, and PTSD who presents to [facility] for follow-up and medication management.  Patient was originally taking the following medications:  Haldol 5 mg 2 times daily Benztropine 2 mg 2 times daily Olanzapine 5 mg at bedtime  Per chart review, [facility] on IVC for not taking his medications and reportedly threatening to kill himself.  The IVC paperwork was initiated by patient's aunt.  When patient asked why he was presenting to [facility], he reported that he accused his Aunt for the reason multiple family members are dead.  Patient also expressed that he did not know who his aunt was but confirmed that he was living with her.  Patient was discharged on 10/27/2020 and placed on olanzapine 10 mg 2 times daily.  Patient reports that he is doing all right.  He reports the following depressive symptoms: depressed mood, sleep disturbances, changes in appetite, and feelings of guilt/worthlessness.  When asked if he experienced lack of motivation, patient was unable to say.  Patient endorses anxiety he rates a 7 out of 10 and states that nothing is able to alleviate his anxiety.  When asked about the contributing factors to his anxiety, patient was unable to say.  He states, "I don't want to speak and I don't like telling people because, deep down, don't nobody want to be hearing about my problems."  Provider expressed to patient that he could share anything with them during the encounter.  Patient vocalizes understanding.  A PHQ-9 screen was performed with the patient scoring a 19.  A GAD-7 screen was also performed with the patient scoring a  15.  Patient is calm, cooperative, and fully engaged in conversation during the encounter.  Patient reports that his mood is 50-50/middle of the road.  Patient denies suicidal or homicidal ideations.  He further denies auditory or visual hallucinations and does not appear to be responding to internal/external stimuli.  Patient endorses that he still has issues with his sleep but states that he has been receiving on average 6 to 7 hours of sleep each night.  Patient endorses fair appetite and eats on average 1 meal a day composed of snacks.  Patient denies alcohol consumption and illicit drug use.  Patient endorses tobacco use and states that he smokes occasionally.  Visit Diagnosis:    ICD-10-CM   1. Schizophrenia, paranoid type (HCC)  F20.0       Past Psychiatric History:  Schizophrenia, paranoid type Generalized anxiety disorder PTSD  Past Medical History:  Past Medical History:  Diagnosis Date   Anxiety    Depression    Medical history non-contributory    Schizophrenia (HCC)    History reviewed. No pertinent surgical history.  Family Psychiatric History:  Unknown  Family History: History reviewed. No pertinent family history.  Social History:  Social History   Socioeconomic History   Marital status: Single    Spouse name: Not on file   Number of children: Not on file   Years of education: Not on file   Highest education level: Not on file  Occupational History   Not on file  Tobacco Use   Smoking status: Former  Smokeless tobacco: Never  Substance and Sexual Activity   Alcohol use: No   Drug use: Yes    Types: Marijuana    Comment: Currently denying substance use   Sexual activity: Yes    Birth control/protection: None  Other Topics Concern   Not on file  Social History Narrative   Not on file   Social Determinants of Health   Financial Resource Strain: Not on file  Food Insecurity: Not on file  Transportation Needs: Not on file  Physical Activity: Not  on file  Stress: Not on file  Social Connections: Not on file    Allergies: No Known Allergies  Metabolic Disorder Labs: Lab Results  Component Value Date   HGBA1C 4.9 10/26/2020   MPG 94 10/26/2020   MPG 96.8 10/22/2019   Lab Results  Component Value Date   PROLACTIN 9.9 07/06/2016   PROLACTIN 14.0 07/04/2016   Lab Results  Component Value Date   CHOL 234 (H) 10/26/2020   TRIG 52 10/26/2020   HDL 71 10/26/2020   CHOLHDL 3.3 10/26/2020   VLDL 10 10/26/2020   LDLCALC 153 (H) 10/26/2020   LDLCALC 137 (H) 10/22/2019   Lab Results  Component Value Date   TSH 3.799 10/26/2020   TSH 1.564 10/22/2019    Therapeutic Level Labs: No results found for: LITHIUM No results found for: VALPROATE No components found for:  CBMZ  Current Medications: Current Outpatient Medications  Medication Sig Dispense Refill   OLANZapine (ZYPREXA) 10 MG tablet Take 1 tablet (10 mg total) by mouth 2 (two) times daily. 60 tablet 1   No current facility-administered medications for this visit.     Musculoskeletal: Strength & Muscle Tone: within normal limits Gait & Station: normal Patient leans: N/A  Psychiatric Specialty Exam: Review of Systems  Psychiatric/Behavioral:  Positive for sleep disturbance. Negative for decreased concentration, dysphoric mood, hallucinations, self-injury and suicidal ideas. The patient is nervous/anxious. The patient is not hyperactive.    Blood pressure 116/69, pulse 67, height 6\' 1"  (1.854 m), weight 168 lb (76.2 kg), SpO2 100 %.Body mass index is 22.16 kg/m.  General Appearance: Fairly Groomed  Eye Contact:  Fair  Speech:  Clear and Coherent and Normal Rate  Volume:  Normal  Mood:  Anxious and Depressed  Affect:  Congruent and Depressed  Thought Process:  Coherent and Descriptions of Associations: Intact  Orientation:  Full (Time, Place, and Person)  Thought Content: WDL, Delusions, and Paranoid Ideation   Suicidal Thoughts:  No  Homicidal Thoughts:   No  Memory:  Immediate;   Fair Recent;   Fair Remote;   Fair  Judgement:  Impaired  Insight:  Lacking  Psychomotor Activity:  Normal  Concentration:  Concentration: Good and Attention Span: Good  Recall:  Fair  Fund of Knowledge: Poor  Language: Good  Akathisia:  NA  Handed:  Right  AIMS (if indicated): not done  Assets:  Communication Skills Desire for Improvement Housing Social Support  ADL's:  Impaired  Cognition: Impaired,  Mild  Sleep:  Fair   Screenings: AIMS    Flowsheet Row Admission (Discharged) from 02/28/2020 in [facility]PAT[facility] BE[facility]AL [facility]) [facility] Ad[facility] (Discharged) from OP [facility] Admission (Discharged) from OP Visit from 09/[facility]/2019 [facility] Admission (Discharged) from OP Visit from 02/04/2019 [facility]  AIMS Total Score 0 0 0 0 0  AUDIT    Flowsheet Row Admission (Discharged) from 02/28/2020 in [facility] INPATIENT BEHAV[facility]AL MEDICINE Admission (Discharged) [facility] Admission (Discharged) from OP Visit from [facility] Admission (Discharged) from OP Visit from 02/04/2019 [facility] Admission (Discharged) [facility]  Alcohol Use Disorder Identification Test Final Score (AUDIT) 1 0 0 2 0      GAD-7    Flowsheet Row Clinical Support from 12/11/2020 in [facility] Office Visit from 09/12/2020 in [facility] Office Visit from 08/01/2020 in [facility]  Total GAD-7 Score 15 4 21       PHQ2-9    Flowsheet Row Clinical Support from 12/11/2020 in [facility] Office Visit from  11/01/2020 in [facility] Office Visit from 10/24/2020 in [facility] Office Visit from 10/02/2020 in [facility] Office Visit from 09/12/2020 in [facility]  PHQ-2 Total Score 5 6 5 6 6   PHQ-9 Total Score 19 17 19 22 18       Flowsheet Row Clinical Support from 12/11/2020 in [facility] from 10/26/2020 in [facility] Office Visit from 10/24/2020 in [facility]  C-SSRS RISK CATEGORY Low Risk No Risk Low Risk        Assessment and Plan:   Jake Neal is a 30 year old male with a past psychiatric history significant for paranoid schizophrenia, generalized anxiety disorder, and PTSD who presents to [facility] for follow-up and medication management.  Patient reports anxiety and depressive episodes related to stressors in his life.  When asked to define the stressors in his life, patient refused to share the information.  Patient expresses that he has been taking his olanzapine as prescribed.  Patient was encouraged to talk about anything on his mind during future encounters.  Patient vocalized understanding.  Patient to continue taking medication as prescribed.  1. Schizophrenia, paranoid type (HCC) Patient to continue taking olanzapine 10 mg 2 times daily for the management of his schizophrenia  2. Generalized anxiety disorder Patient to continue taking olanzapine 10 mg 2 times daily for the management of his generalized anxiety  3. Major depressive disorder with single episode, remission status unspecified  Patient to follow up in 7 weeks Provider spent a total of 20 minutes with the patient/reviewing patient's chart  Joseph Art, PA 12/11/2020, 6:37 PM

## 2021-01-29 ENCOUNTER — Other Ambulatory Visit (HOSPITAL_COMMUNITY): Payer: Self-pay | Admitting: Physician Assistant

## 2021-01-29 DIAGNOSIS — F200 Paranoid schizophrenia: Secondary | ICD-10-CM

## 2021-01-31 ENCOUNTER — Telehealth (HOSPITAL_COMMUNITY): Payer: Self-pay | Admitting: *Deleted

## 2021-02-01 NOTE — Telephone Encounter (Signed)
Rx request taken care of.

## 2021-02-05 ENCOUNTER — Other Ambulatory Visit: Payer: Self-pay

## 2021-02-05 ENCOUNTER — Ambulatory Visit (INDEPENDENT_AMBULATORY_CARE_PROVIDER_SITE_OTHER): Payer: Medicaid Other | Admitting: Physician Assistant

## 2021-02-05 ENCOUNTER — Encounter (HOSPITAL_COMMUNITY): Payer: Self-pay | Admitting: Physician Assistant

## 2021-02-05 DIAGNOSIS — F200 Paranoid schizophrenia: Secondary | ICD-10-CM | POA: Diagnosis not present

## 2021-02-05 MED ORDER — OLANZAPINE 10 MG PO TABS: 10.0000 mg | ORAL_TABLET | Freq: Two times a day (BID) | ORAL | 1 refills | Status: DC

## 2021-02-08 ENCOUNTER — Encounter (HOSPITAL_COMMUNITY): Payer: Self-pay | Admitting: Physician Assistant

## 2021-02-08 NOTE — Progress Notes (Signed)
BH MD/PA/NP OP Progress Note  02/08/2021 8:00 PM Jake Neal  MRN:  [id number]  Chief Complaint:  Chief Complaint   Medication Management    HPI:   Jake Neal is a 30 year old male with a past psychiatric history significant for schizophrenia (paranoid type), generalized anxiety disorder, and PTSD who presents to [facility] via virtual telephone visit for follow-up and medication management.  Patient is currently taking the following medication: Olanzapine 10 mg 2 times daily.  Patient reports no issues or concerns regarding his current medication.  Patient endorses the following depressive symptoms: low mood, lack of motivation, decreased energy, and irritability.  Patient attributes his depressive episodes to life in general.  Patient denies any alleviating factors to his depressed mood.  Patient endorses anxiety which he rates  a 7 out of 10.  Patient states that life is the biggest stressor for him.  Patient expresses that he has been showering daily but he occasionally goes days without brushing his teeth.  Patient also expresses that he feels like his life is in danger and that he has felt this way for years.  Patient is unable to say what the danger is.  A PHQ-9 screen was performed with the patient scoring a 23.  A GAD-7 screen was also performed with the patient scoring a 19.  Patient is alert and oriented x4, calm, cooperative, and engaged in conversation during the encounter.  Patient endorses neutral mood.  Patient endorses fleeting thoughts of harming himself but states that he has no plan.  Patient denies homicidal ideations.  Patient further denies auditory or visual hallucinations and does not appear to be responding to internal/external stimuli.  Patient endorses fair sleep and receives on average 4 to 5 hours of sleep each night.  Patient endorses fair appetite and eats on average 1 meal and a snack per day.  Patient denies alcohol  consumption and illicit drug use.  Patient endorses occasional tobacco use in the form of smoking Black and Milds.  Visit Diagnosis:    ICD-10-CM   1. Schizophrenia, paranoid type (HCC)  F20.0 OLANZapine (ZYPREXA) 10 MG tablet      Past Psychiatric History:  Schizophrenia, paranoid type Generalized anxiety disorder PTSD  Past Medical History:  Past Medical History:  Diagnosis Date   Anxiety    Depression    Medical history non-contributory    Schizophrenia (HCC)    No past surgical history on file.  Family Psychiatric History:  Unknown  Family History: No family history on file.  Social History:  Social History   Socioeconomic History   Marital status: Single    Spouse name: Not on file   Number of children: Not on file   Years of education: Not on file   Highest education level: Not on file  Occupational History   Not on file  Tobacco Use   Smoking status: Former   Smokeless tobacco: Never  Substance and Sexual Activity   Alcohol use: No   Drug use: Yes    Types: Marijuana    Comment: Currently denying substance use   Sexual activity: Yes    Birth control/protection: None  Other Topics Concern   Not on file  Social History Narrative   Not on file   Social Determinants of Health   Financial Resource Strain: Not on file  Food Insecurity: Not on file  Transportation Needs: Not on file  Physical Activity: Not on file  Stress: Not on file  Social Connections: Not on file    Allergies: No Known Allergies  Metabolic Disorder Labs: Lab Results  Component Value Date   HGBA1C 4.9 10/26/2020   MPG 94 10/26/2020   MPG 96.8 10/22/2019   Lab Results  Component Value Date   PROLACTIN 9.9 07/06/2016   PROLACTIN 14.0 07/04/2016   Lab Results  Component Value Date   CHOL 234 (H) 10/26/2020   TRIG 52 10/26/2020   HDL 71 10/26/2020   CHOLHDL 3.3 10/26/2020   VLDL 10 10/26/2020   LDLCALC 153 (H) 10/26/2020   LDLCALC 137 (H) 10/22/2019   Lab Results   Component Value Date   TSH 3.799 10/26/2020   TSH 1.564 10/22/2019    Therapeutic Level Labs: No results found for: LITHIUM No results found for: VALPROATE No components found for:  CBMZ  Current Medications: Current Outpatient Medications  Medication Sig Dispense Refill   OLANZapine (ZYPREXA) 10 MG tablet Take 1 tablet (10 mg total) by mouth 2 (two) times daily. 60 tablet 1   No current facility-administered medications for this visit.     Musculoskeletal: Strength & Muscle Tone: within normal limits Gait & Station: normal Patient leans: N/A  Psychiatric Specialty Exam: Review of Systems  Psychiatric/Behavioral:  Positive for sleep disturbance and suicidal ideas. Negative for decreased concentration, dysphoric mood, hallucinations and self-injury. The patient is nervous/anxious. The patient is not hyperactive.    Blood pressure 120/70, pulse 69, height 6\' 1"  (1.854 m), weight 177 lb (80.3 kg).Body mass index is 23.35 kg/m.  General Appearance: Fairly Groomed  Eye Contact:  Good  Speech:  Clear and Coherent and Normal Rate  Volume:  Normal  Mood:  Anxious and Depressed  Affect:  Congruent and Depressed  Thought Process:  Coherent and Descriptions of Associations: Intact  Orientation:  Full (Time, Place, and Person)  Thought Content: WDL   Suicidal Thoughts:  Yes.  without intent/plan  Homicidal Thoughts:  No  Memory:  Immediate;   Fair Recent;   Fair Remote;   Fair  Judgement:  Impaired  Insight:  Lacking  Psychomotor Activity:  Normal  Concentration:  Concentration: Good and Attention Span: Good  Recall:  Fair  Fund of Knowledge: Poor  Language: Good  Akathisia:  NA  Handed:  Right  AIMS (if indicated): not done  Assets:  Communication Skills Desire for Improvement Housing Social Support  ADL's:  Impaired  Cognition: Impaired,  Mild  Sleep:  Fair   Screenings: AIMS    Flowsheet Row Admission (Discharged) from 02/28/2020 in [facility] INPATIENT BEHAVIORAL  MEDICINE Admission [facility]) [facility] Admission (Discharged) from OP Visit from [facility] Admission (Discharged) from OP Visit from 09/17/2019 [facility] Admission (Discharged) from OP Visit from 02/04/2019 [facility]  AIMS Total Score 0 0 0 0 0      AUDIT    Flowsheet Row Admission (Discharged) from 02/28/2020 in [facility] INPATIENT BEHAVIORAL MEDICINE Admission (Discharged) from [facility] Admission (Discharged) from OP Visit from [facility] Admission (Discharged) from OP Visit from 02/04/2019 in [facility] Admission (Discharged) from [facility]/2018 [facility]  Alcohol Use Disorder Identification Test Final Score (AUDIT) 1 0 0 2 0      GAD-7    Flowsheet Row Clinical Support from 02/05/2021 in [facility]  Visit from 12/11/2020 in [facility] Office Visit from 09/12/2020 in [facility] Office Visit from 08/01/2020 in [facility]  Total GAD-7 Score 19 15 4 21       PHQ2-9    Flowsheet Row Clinical Support from 02/05/2021 in [facility] Office Visit from 12/11/2020 in [facility] Office Visit from 11/01/2020 in [facility] Office Visit from 10/24/2020 in [facility] Office Visit from 10/02/2020 in [facility]  PHQ-2 Total Score 5 5 6 5 6   PHQ-9 Total Score 23 19 17 19 22       Flowsheet Row Clinical Support from 02/05/2021 in [facility] Office Visit from 12/11/2020 in [facility] ED from 10/26/2020 in  [facility]  C-SSRS RISK CATEGORY Low Risk Low Risk No Risk        Assessment and Plan:   Jake Neal is a 30 year old male with a past psychiatric history significant for schizophrenia (paranoid type), generalized anxiety disorder, and PTSD who presents to [facility] via virtual telephone visit for follow-up and medication management.  States that he has been taking his olanzapine as prescribed but continues to experience depressive episodes, anxiety, and the feeling that his life is in danger.  Patient denies wanting to add on any other medications for the management of his depressive episodes or anxiety.  Patient is comfortable with continuing to take olanzapine 10 mg 2 times daily.  Patient's medication to be e-prescribed to pharmacy of choice.  1. Schizophrenia, paranoid type (HCC)  - OLANZapine (ZYPREXA) 10 MG tablet; Take 1 tablet (10 mg total) by mouth 2 (two) times daily.  Dispense: 60 tablet; Refill: 1  Patient to follow up in 2 months Provider spent a total of 17 minutes with the patient/reviewing the patient's chart  Joseph Art, PA 02/08/2021, 8:00 PM

## 2021-04-11 ENCOUNTER — Ambulatory Visit (INDEPENDENT_AMBULATORY_CARE_PROVIDER_SITE_OTHER): Payer: Medicaid Other | Admitting: Physician Assistant

## 2021-04-11 ENCOUNTER — Other Ambulatory Visit: Payer: Self-pay

## 2021-04-11 ENCOUNTER — Encounter (HOSPITAL_COMMUNITY): Payer: Self-pay | Admitting: Physician Assistant

## 2021-04-11 VITALS — BP 126/66 | HR 68 | Ht 73.0 in | Wt 187.0 lb

## 2021-04-11 DIAGNOSIS — F200 Paranoid schizophrenia: Secondary | ICD-10-CM

## 2021-04-11 DIAGNOSIS — F515 Nightmare disorder: Secondary | ICD-10-CM | POA: Insufficient documentation

## 2021-04-11 MED ORDER — PRAZOSIN HCL 1 MG PO CAPS: 1.0000 mg | ORAL_CAPSULE | Freq: Every day | ORAL | 0 refills | Status: DC

## 2021-04-11 MED ORDER — OLANZAPINE 10 MG PO TABS: 10.0000 mg | ORAL_TABLET | Freq: Two times a day (BID) | ORAL | 1 refills | Status: DC

## 2021-04-11 NOTE — Progress Notes (Addendum)
BH MD/PA/NP OP Progress Note  04/11/2021 3:37 PM Jake Neal  MRN:  [id number]  Chief Complaint:  Chief Complaint   Medication Management     HPI:   Jake Neal is a 30 year old male with a past psychiatric history significant for schizophrenia, generalized anxiety disorder, and PTSD who presents to [facility] behavioral health outpatient clinic for follow-up and medication management.  Patient is currently being managed on the following medication: Olanzapine 10 mg 2 times daily.  Patient reports no issues or concerns regarding his current medication.  Patient denies a need for dosage adjustments at this time.  Patient presents today reporting bad dreams.  In his dreams, patient states that he was about to be killed and actually felt like he was going to die.  He states that these dreams are very vivid and but then his dreams, his family is out to get him. Patient has had dreams like this before which he has describes as nightmares but states that they have been happening a lot more lately.  Patient also endorses depression that he attributes to his current strength of bad dreams.  Patient endorses lack of motivation, decreased energy, decreased concentration, feelings of guilt/worthlessness, and irritability.  Patient denies crying spells.  Patient also endorses anxiety that he also attributes to his drains.  Patient rates his anxiety an 8 out of 10.  Patient denies alleviating factors to his anxiety.  A PHQ-9 screen was performed with the patient scoring an 18.  A GAD-7 screen was also performed with the patient scoring a 20.  Patient is alert and oriented x4, calm, cooperative, and fully engaged in conversation during the encounter.  Patient endorses sad mood.  Patient denies suicidal or homicidal ideations.  Patient denies auditory or visual hallucinations and does not appear to be responding to internal/external stimuli.  Patient endorses fair sleep and receives on average 4 to 7  hours of intermittent sleep.  Patient endorses decreased appetite and eats on average 1 meal per day.  Patient denies alcohol consumption and illicit drug use.  Patient endorses tobacco use occasionally.  Visit Diagnosis:    ICD-10-CM   1. Nightmares  F51.5 prazosin (MINIPRESS) 1 MG capsule    2. Schizophrenia, paranoid type (HCC)  F20.0 OLANZapine (ZYPREXA) 10 MG tablet      Past Psychiatric History:  Schizophrenia, paranoid type Generalized anxiety disorder PTSD  Past Medical History:  Past Medical History:  Diagnosis Date   Anxiety    Depression    Medical history non-contributory    Schizophrenia (HCC)    No past surgical history on file.  Family Psychiatric History:  Unknown  Family History: No family history on file.  Social History:  Social History   Socioeconomic History   Marital status: Single    Spouse name: Not on file   Number of children: Not on file   Years of education: Not on file   Highest education level: Not on file  Occupational History   Not on file  Tobacco Use   Smoking status: Former   Smokeless tobacco: Never  Substance and Sexual Activity   Alcohol use: No   Drug use: Yes    Types: Marijuana    Comment: Currently denying substance use   Sexual activity: Yes    Birth control/protection: None  Other Topics Concern   Not on file  Social History Narrative   Not on file   Social Determinants of Health   Financial Resource Strain: Not on file  Food Insecurity: Not on file  Transportation Needs: Not on file  Physical Activity: Not on file  Stress: Not on file  Social Connections: Not on file    Allergies: No Known Allergies  Metabolic Disorder Labs: Lab Results  Component Value Date   HGBA1C 4.9 10/26/2020   MPG 94 10/26/2020   MPG 96.8 10/22/2019   Lab Results  Component Value Date   PROLACTIN 9.9 07/06/2016   PROLACTIN 14.0 07/04/2016   Lab Results  Component Value Date   CHOL 234 (H) 10/26/2020   TRIG 52  10/26/2020   HDL 71 10/26/2020   CHOLHDL 3.3 10/26/2020   VLDL 10 10/26/2020   LDLCALC 153 (H) 10/26/2020   LDLCALC 137 (H) 10/22/2019   Lab Results  Component Value Date   TSH 3.799 10/26/2020   TSH 1.564 10/22/2019    Therapeutic Level Labs: No results found for: LITHIUM No results found for: VALPROATE No components found for:  CBMZ  Current Medications: Current Outpatient Medications  Medication Sig Dispense Refill   prazosin (MINIPRESS) 1 MG capsule Take 1 capsule (1 mg total) by mouth at bedtime. 30 capsule 0   OLANZapine (ZYPREXA) 10 MG tablet Take 1 tablet (10 mg total) by mouth 2 (two) times daily. 60 tablet 1   No current facility-administered medications for this visit.     Musculoskeletal: Strength & Muscle Tone: within normal limits Gait & Station: normal Patient leans: N/A  Psychiatric Specialty Exam: Review of Systems  Psychiatric/Behavioral:  Positive for decreased concentration and sleep disturbance. Negative for dysphoric mood, hallucinations, self-injury and suicidal ideas. The patient is nervous/anxious. The patient is not hyperactive.    Blood pressure 126/66, pulse 68, height 6\' 1"  (1.854 m), weight 187 lb (84.8 kg).Body mass index is 24.67 kg/m.  General Appearance: Well Groomed  Eye Contact:  Good  Speech:  Clear and Coherent and Normal Rate  Volume:  Normal  Mood:  Anxious and Depressed  Affect:  Congruent and Depressed  Thought Process:  Coherent, Goal Directed, and Descriptions of Associations: Intact  Orientation:  Full (Time, Place, and Person)  Thought Content: WDL   Suicidal Thoughts:  No  Homicidal Thoughts:  No  Memory:  Immediate;   Fair Recent;   Fair Remote;   Fair  Judgement:  Impaired  Insight:  Lacking  Psychomotor Activity:  Normal  Concentration:  Concentration: Good and Attention Span: Good  Recall:  Fair  Fund of Knowledge: Poor  Language: Good  Akathisia:  NA  Handed:  Right  AIMS (if indicated): not done   Assets:  Communication Skills Desire for Improvement Housing Social Support  ADL's:  Impaired  Cognition: Impaired,  Mild  Sleep:  Fair   Screenings: AIMS    Flowsheet Row Admission (Discharged) from 02/28/2020 in [facility]) [facility] Ad[facility] (Discharged) from OP [facility] Admission (Discharged) from OP Visit from 09[facility]2019 [facility] Admission (Discharged) from OP Visit from 02/04/2019 [facility]  AIMS Total Score 0 0 0 0 0      AUDIT    Flowsheet Row Admission (Discharged) from 02/28/2020 in [facility]PATIENT BEHAV[facility]ICINE Admission (Discharged) [facility] Admission (Discharged) from OP Visit from [facility] Admission (Discharged) from OP Visit from 02/04/2019 [facility] Admission (Discharged) from [facility]2018 [facility]  Alcohol Use Disorder Identification Test Final Score (AUDIT) 1 0 0 2 0      GAD-7    Flowsheet Row Office Visit from 04/11/2021 in [facility] Office Visit from 02/05/2021 in [facility] Office Visit from 12/11/2020 in [facility] Office Visit from 09/12/2020 in [facility] Office Visit from 08/01/2020 in [facility]  Total GAD-7 Score 20 19 15 4 21       PHQ2-9    Flowsheet Row Office Visit from 04/11/2021 in [facility] Office Visit from 02/05/2021 in [facility] Office Visit from 12/11/2020 in [facility] Office Visit from 11/01/2020 in [facility]  Office Visit from 10/24/2020 in [facility]  PHQ-2 Total Score 5 5 5 6 5   PHQ-9 Total Score 18 23 19 17 19       Flowsheet Row Office Visit from 04/11/2021 in [facility] Office Visit from 02/05/2021 in [facility] Office Visit from 12/11/2020 in [facility]  C-SSRS RISK CATEGORY Low Risk Low Risk Low Risk        Assessment and Plan:   Jake Neal is a 29 year old male with a past psychiatric history significant for schizophrenia, generalized anxiety disorder, and PTSD who presents to [facility] behavioral health outpatient clinic for follow-up and medication management.  Patient reports that he has been having a string of visited dreams where his life is in danger.  Due to these dreams, patient has experienced worsening anxiety and depression.  Patient was recommended prazosin 1 mg at bedtime for the management of his nightmares.  Patient was agreeable to recommendation.  Patient's medications to be e-prescribed to pharmacy of choice.  1. Schizophrenia, paranoid type (HCC)  - OLANZapine (ZYPREXA) 10 MG tablet; Take 1 tablet (10 mg total) by mouth 2 (two) times daily.  Dispense: 60 tablet; Refill: 1  2. Nightmares  - prazosin (MINIPRESS) 1 MG capsule; Take 1 capsule (1 mg total) by mouth at bedtime.  Dispense: 30 capsule; Refill: 0  Patient to follow up in 2 months Provider spent a total of 16 minutes with the patient/reviewing the patient's chart  Joseph Art, PA 04/11/2021, 3:37 PM

## 2021-06-13 ENCOUNTER — Encounter (HOSPITAL_COMMUNITY): Payer: Self-pay | Admitting: Physician Assistant

## 2021-06-13 ENCOUNTER — Other Ambulatory Visit: Payer: Self-pay

## 2021-06-13 ENCOUNTER — Ambulatory Visit (INDEPENDENT_AMBULATORY_CARE_PROVIDER_SITE_OTHER): Payer: Medicaid Other | Admitting: Physician Assistant

## 2021-06-13 DIAGNOSIS — F515 Nightmare disorder: Secondary | ICD-10-CM | POA: Diagnosis not present

## 2021-06-13 DIAGNOSIS — F200 Paranoid schizophrenia: Secondary | ICD-10-CM | POA: Diagnosis not present

## 2021-06-13 MED ORDER — OLANZAPINE 10 MG PO TABS: 10.0000 mg | ORAL_TABLET | Freq: Two times a day (BID) | ORAL | 1 refills | Status: DC

## 2021-06-13 MED ORDER — PRAZOSIN HCL 1 MG PO CAPS: 1.0000 mg | ORAL_CAPSULE | Freq: Every day | ORAL | 1 refills | Status: DC

## 2021-06-13 MED ORDER — PRAZOSIN HCL 1 MG PO CAPS: 1.0000 mg | ORAL_CAPSULE | Freq: Every day | ORAL | 0 refills | Status: DC

## 2021-06-13 NOTE — Progress Notes (Signed)
BH MD/PA/NP OP Progress Note  06/13/2021 4:37 PM Jake Neal  MRN:  [id number]  Chief Complaint:  Chief Complaint   Medication Management    HPI:   Jake Neal is a 31 year old male with a past psychiatric history significant for schizophrenia, generalized anxiety disorder, and PTSD who presents to [facility] for follow-up and medication management.  Patient is currently being managed on the following medications:  Olanzapine 10 mg 2 times daily Prazosin 1 mg at bedtime  Patient reports no issues or concerns regarding his current medication regimen.  Patient denies the need for dosage adjustments at this time and is requesting refills on all his medications following the conclusion of the encounter.  Patient denies any adverse side effects from his medications.  He reports that he still continues to have dreams.  He states that he experiences nightmares less frequently but still continues to dream.  He states that when he does dream, he is unable to remember most of them.  When asked if he experienced depression, patient replied "50/50."  He states that most days he feels neutral but when he does feel depressed, he keeps to himself.  Patient endorses anxiety and rates his anxiety at 7 out of 10.  Patient states that he is anxious about dying.  He feels that he is about to die but does not know why.  He states that he does not trust his family members and believes that they may be the ones to kill him.  Patient is still currently living with them but does not feel safe.  A PHQ-9 screen was performed with the patient scoring a 23.  A GAD-7 screen was performed with the patient scoring an 18.  Patient is alert and oriented x4, calm, cooperative, and fully engaged in conversation during the encounter.  Patient endorses neutral mood.  Patient denies suicidal or homicidal ideations.  He further denies auditory or visual hallucinations and does not appear  to be responding to internal/external stimuli.  Patient states that a week ago he did hear a girl's voice when no one was home.  Patient endorses receiving roughly more than 5 hours of sleep each night.  Patient endorses decreased appetite but eats at least 1 meal and some snacks.  Patient denies alcohol consumption and illicit drug use.  Patient denies recent tobacco use.  Visit Diagnosis:    ICD-10-CM   1. Schizophrenia, paranoid type (HCC)  F20.0 OLANZapine (ZYPREXA) 10 MG tablet    2. Nightmares  F51.5 prazosin (MINIPRESS) 1 MG capsule      Past Psychiatric History:  Schizophrenia, paranoid type Generalized anxiety disorder PTSD  Past Medical History:  Past Medical History:  Diagnosis Date   Anxiety    Depression    Medical history non-contributory    Schizophrenia (HCC)    No past surgical history on file.  Family Psychiatric History:  Unknown  Family History: No family history on file.  Social History:  Social History   Socioeconomic History   Marital status: Single    Spouse name: Not on file   Number of children: Not on file   Years of education: Not on file   Highest education level: Not on file  Occupational History   Not on file  Tobacco Use   Smoking status: Former   Smokeless tobacco: Never  Substance and Sexual Activity   Alcohol use: No   Drug use: Yes    Types: Marijuana    Comment:  Currently denying substance use   Sexual activity: Yes    Birth control/protection: None  Other Topics Concern   Not on file  Social History Narrative   Not on file   Social Determinants of Health   Financial Resource Strain: Not on file  Food Insecurity: Not on file  Transportation Needs: Not on file  Physical Activity: Not on file  Stress: Not on file  Social Connections: Not on file    Allergies: No Known Allergies  Metabolic Disorder Labs: Lab Results  Component Value Date   HGBA1C 4.9 10/26/2020   MPG 94 10/26/2020   MPG 96.8 10/22/2019   Lab  Results  Component Value Date   PROLACTIN 9.9 07/06/2016   PROLACTIN 14.0 07/04/2016   Lab Results  Component Value Date   CHOL 234 (H) 10/26/2020   TRIG 52 10/26/2020   HDL 71 10/26/2020   CHOLHDL 3.3 10/26/2020   VLDL 10 10/26/2020   LDLCALC 153 (H) 10/26/2020   LDLCALC 137 (H) 10/22/2019   Lab Results  Component Value Date   TSH 3.799 10/26/2020   TSH 1.564 10/22/2019    Therapeutic Level Labs: No results found for: LITHIUM No results found for: VALPROATE No components found for:  CBMZ  Current Medications: Current Outpatient Medications  Medication Sig Dispense Refill   OLANZapine (ZYPREXA) 10 MG tablet Take 1 tablet (10 mg total) by mouth 2 (two) times daily. 60 tablet 1   prazosin (MINIPRESS) 1 MG capsule Take 1 capsule (1 mg total) by mouth at bedtime. 30 capsule 0   No current facility-administered medications for this visit.     Musculoskeletal: Strength & Muscle Tone: within normal limits Gait & Station: normal Patient leans: N/A  Psychiatric Specialty Exam: Review of Systems  Psychiatric/Behavioral:  Positive for sleep disturbance. Negative for decreased concentration, dysphoric mood, hallucinations, self-injury and suicidal ideas. The patient is nervous/anxious. The patient is not hyperactive.    Blood pressure 112/67, pulse 71, height 6\' 2"  (1.88 m), weight 199 lb (90.3 kg).Body mass index is 25.55 kg/m.  General Appearance: Casual  Eye Contact:  Fair  Speech:  Clear and Coherent and Normal Rate  Volume:  Normal  Mood:  Depressed  Affect:  Blunt and Congruent  Thought Process:  Coherent and Descriptions of Associations: Intact  Orientation:  Full (Time, Place, and Person)  Thought Content: WDL   Suicidal Thoughts:  No  Homicidal Thoughts:  No  Memory:  Immediate;   Fair Recent;   Fair Remote;   Fair  Judgement:  Impaired  Insight:  Lacking  Psychomotor Activity:  Psychomotor Retardation  Concentration:  Concentration: Good and Attention  Span: Good  Recall:  Fair  Fund of Knowledge: Poor  Language: Good  Akathisia:  NA  Handed:  Right  AIMS (if indicated): not done  Assets:  Communication Skills Desire for Improvement Housing Social Support  ADL's:  Impaired  Cognition: Impaired,  Mild  Sleep:  Fair   Screenings: AIMS    Flowsheet Row Admission (Discharged) from 02/28/2020 in [facility] INPATIENT BEHAV[facility]AL MEDICINE Ad[facility]) [facility] Admission (Discharged) from OP Visit from [facility] Admission (Discharged) from OP Visit from 09/17/2019 in [facility] Admission (Discharged) from OP Visit from 02/04/2019 in [facility]  AIMS Total Score 0 0 0 0 0      AUDIT    Flowsheet Row Admission (Discharged) from 02/28/2020 in [facility] INPATIENT BEHAVIORAL  MEDICINE Admission (Discharged) [facility] Admission (Discharged) from OP Visit from 10/[facility] Admission (Discharged) from OP Visit from 02/04/2019 in [facility] Admission (Discharged) from [facility]2018 [facility]  Alcohol Use Disorder Identification Test Final Score (AUDIT) 1 0 0 2 0      GAD-7    Flowsheet Row [facility]  Total GAD-7 Score 18 20 19 15 4       PHQ2-9    Flowsheet Row Clinical [facility]  PHQ-2 Total Score 5 5 5 5 6   PHQ-9 Total Score 23 18 23 19 17       Flowsheet Row [facility]  C-SSRS RISK CATEGORY Low Risk Low Risk Low Risk        Assessment and Plan:   Mal A. Joseph ArtWoods is a 31 year old male with a past psychiatric history significant for schizophrenia, generalized anxiety disorder, and PTSD [facility] and medication management.  Patient states that he continues to have dreams but his nightmares have lessened some.  Patient states that he feels neutral most days but does endorse some depression and anxiety at times.  Patient exhibits paranoia stating that he does not trust his family and believes that they will kill him.  Patient denies wanting to adjust his medications.  Patient to continue taking his medications as prescribed.  Patient's medications to be e-prescribed to pharmacy of choice.  1. Schizophrenia, paranoid type (HCC)  - OLANZapine (ZYPREXA) 10 MG tablet; Take 1 tablet (10 mg total) by mouth 2 (two) times daily.  Dispense: 60 tablet; Refill: 1  2. Nightmares  - prazosin (MINIPRESS) 1 MG capsule; Take 1 capsule (1 mg total) by mouth at bedtime.  Dispense: 30 capsule; Refill: 1  Patient to follow up in 2 months Provider spent a total of 14 minutes with the patient/reviewing patient's chart  Meta HatchetUchenna E Nazly Digilio, PA 06/13/2021, 4:37 PM

## 2021-07-25 ENCOUNTER — Ambulatory Visit (HOSPITAL_COMMUNITY)
Admission: EM | Admit: 2021-07-25 | Discharge: 2021-07-25 | Disposition: A | Discharge status: Home / Self Care | Payer: Medicaid Other | Attending: Nurse Practitioner | Admitting: Nurse Practitioner

## 2021-07-25 ENCOUNTER — Encounter (HOSPITAL_COMMUNITY): Payer: Self-pay

## 2021-07-25 ENCOUNTER — Other Ambulatory Visit: Payer: Self-pay

## 2021-07-25 DIAGNOSIS — K0889 Other specified disorders of teeth and supporting structures: Secondary | ICD-10-CM | POA: Diagnosis not present

## 2021-07-25 DIAGNOSIS — K047 Periapical abscess without sinus: Secondary | ICD-10-CM

## 2021-07-25 MED ORDER — AMOXICILLIN-POT CLAVULANATE 875-125 MG PO TABS: 1.0000 | ORAL_TABLET | Freq: Two times a day (BID) | ORAL | 0 refills | Status: AC

## 2021-07-25 NOTE — Discharge Instructions (Addendum)
Please take Augmentin twice daily for 7 days for the dental infection.  Please also reach out to a dentist on the paper provided and get an appointment for next week to have evaluation of your teeth. ?

## 2021-07-25 NOTE — ED Triage Notes (Signed)
Pt presents with c/o l side dental pain.  ? ?States his cheeks are swelling.  ?

## 2021-07-25 NOTE — ED Provider Notes (Signed)
?[place] ? ? ? ?CSN: [id number] ?Arrival date & time: 07/25/21  1205 ? ? ?  ? ?History   ?Chief Complaint ?Chief Complaint  ?Patient presents with  ? Dental Pain  ? ? ?HPI ?Jake Neal is a 31 y.o. male.  ? ?Patient reports pain in his left upper teeth that has started over the past couple of weeks.  He reports his face has been swelling the past couple days, and he felt drainage and he said drainage last night.  He is unable to chew food on the left side of his face because of the pain.  He denies fevers, body aches, chills, nausea or vomiting.  He reports he is eating well, just eating on the other side as well.  He reports this is never happened in the past.  He does not have a dentist. ? ? ?Past Medical History:  ?Diagnosis Date  ? Anxiety   ? Depression   ? Medical history non-contributory   ? Schizophrenia ([place])   ? ? ?Patient Active Problem List  ? Diagnosis Date Noted  ? Nightmares 04/11/2021  ? Major depressive disorder with single episode 07/03/2020  ? Generalized anxiety disorder 06/27/2020  ? Schizophrenia ([place]) 02/28/2020  ? Schizophrenia, paranoid type ([place]) 02/04/2019  ? Chronic post-traumatic stress disorder (PTSD) 01/28/2019  ? ? ?History reviewed. No pertinent surgical history. ? ? ? ? ?Home Medications   ? ?Prior to Admission medications   ?Medication Sig Start Date End Date Taking? Authorizing Provider  ?amoxicillin-clavulanate (AUGMENTIN) 875-125 MG tablet Take 1 tablet by mouth 2 (two) times daily for 7 days. 07/25/21 08/01/21 Yes Eulogio Bear, NP  ?OLANZapine (ZYPREXA) 10 MG tablet Take 1 tablet (10 mg total) by mouth 2 (two) times daily. 06/13/21   N[place], Terese Door, PA  ?prazosin (MINIPRESS) 1 MG capsule Take 1 capsule (1 mg total) by mouth at bedtime. 06/13/21   Malachy Mood, PA  ? ? ?Family History ?History reviewed. No pertinent family history. ? ?Social History ?Social History  ? ?Tobacco Use  ? Smoking status: Former  ? Smokeless tobacco: Never  ?Substance Use Topics   ? Alcohol use: No  ? Drug use: Yes  ?  Types: Marijuana  ?  Comment: Currently denying substance use  ? ? ? ?Allergies   ?Patient has no known allergies. ? ? ?Review of Systems ?Review of Systems ?Per HPI ? ?Physical Exam ?Triage Vital Signs ?ED Triage Vitals  ?Enc Vitals Group  ?   BP 07/25/21 1357 124/75  ?   Pulse Rate 07/25/21 1357 70  ?   Resp 07/25/21 1357 16  ?   Temp 07/25/21 1357 97.9 ?F (36.6 ?C)  ?   Temp Source 07/25/21 1357 Oral  ?   SpO2 07/25/21 1357 98 %  ?   Weight --   ?   Height --   ?   Head Circumference --   ?   Peak Flow --   ?   Pain Score 07/25/21 1356 10  ?   Pain Loc --   ?   Pain Edu? --   ?   Excl. in [place]? --   ? ?No data found. ? ?Updated Vital Signs ?BP 124/75 (BP Location: Left Arm)   Pulse 70   Temp 97.9 ?F (36.6 ?C) (Oral)   Resp 16   SpO2 98%  ? ?Visual Acuity ?Right Eye Distance:   ?Left Eye Distance:   ?Bilateral Distance:   ? ?Right Eye Near:   ?  Left Eye Near:    ?Bilateral Near:    ? ?Physical Exam ?Vitals and nursing note reviewed.  ?Constitutional:   ?   General: He is not in acute distress. ?   Appearance: Normal appearance. He is not toxic-appearing.  ?HENT:  ?   Nose: Nose normal. No congestion.  ?   Mouth/Throat:  ?   Mouth: Mucous membranes are moist.  ?   Dentition: Dental caries and dental abscesses present.  ?   Pharynx: Oropharynx is clear.  ? ?   Comments: Dental abscess appreciated to left upper teeth in area marked ?Cardiovascular:  ?   Rate and Rhythm: Normal rate and regular rhythm.  ?Pulmonary:  ?   Effort: Pulmonary effort is normal. No respiratory distress.  ?   Breath sounds: Normal breath sounds. No wheezing, rhonchi or rales.  ?Musculoskeletal:  ?   Cervical back: Normal range of motion.  ?Lymphadenopathy:  ?   Cervical: No cervical adenopathy.  ?Skin: ?   General: Skin is warm and dry.  ?Neurological:  ?   Mental Status: He is alert and oriented to person, place, and time.  ?Psychiatric:     ?   Mood and Affect: Mood normal.     ?   Behavior: Behavior  normal.     ?   Thought Content: Thought content normal.     ?   Judgment: Judgment normal.  ? ? ? ?UC Treatments / Results  ?Labs ?(all labs ordered are listed, but only abnormal results are displayed) ?Labs Reviewed - No data to display ? ?EKG ? ? ?Radiology ?No results found. ? ?Procedures ?Procedures (including critical care time) ? ?Medications Ordered in UC ?Medications - No data to display ? ?Initial Impression / Assessment and Plan / UC Course  ?I have reviewed the triage vital signs and the nursing notes. ? ?Pertinent labs & imaging results that were available during my care of the patient were reviewed by me and considered in my medical decision making (see chart for details). ? ?  ?Will treat dental abscess with Augmentin twice daily for 7 days.  Encouraged completing the entire course of medication and follow up with Dentist next week once infection has cleared up.  List of dentists provided.  Discussed signs symptoms of systemic infection and I encouraged the patient to seek emergent care if these develop. ? ?Final Clinical Impressions(s) / UC Diagnoses  ? ?Final diagnoses:  ?Pain, dental  ?Dental abscess  ? ? ? ?Discharge Instructions   ? ?  ?Please take Augmentin twice daily for 7 days for the dental infection.  Please also reach out to a dentist on the paper provided and get an appointment for next week to have evaluation of your teeth. ? ? ? ? ?ED Prescriptions   ? ? Medication Sig Dispense Auth. Provider  ? amoxicillin-clavulanate (AUGMENTIN) 875-125 MG tablet Take 1 tablet by mouth 2 (two) times daily for 7 days. 14 tablet Eulogio Bear, NP  ? ?  ? ?PDMP not reviewed this encounter. ?  ?Eulogio Bear, NP ?07/25/21 1422 ? ?

## 2021-08-08 ENCOUNTER — Encounter (HOSPITAL_COMMUNITY): Payer: Self-pay | Admitting: Physician Assistant

## 2021-08-08 ENCOUNTER — Ambulatory Visit (INDEPENDENT_AMBULATORY_CARE_PROVIDER_SITE_OTHER): Payer: Medicaid Other | Admitting: Physician Assistant

## 2021-08-08 ENCOUNTER — Other Ambulatory Visit: Payer: Self-pay

## 2021-08-08 DIAGNOSIS — F515 Nightmare disorder: Secondary | ICD-10-CM

## 2021-08-08 DIAGNOSIS — F200 Paranoid schizophrenia: Secondary | ICD-10-CM | POA: Diagnosis not present

## 2021-08-08 MED ORDER — OLANZAPINE 10 MG PO TABS: 10.0000 mg | ORAL_TABLET | Freq: Two times a day (BID) | ORAL | 1 refills | Status: DC

## 2021-08-08 MED ORDER — PRAZOSIN HCL 1 MG PO CAPS: 1.0000 mg | ORAL_CAPSULE | Freq: Every day | ORAL | 1 refills | Status: DC

## 2021-08-08 NOTE — Progress Notes (Addendum)
BH MD/PA/NP OP Progress Note ? ?08/08/2021 4:21 PM ?Jake Neal  ?MRN:  [id number] ? ?Chief Complaint:  ?Chief Complaint  ?Patient presents with  ? Medication Management  ?  In Person MM F/U  ? Follow-up  ? ?HPI:  ? ?Jake Neal is a 31 year old male with a past psychiatric history significant for schizophrenia, generalized anxiety disorder, and PTSD who presents to [facility] for follow-up and medication management.  Patient is currently being managed on the following medications: ? ?Olanzapine 10 mg 3 times daily ?Prazosin 1 mg at bedtime ? ?Patient presents to this encounter with left-sided facial/cheek swelling.  Patient reports that his left cheek has been swollen for roughly 2 weeks and that he was given antibiotics at the urgent care roughly a week ago which caused the swelling to go down slightly.  Patient is requesting antibiotics for the alleviation of his left-sided facial/cheek swelling.  Provider informed patient that he should make arrangements with a dentist to determine the cause of infection before being prescribed more antibiotics.  Patient vocalized understanding. ? ?Patient reports that he has not been taking his prazosin 1 mg at bedtime.  When asked the reason for not taking the medication, patient responded saying that he just did not want to take it.  Patient still continues to endorse nightmares every night characterized by people after him.  Patient states that his dreams often leaves him more paranoid.  Patient endorses depressive symptoms that occur every day.  He states that his depressive symptoms are triggered by ruminating about someone out to harm him.  Patient endorses the following depressive symptoms: low mood, lack of motivation, decreased concentration, feelings of guilt/worthlessness, hopelessness, and irritability.  Patient also endorses anxiety and rates his anxiety as 7 out of 10.  Patient's main trigger to his anxiety is the  thought of random people out to hurt him.  A PHQ-9 screen was performed with the patient scoring a 23.  A GAD-7 screen was also performed with the patient scoring a 20. ? ?Patient is alert and oriented x4, calm, cooperative, and fully engaged in conversation during the encounter.  Patient endorses neutral mood and rates his mood a 5 out of 10.  Patient denies suicidal or homicidal ideations.  He further denies auditory or visual hallucinations and does not appear to be responding to internal/external stimuli.  Patient endorses fair sleep but is unable to quantify the amount of hours he receives each night.  Patient endorses decreased appetite and eats on average 1 meal per day.  Patient's decreased appetite is due to his left-sided facial/cheek swelling.  Patient denies alcohol consumption, tobacco use, and illicit drug use. ? ?Visit Diagnosis:  ?  ICD-10-CM   ?1. Nightmares  F51.5 prazosin (MINIPRESS) 1 MG capsule  ?  ?2. Schizophrenia, paranoid type (H[facility])  F20.0 OLANZapine (ZYPREXA) 10 MG tablet  ?  ? ? ?Past Psychiatric History:  ?Schizophrenia, paranoid type ?Generalized anxiety disorder ?PTSD ? ?Past Medical History:  ?Past Medical History:  ?Diagnosis Date  ? Anxiety   ? Depression   ? Medical history non-contributory   ? Schizophrenia ([facility])   ? History reviewed. No pertinent surgical history. ? ?Family Psychiatric History:  ?Unknown ? ?Family History: History reviewed. No pertinent family history. ? ?Social History:  ?Social History  ? ?Socioeconomic History  ? Marital status: Single  ?  Spouse name: Not on file  ? Number of children: Not on file  ? Years of education: Not  on file  ? Highest education level: Not on file  ?Occupational History  ? Not on file  ?Tobacco Use  ? Smoking status: Former  ? Smokeless tobacco: Never  ?Substance and Sexual Activity  ? Alcohol use: No  ? Drug use: Yes  ?  Types: Marijuana  ?  Comment: Currently denying substance use  ? Sexual activity: Yes  ?  Birth control/protection:  None  ?Other Topics Concern  ? Not on file  ?Social History Narrative  ? Not on file  ? ?Social Determinants of Health  ? ?Financial Resource Strain: Not on file  ?Food Insecurity: Not on file  ?Transportation Needs: Not on file  ?Physical Activity: Not on file  ?Stress: Not on file  ?Social Connections: Not on file  ? ? ?Allergies: No Known Allergies ? ?Metabolic Disorder Labs: ?Lab Results  ?Component Value Date  ? HGBA1C 4.9 10/26/2020  ? MPG 94 10/26/2020  ? MPG 96.8 10/22/2019  ? ?Lab Results  ?Component Value Date  ? PROLACTIN 9.9 07/06/2016  ? PROLACTIN 14.0 07/04/2016  ? ?Lab Results  ?Component Value Date  ? CHOL 234 (H) 10/26/2020  ? TRIG 52 10/26/2020  ? HDL 71 10/26/2020  ? CHOLHDL 3.3 10/26/2020  ? VLDL 10 10/26/2020  ? LDLCALC 153 (H) 10/26/2020  ? LDLCALC 137 (H) 10/22/2019  ? ?Lab Results  ?Component Value Date  ? TSH 3.799 10/26/2020  ? TSH 1.564 10/22/2019  ? ? ?Therapeutic Level Labs: ?No results found for: LITHIUM ?No results found for: VALPROATE ?No components found for:  CBMZ ? ?Current Medications: ?Current Outpatient Medications  ?Medication Sig Dispense Refill  ? OLANZapine (ZYPREXA) 10 MG tablet Take 1 tablet (10 mg total) by mouth 2 (two) times daily. 60 tablet 1  ? prazosin (MINIPRESS) 1 MG capsule Take 1 capsule (1 mg total) by mouth at bedtime. 30 capsule 1  ? ?No current facility-administered medications for this visit.  ? ? ? ?Musculoskeletal: ?Strength & Muscle Tone: within normal limits ?Gait & Station: normal ?Patient leans: N/A ? ?Psychiatric Specialty Exam: ?Review of Systems  ?Psychiatric/Behavioral:  Positive for behavioral problems and sleep disturbance. Negative for decreased concentration, dysphoric mood, hallucinations, self-injury and suicidal ideas. The patient is nervous/anxious. The patient is not hyperactive.    ?Blood pressure 103/64, pulse 79, height 6\' 2"  (1.88 m), weight 204 lb (92.5 kg).Body mass index is 26.19 kg/m?.  ?General Appearance: Casual  ?Eye Contact:   Good  ?Speech:  Clear and Coherent and Normal Rate  ?Volume:  Normal  ?Mood:  Anxious and Depressed  ?Affect:  Congruent  ?Thought Process:  Coherent and Descriptions of Associations: Intact  ?Orientation:  Full (Time, Place, and Person)  ?Thought Content: Paranoid Ideation and Rumination   ?Suicidal Thoughts:  No  ?Homicidal Thoughts:  No  ?Memory:  Immediate;   Fair ?Recent;   Fair ?Remote;   Fair  ?Judgement:  Impaired  ?Insight:  Lacking  ?Psychomotor Activity:  Psychomotor Retardation  ?Concentration:  Concentration: Good and Attention Span: Good  ?Recall:  Fair  ?Fund of Knowledge: Poor  ?Language: Good  ?Akathisia:  No  ?Handed:  Right  ?AIMS (if indicated): not done  ?Assets:  Communication Skills ?Desire for Improvement ?Housing ?Social Support  ?ADL's:  Impaired  ?Cognition: Impaired,  Mild  ?Sleep:  Fair  ? ?Screenings: ?AIMS   ? ?Flowsheet Row [facility]Dis[facility]) [facility] (Discharged) from OP Visit  [facility] Admission (Discharged) from OP Visit from 09[facility] Admission (Discharged) from OP Visit from 02/04/2019 in [facility]  ?AIMS Total Score 0 0 0 0 0  ? ?  ? ?AUDIT   ? ?Flowsheet Row Admission (Discharged) from 02/28/2020 in [facility] Admission (Discharged) from [facility] Admission (Discharged) from OP Visit from 10[facility] Admission (Discharged) from OP Visit from 02/04/2019 [facility] Admission (Discharged) from [facility]  ?Alcohol Use Disorder Identification Test Final Score (AUDIT) 1 0 0 2 0  ? ?  ? ?GAD-7   ? ?Flowsheet Row Office Visit from 08/08/2021  in [facility] Office Visit from 06/13/2021 in [facility] Office Visit from 04/11/2021 in [facility] Office Visit fr

## 2021-08-09 ENCOUNTER — Encounter: Payer: Self-pay | Admitting: Family Medicine

## 2021-08-09 ENCOUNTER — Ambulatory Visit (INDEPENDENT_AMBULATORY_CARE_PROVIDER_SITE_OTHER): Payer: Medicaid Other | Admitting: Family Medicine

## 2021-08-09 ENCOUNTER — Other Ambulatory Visit: Payer: Self-pay

## 2021-08-09 VITALS — BP 120/65 | HR 70 | Ht 73.0 in | Wt 203.4 lb

## 2021-08-09 DIAGNOSIS — R4586 Emotional lability: Secondary | ICD-10-CM

## 2021-08-09 DIAGNOSIS — K120 Recurrent oral aphthae: Secondary | ICD-10-CM

## 2021-08-09 DIAGNOSIS — Z8659 Personal history of other mental and behavioral disorders: Secondary | ICD-10-CM

## 2021-08-09 NOTE — Progress Notes (Signed)
? ? ?  SUBJECTIVE:  ? ?CHIEF COMPLAINT / HPI:  ? ?Patient presents with swelling in his mouth intermittently for a long time but worse over the past few weeks. He went to the urgent care prior to this and they gave him antibiotics which he completed the week's course for. Has been on the olanzapine for at least over a year. Has not seen a dentist recently. Denies fever, chills or other symptoms.  ? ?Patient has had history of suicidal ideations, had attempted in 2014 by bag over head to suffocate himself. Has SI for years but unable to recall exactly how long. Last had SI a week ago, denies having plan, does not get them every day. Denies current SI. Has says when he does have these thoughts they are brief and he quickly thinka bout something else  ?Sees psychiatrist every 2 months, just saw yesterday, wants to also see a therapist. Celine Ahr confirms that his mood changes have been an ongoing thing but although they see a psychiatrist for schizophrenia management, they would also like to see a therapist which he is currently not seeing.  ? ?OBJECTIVE:  ? ?BP 120/65   Pulse 70   Ht 6\' 1"  (1.854 m)   Wt 203 lb 6.4 oz (92.3 kg)   SpO2 95%   BMI 26.84 kg/m?   ?General: Patient well-appearing, in no acute distress. ?HEENT: no cervical LAD, very mild edema noted along lips, no erythema or edema noted, aphthous ulcer noted along the left buccal mucosa without drainage or erythema  ?Resp:CTAB ?Psych: mood appropriate, pleasant, denies current SI without plan, endorsing recent passive SI without a plan a week ago  ? ?ASSESSMENT/PLAN:  ? ?Aphthous ulcer ?-benign aphthous ulcer noted along left buccal mucosa, already improving and should spontaneously resolve, reassurance provided ?-has not been a recurring thing thus no need for antibiotics or further intervention ?-no indication of any systemic symptoms or infectious etiology  ?-discussed olanzapine possibly causing intermittent but ongoing mild facial edema, instructed to  discuss this with the psychiatrist  ?-instructed to visit the dentist at earliest convenience  ? ?History of suicidal ideation ?-PHQ-9 score of 19 with 3 for question 9 reviewed and extensively discussed ?-reassuringly no current SI and recent SI only remains to be passive without plan or intent, this has been an ongoing issue ?-continue to see psychiatrist routinely ?-advised to also participate in therapy as well, list of therapists provided along with psychology today resource  ?-suicide hotline provided ?-med red reviewed appropriately  ? ? ? ? , DO ?[facility]  ?

## 2021-08-09 NOTE — Patient Instructions (Addendum)
?It was great seeing you today! ? ?Today we discussed your swelling, it may be due to a small sore that is causing you irritation. It does not seem like anything is infected. You may apply ice to the area. Please call your insurance to choose a dentist that is covered so that you can get evaluated.  ? ?Continue to see the psychiatrist, I would ask about olanzapine since you have had intermittent swelling for awhile as this can cause swelling as well. Below is a list of therapists, you may also visit www.[email or web address] for more options.  ? ?If you are having thoughts of harming yourself then please call [phone].  ? ?Please follow up at your next scheduled appointment, if anything arises between now and then, please don't hesitate to contact our office. ? ? ?Thank you for allowing [place] to be a part of your medical care! ? ?Thank you, ?Dr. Larae Grooms  ? ? ?Therapy and Counseling Resources ?Most providers on this list will take Medicaid. Patients with commercial insurance or Medicare should contact their insurance company to get a list of in network providers. ? ?[facility] (spanish speaking therapist available)(habla espanol)  ?[place], [place], [place] [address], [place] ?[email or web address] ?[phone] ? ?[facility] ?[place] [place], [place] [address] ?[phone] ? ?[facility] ? [address], [place], [place] [address]      [phone] ? ?[facility] ?[place], [place] [address] ?[phone] ? ?[facility] ?[address] [place], [place] [address]       [phone]    ? ?[facility] (virtual only) ?[phone] ? ?[facility] ?[address], [place], [place] ? ?[facility]:  [address] ? ?[facility]:  ?[place]ton Golds [phone] ? ?[facility] (under  & uninsured) ?[address] [place] [phone]    [email or web address]   ? ?[facility] ?[place] [address]  [place]    [phone] ? ?[facility] ?[place]     Phone:  [phone]     [place]  [phone]  ? ?[facility] ?[address] [place], [place] ext [phone] ? ?[facility]: [place], [place], [place]  ? ?[facility] ([place] therapist) [email or web address]  ?[place] [address]   [place] [address]    [phone]   ? ?[facility]   ?[address] [place],  [place], [place]  ? ?[facility]  ?[address] [place]  [phone] ? ?[facility]  ?[place], [place]        [phone] ? ?Open Access/Walk In Clinic under & uninsured ? ?[facility]  ?[place], [place] ?[place] [phone] ?[place] [phone] ? ?[facility] [place],  ?([place])   [place]: [phone]) 8:30 - 12; 1 - 2:30 ? ?[facility] [place],  ?[address] [place]    [phone]):8:30 - 12; 2 - 3PM ? ?[facility],  ?[address],  [place]; [phone]):   Mon - Fri 8 AM - 5 PM ? ?[facility] ?[place]  MWF 12:30 to 3:00 or call to schedule an appointment  [phone] ? ?Specific Provider options ?Psychology Today  [email or web address] ?click on find a therapist  ?enter your zip code ?left side and select or tailor a therapist for your specific need.  ? ?[facility] Provider Directory ?[email or web address]  (Medicaid)   Follow all drop down to find a provider ? ?Social Support program ?[place] ?336) H3156881 or [email or web address] ?[address], [place], [place] [facility] support and educational  ? ?24- Hour Availability:  ? ?[facility]  ?[place], [place] ?[place] [phone] ?[place] [phone] ? ?[facility] [employer] [phone] ? ?[facility]  [phone]  ? ?[place]  [phone] (after hours) ? ?Therapeutic Alternative/Mobile Crisis   [phone] ? ?[place] National Suicide Hotline  (831)415-8921 Diamantina Monks) ? ?Call 911 or go to emergency room ? ?[employer]  [phone]);  [place] and [place]  ? ?[facility]  ?([phone]); [place], [place], [place], [place], [place], [place], [place]  ?

## 2021-08-11 DIAGNOSIS — K120 Recurrent oral aphthae: Secondary | ICD-10-CM | POA: Insufficient documentation

## 2021-08-11 DIAGNOSIS — Z8659 Personal history of other mental and behavioral disorders: Secondary | ICD-10-CM | POA: Insufficient documentation

## 2021-08-11 NOTE — Assessment & Plan Note (Addendum)
-  benign aphthous ulcer noted along left buccal mucosa, already improving and should spontaneously resolve, reassurance provided ?-has not been a recurring thing thus no need for antibiotics or further intervention ?-no indication of any systemic symptoms or infectious etiology  ?-discussed olanzapine possibly causing intermittent but ongoing mild facial edema, instructed to discuss this with the psychiatrist  ?-instructed to visit the dentist at earliest convenience  ?

## 2021-08-11 NOTE — Assessment & Plan Note (Signed)
-  PHQ-9 score of 19 with 3 for question 9 reviewed and extensively discussed ?-reassuringly no current SI and recent SI only remains to be passive without plan or intent, this has been an ongoing issue ?-continue to see psychiatrist routinely ?-advised to also participate in therapy as well, list of therapists provided along with psychology today resource  ?-suicide hotline provided ?-med red reviewed appropriately  ?

## 2021-10-10 ENCOUNTER — Encounter (HOSPITAL_COMMUNITY): Payer: Self-pay | Admitting: Physician Assistant

## 2021-10-10 ENCOUNTER — Ambulatory Visit (INDEPENDENT_AMBULATORY_CARE_PROVIDER_SITE_OTHER): Payer: Medicaid Other | Admitting: Physician Assistant

## 2021-10-10 VITALS — BP 106/73 | HR 74 | Ht 73.0 in | Wt 218.0 lb

## 2021-10-10 DIAGNOSIS — F200 Paranoid schizophrenia: Secondary | ICD-10-CM | POA: Diagnosis not present

## 2021-10-10 DIAGNOSIS — F329 Major depressive disorder, single episode, unspecified: Secondary | ICD-10-CM

## 2021-10-10 DIAGNOSIS — F4312 Post-traumatic stress disorder, chronic: Secondary | ICD-10-CM | POA: Diagnosis not present

## 2021-10-10 DIAGNOSIS — F411 Generalized anxiety disorder: Secondary | ICD-10-CM

## 2021-10-10 MED ORDER — FLUOXETINE HCL 10 MG PO CAPS: 10.0000 mg | ORAL_CAPSULE | Freq: Every day | ORAL | 1 refills | Status: DC

## 2021-10-13 ENCOUNTER — Encounter (HOSPITAL_COMMUNITY): Payer: Self-pay | Admitting: Physician Assistant

## 2021-10-13 MED ORDER — OLANZAPINE 10 MG PO TABS: 10.0000 mg | ORAL_TABLET | Freq: Two times a day (BID) | ORAL | 1 refills | Status: DC

## 2021-10-13 NOTE — Progress Notes (Addendum)
BH MD/PA/NP OP Progress Note  08/08/2021 4:21 PM Jake Neal  MRN:  [id number]  Chief Complaint:  Chief Complaint  Patient presents with   Medication Management    in person, f/u mm   HPI:   Jake Neal is a 31 year old male with a past psychiatric history significant for schizophrenia, generalized anxiety disorder, and PTSD who presents to [facility], accompanied by his aunt, for follow-up and medication management.  Patient is currently being managed on the following medications:  Olanzapine 10 mg 3 times daily Prazosin 1 mg at bedtime  Patient reports that his use of prazosin has not been going so well.  Per patient's aunt, patient experiences terrible headaches when taking the medication.  Patient to be taken off the medication following the conclusion of the encounter.  Patient reports that his mood has been all right but does continue to experience depression every day.  Patient's depression is characterized by the following symptoms: low mood, lack of motivation, irritability, and feelings of guilt/worthlessness.  Patient denies decreased concentration.  When asked what he felt guilty over, patient stated that he felt guilty over certain things in his life but did not want to elaborate.  Patient endorses anxiety and rates his anxiety at 6 out of 10.  Patient states that he is most anxious about not being able to do anything.  Patient denies any new stressors at this time and further denies any concerns regarding his mental health.  Per patient's aunt, patient has been talking a little more with her, but she reports that he can still come across as a bit moody.  A PHQ-9 screen was performed with the patient scoring a 23.  A GAD-7 screen was also performed with the patient scoring a 20.  Patient is alert and oriented x4, calm, cooperative, and fully engaged in conversation during the encounter.  Patient endorses feeling sad over a lot of  stuff.  Patient rates his mood as 5 out of 10.  Patient denies suicidal or homicidal ideations.  He further denies auditory or visual hallucinations and does not appear to be responding to internal/external stimuli.  Patient endorses fair sleep but is unable to quantify the amount of hours he receives each night.  Per patient's aunt, patient will often stay up late causing him to go to bed late and wake up later in the day.  Patient endorses decreased appetite and eats on average 1 meal per day along with a snack.  Patient denies alcohol consumption, tobacco use, and illicit drug use.  Visit Diagnosis:    ICD-10-CM   1. Schizophrenia, paranoid type (HCC)  F20.0 OLANZapine (ZYPREXA) 10 MG tablet    2. Generalized anxiety disorder  F41.1 FLUoxetine (PROZAC) 10 MG capsule    3. Major depressive disorder with single episode, remission status unspecified  F32.9 FLUoxetine (PROZAC) 10 MG capsule    4. Chronic post-traumatic stress disorder (PTSD)  F43.12 FLUoxetine (PROZAC) 10 MG capsule      Past Psychiatric History:  Schizophrenia, paranoid type Generalized anxiety disorder PTSD  Past Medical History:  Past Medical History:  Diagnosis Date   Anxiety    Depression    Medical history non-contributory    Schizophrenia (HCC)    History reviewed. No pertinent surgical history.  Family Psychiatric History:  Unknown  Family History: History reviewed. No pertinent family history.  Social History:  Social History   Socioeconomic History   Marital status: Single    Spouse name:  Not on file   Number of children: Not on file   Years of education: Not on file   Highest education level: Not on file  Occupational History   Not on file  Tobacco Use   Smoking status: Former   Smokeless tobacco: Never  Substance and Sexual Activity   Alcohol use: No   Drug use: Yes    Types: Marijuana    Comment: Currently denying substance use   Sexual activity: Yes    Birth control/protection: None   Other Topics Concern   Not on file  Social History Narrative   Not on file   Social Determinants of Health   Financial Resource Strain: Not on file  Food Insecurity: Not on file  Transportation Needs: Not on file  Physical Activity: Not on file  Stress: Not on file  Social Connections: Not on file    Allergies: No Known Allergies  Metabolic Disorder Labs: Lab Results  Component Value Date   HGBA1C 4.9 10/26/2020   MPG 94 10/26/2020   MPG 96.8 10/22/2019   Lab Results  Component Value Date   PROLACTIN 9.9 07/06/2016   PROLACTIN 14.0 07/04/2016   Lab Results  Component Value Date   CHOL 234 (H) 10/26/2020   TRIG 52 10/26/2020   HDL 71 10/26/2020   CHOLHDL 3.3 10/26/2020   VLDL 10 10/26/2020   LDLCALC 153 (H) 10/26/2020   LDLCALC 137 (H) 10/22/2019   Lab Results  Component Value Date   TSH 3.799 10/26/2020   TSH 1.564 10/22/2019    Therapeutic Level Labs: No results found for: LITHIUM No results found for: VALPROATE No components found for:  CBMZ  Current Medications: Current Outpatient Medications  Medication Sig Dispense Refill   FLUoxetine (PROZAC) 10 MG capsule Take 1 capsule (10 mg total) by mouth daily. 30 capsule 1   prazosin (MINIPRESS) 1 MG capsule Take 1 capsule (1 mg total) by mouth at bedtime. 30 capsule 1   OLANZapine (ZYPREXA) 10 MG tablet Take 1 tablet (10 mg total) by mouth 2 (two) times daily. 60 tablet 1   No current facility-administered medications for this visit.     Musculoskeletal: Strength & Muscle Tone: within normal limits Gait & Station: normal Patient leans: N/A  Psychiatric Specialty Exam: Review of Systems  Psychiatric/Behavioral:  Positive for behavioral problems and sleep disturbance. Negative for decreased concentration, dysphoric mood, hallucinations, self-injury and suicidal ideas. The patient is nervous/anxious. The patient is not hyperactive.    Blood pressure 106/73, pulse 74, height 6\' 1"  (1.854 m), weight 218  lb (98.9 kg).Body mass index is 28.76 kg/m.  General Appearance: Casual  Eye Contact:  Good  Speech:  Clear and Coherent and Normal Rate  Volume:  Normal  Mood:  Anxious and Depressed  Affect:  Congruent  Thought Process:  Coherent and Descriptions of Associations: Intact  Orientation:  Full (Time, Place, and Person)  Thought Content: Paranoid Ideation and Rumination   Suicidal Thoughts:  No  Homicidal Thoughts:  No  Memory:  Immediate;   Fair Recent;   Fair Remote;   Fair  Judgement:  Impaired  Insight:  Lacking  Psychomotor Activity:  Psychomotor Retardation  Concentration:  Concentration: Good and Attention Span: Good  Recall:  Fair  Fund of Knowledge: Poor  Language: Good  Akathisia:  No  Handed:  Right  AIMS (if indicated): not done  Assets:  Communication Skills Desire for Improvement Housing Social Support  ADL's:  Impaired  Cognition: Impaired,  Mild  Sleep:  Fair   Screenings: AIMS    Flowsheet Row [facility]) [facility] (Discharged) from OP Visit from [facility] Admission (Discharged) from OP Visit from [facility] Admission (Discharged) from OP Visit from 02/04/2019 [facility]  AIMS Total Score 0 0 0 0 0      AUDIT    Flowsheet Row Ad[facility]) [facility] Admission (Discharged) [facility] Admission (Discharged) from OP Visit from 10/[facility] Admission (Discharged) from OP Visit from 02/04/2019 [facility] Admission (Discharged) from [facility]  Alcohol Use Disorder Identification Test  Final Score (AUDIT) 1 0 0 2 0      GAD-7    Flowsheet Row [facility]20[facility]  Total GAD-7 Score 20 20 18 20 19       PHQ2-9    Flowsheet Row [facility]20[name] from [facility]20[facility]  PHQ-2 Total Score 6 3 5 5 5   PHQ-9 Total Score 23 19 23 23 18       Flowsheet Row [facility] from [facility]20[facility]  C-SSRS RISK CATEGORY Low Risk Low Risk No Risk        Assessment and Plan:   Jake Neal is a 31 year old male with a past psychiatric history significant for schizophrenia, generalized anxiety disorder, and PTSD [facility] accompanied by his aunt, for follow-up and medication management.  Patient reports that his use of prazosin has not been going well and that he experiences headaches when taking the medication.  Patient to be taken off the medication following the conclusion of the encounter.  Patient continues to endorse depression every day as well as anxiety.  Patient was recommended adding on fluoxetine 10 mg daily for the management of his depressive symptoms and anxiety.  Patient was agreeable to taking the medication.   Patient's medications to be e-prescribed to pharmacy of choice.  Collaboration of Care: Collaboration of Care: Medication Management AEB provider managing patient's psychiatric medications and Psychiatrist AEB patient being followed by mental health provider  Patient/Guardian was advised Release of Information must be obtained prior to any record release in order to collaborate their care with an outside provider. Patient/Guardian was advised if they have not already done so to contact the registration department to sign all necessary forms in order for us to release information regarding their care.   Consent: Patient/Guardian gives verbal consent for treatment and assignment of benefits for services provided during this visit. Patient/Guardian expressed understanding and agreed to proceed.  1. Schizophrenia, paranoid type (HCC)  - OLANZapine (ZYPREXA) 10 MG tablet; Take 1 tablet (10 mg total) by mouth 2 (two) times daily.  Dispense: 60 tablet; Refill: 1  2. Generalized anxiety disorder  - FLUoxetine (PROZAC) 10 MG capsule; Take 1 capsule (10 mg total) by mouth daily.  Dispense: 30 capsule; Refill: 1  3. Major depressive disorder with single episode, remission status unspecified  - FLUoxetine (PROZAC) 10 MG capsule; Take 1 capsule (10 mg total) by mouth daily.  Dispense: 30 capsule; Refill: 1  4. Chronic post-traumatic stress disorder (PTSD)  - FLUoxetine (PROZAC) 10 MG capsule; Take 1 capsule (10 mg total) by mouth daily.  Dispense: 30 capsule; Refill: 1  Patient to follow up in 2 months Provider spent a total of 13 minutes with the patient/reviewing patient's chart  Meta Hatchet, PA 08/08/2021, 4:21 PM

## 2021-12-12 ENCOUNTER — Encounter (HOSPITAL_COMMUNITY): Payer: Medicaid Other | Admitting: Physician Assistant

## 2022-01-22 ENCOUNTER — Encounter (HOSPITAL_COMMUNITY): Payer: Medicaid Other | Admitting: Psychiatry

## 2022-01-30 ENCOUNTER — Ambulatory Visit (INDEPENDENT_AMBULATORY_CARE_PROVIDER_SITE_OTHER): Payer: Medicaid Other | Admitting: Physician Assistant

## 2022-01-30 ENCOUNTER — Encounter (HOSPITAL_COMMUNITY): Payer: Self-pay | Admitting: Physician Assistant

## 2022-01-30 DIAGNOSIS — F200 Paranoid schizophrenia: Secondary | ICD-10-CM | POA: Diagnosis not present

## 2022-01-30 IMAGING — DX DG CHEST 2V
2 series · 2 of 2 positions shown · non-contrast
Comparison: July 04, 2016

CLINICAL DATA: Chest pain.

EXAM:
CHEST - 2 VIEW

[chest pa]
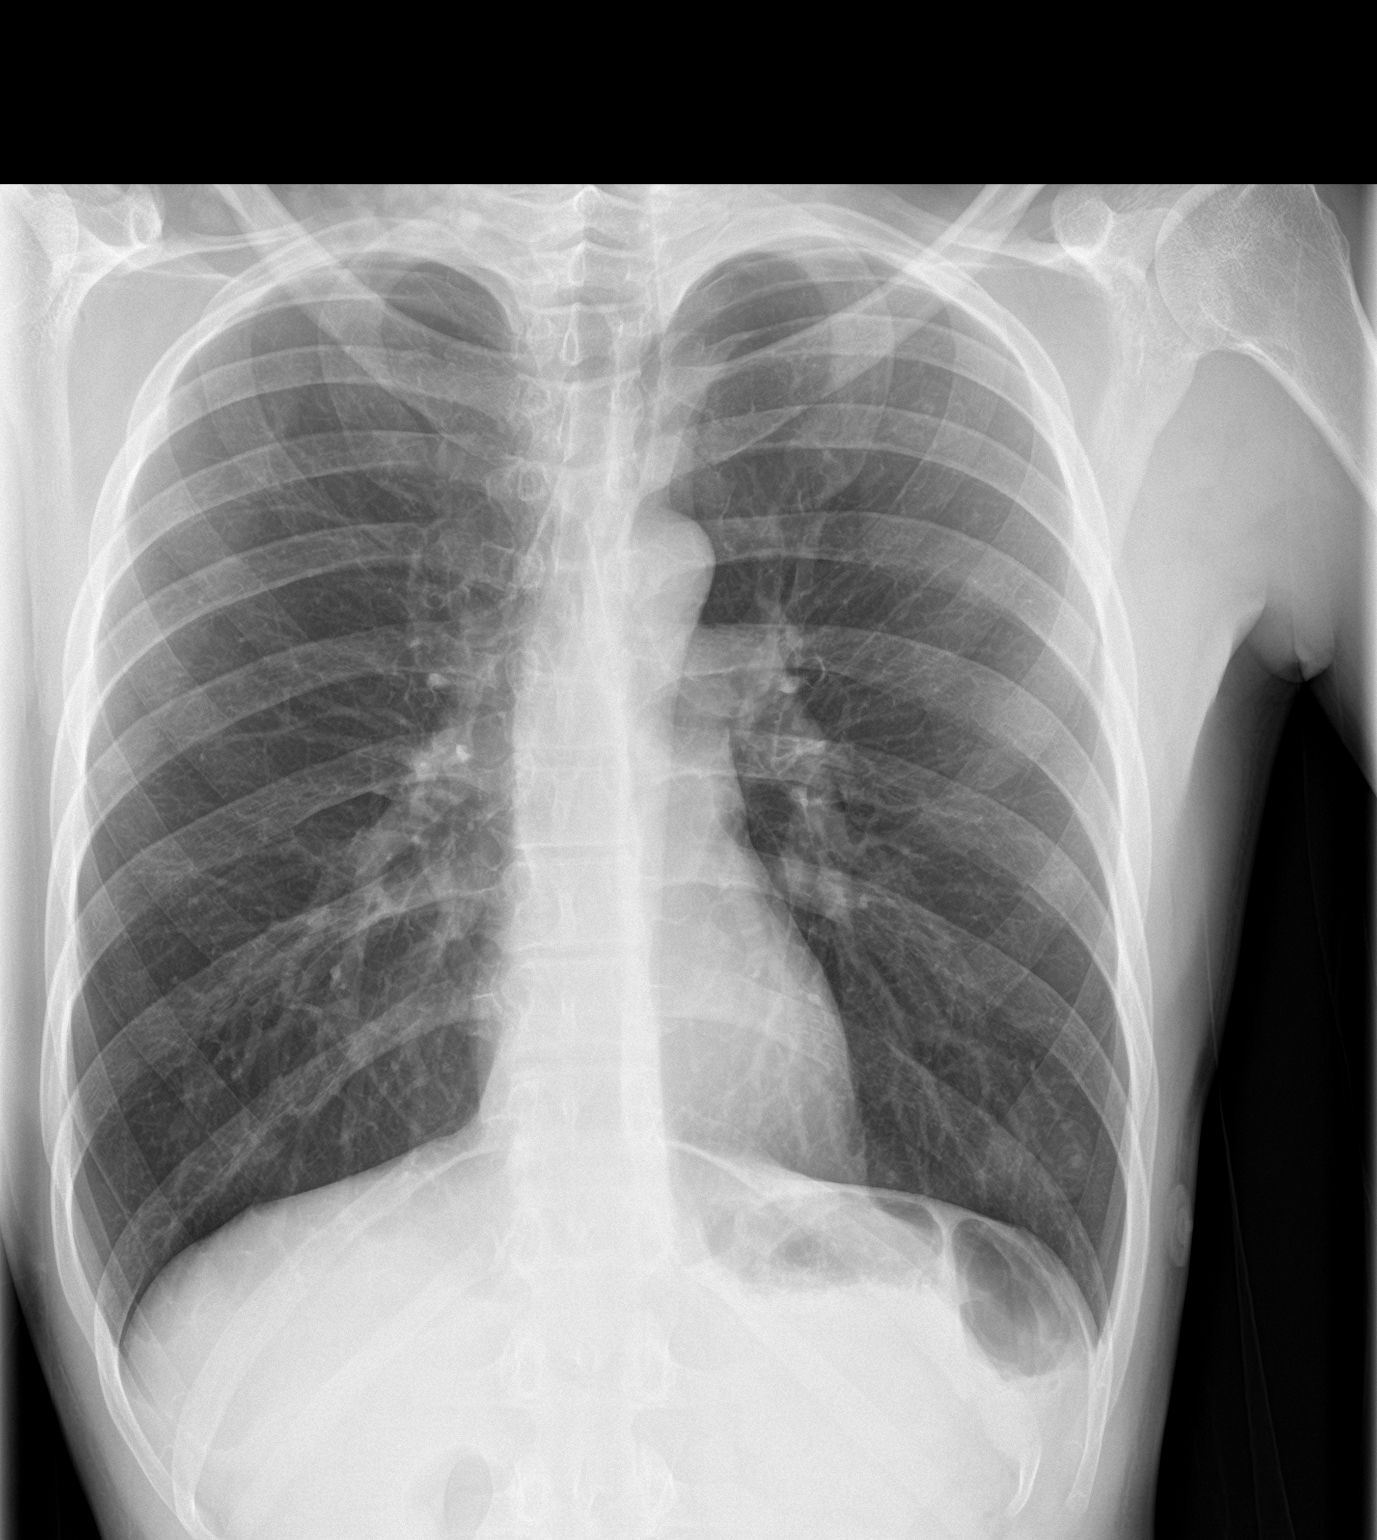

[chest lat]
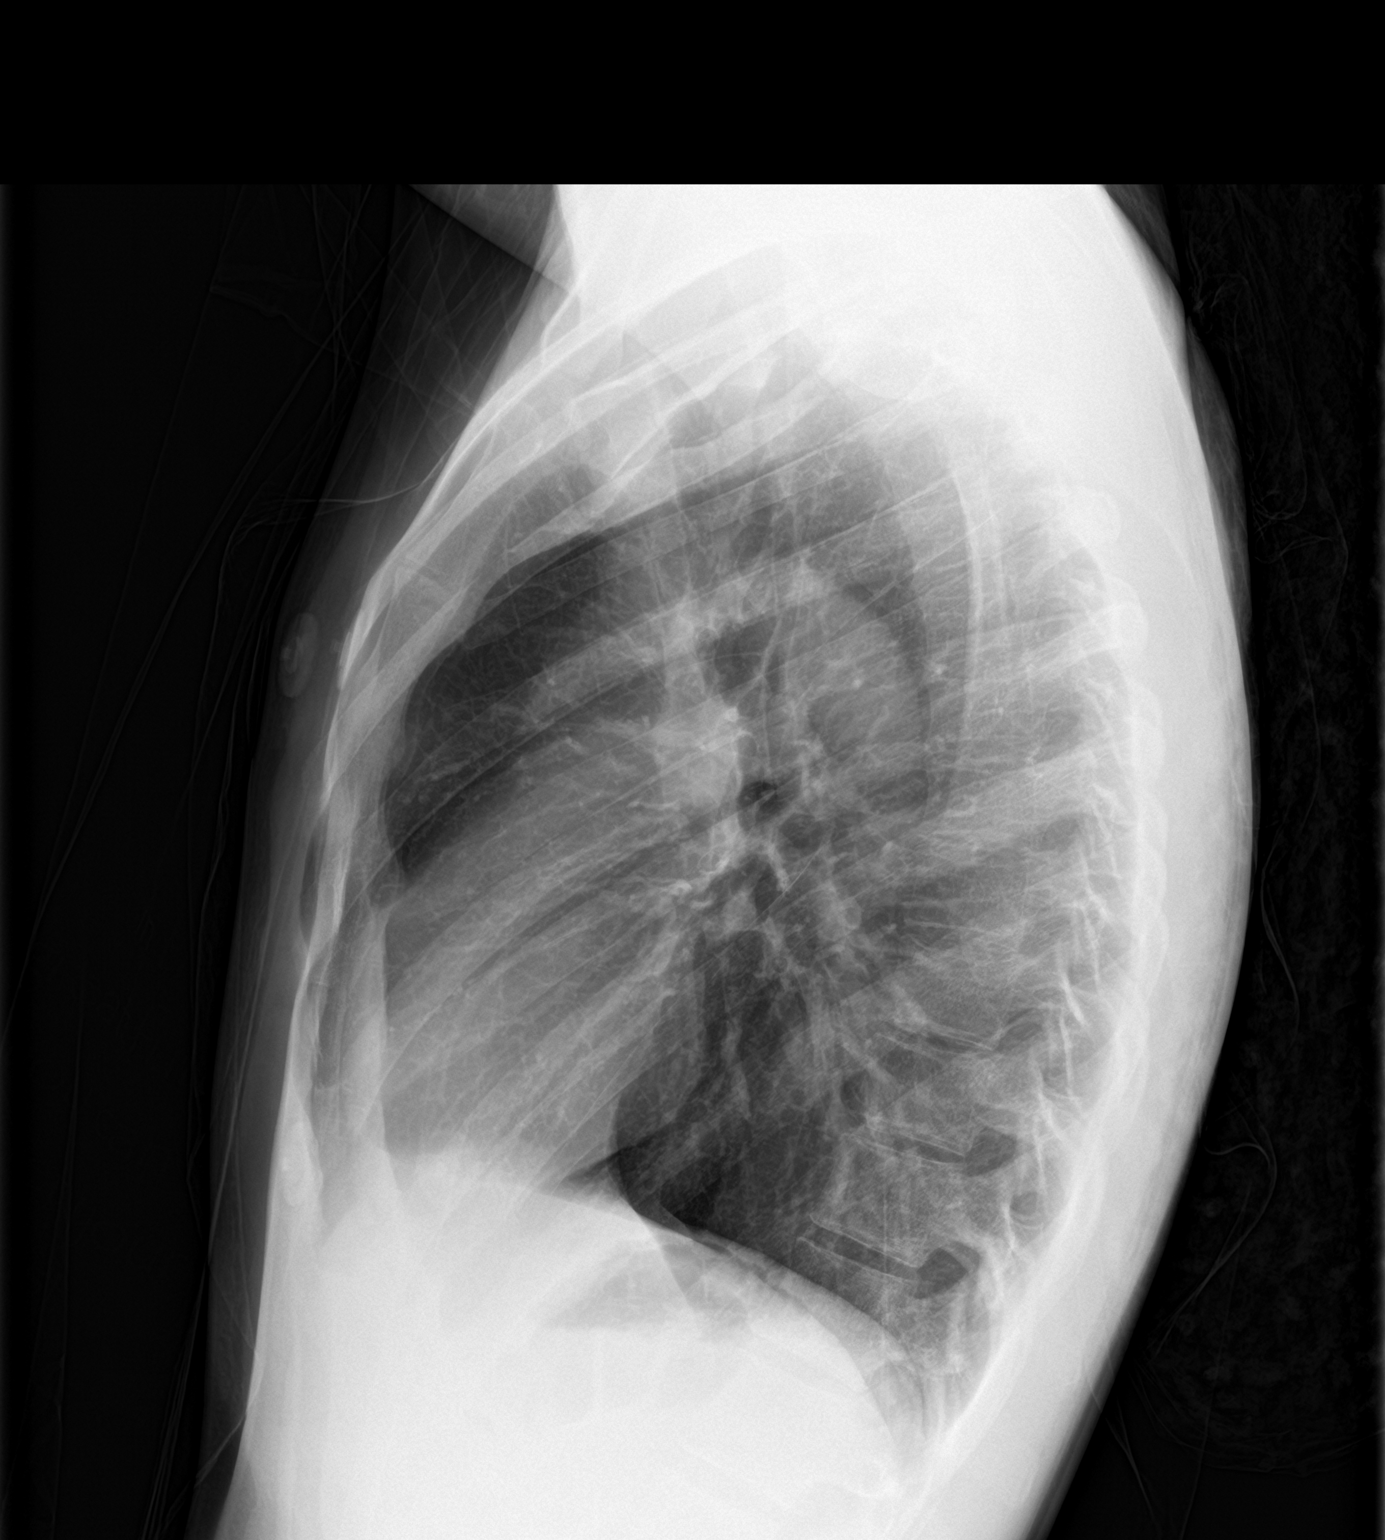

[2 of 2 positions shown; findings below may reference images not displayed]

FINDINGS: The heart size and mediastinal contours are within normal limits.
Both lungs are clear. The visualized skeletal structures are
unremarkable.
IMPRESSION: No active cardiopulmonary disease.

## 2022-01-30 MED ORDER — OLANZAPINE 10 MG PO TABS: 10.0000 mg | ORAL_TABLET | Freq: Two times a day (BID) | ORAL | 1 refills | Status: DC

## 2022-01-30 NOTE — Progress Notes (Signed)
BH MD/PA/NP OP Progress Note  01/30/2022 1:49 PM Jake Neal  MRN:  [id number]  Chief Complaint:  Chief Complaint  Patient presents with   Medication Management    F/U   HPI:   Jake Neal is a 31 year old male with a past psychiatric history significant for schizophrenia, generalized anxiety disorder, and PTSD who presents to [facility], accompanied by his aunt, for follow-up and medication management.  Patient is currently being managed on the following medications:  Olanzapine 10 mg 3 times daily Fluoxetine 10 mg daily  Patient reports that he has been doing all right and recently ran out of his medication 2 to 3 days ago.  When asked how his use of fluoxetine was going, patient denied taking his medication and stated that he was only taking olanzapine at this time.  Patient denies any issues with his olanzapine prescription.  He still continues to endorse depressive symptoms every day.  Patient's depressive episodes are characterized by the following symptoms: feelings of sadness lack of motivation, decreased concentration, decreased energy, and difficulty getting out of bed.  Patient denies irritability or changes in eating or sleeping patterns.  Patient endorses anxiety and rates his anxiety as 6 out of 10.  Patient states that he continues to be nervous about certain things such as feeling like someone is going to do something to him.  Patient denies any issues related to his mental health.  He does note that he is going back to school to obtain his GED.  A PHQ-9 screen was performed with the patient scored a 22.  A GAD-7 screen was 1 with the patient scoring a 19.  Patient is alert and oriented x4, calm, cooperative, and fully engaged in conversation during the encounter.  Patient endorses all right mood.  Patient endorses passive suicidal ideations with no plan.  He adds that he feels like something bad will happen to him.  Patient denies  homicidal ideations.  He further denies auditory or visual hallucinations and does not appear to be responding to external stimuli patient endorses fair sleep and receives on average 5 hours of sleep each night.  Patient endorses fair appetite and eats on average 1 meal per day.  Patient denies alcohol consumption, tobacco use, and illicit drug use.  Visit Diagnosis:    ICD-10-CM   1. Schizophrenia, paranoid type (HCC)  F20.0 OLANZapine (ZYPREXA) 10 MG tablet      Past Psychiatric History:  Schizophrenia, paranoid type Generalized anxiety disorder PTSD  Past Medical History:  Past Medical History:  Diagnosis Date   Anxiety    Depression    Medical history non-contributory    Schizophrenia (HCC)    No past surgical history on file.  Family Psychiatric History:  Unknown  Family History: No family history on file.  Social History:  Social History   Socioeconomic History   Marital status: Single    Spouse name: Not on file   Number of children: Not on file   Years of education: Not on file   Highest education level: Not on file  Occupational History   Not on file  Tobacco Use   Smoking status: Former   Smokeless tobacco: Never  Substance and Sexual Activity   Alcohol use: No   Drug use: Yes    Types: Marijuana    Comment: Currently denying substance use   Sexual activity: Yes    Birth control/protection: None  Other Topics Concern   Not on file  Social History Narrative   Not on file   Social Determinants of Health   Financial Resource Strain: Not on file  Food Insecurity: Not on file  Transportation Needs: Not on file  Physical Activity: Not on file  Stress: Not on file  Social Connections: Not on file    Allergies: No Known Allergies  Metabolic Disorder Labs: Lab Results  Component Value Date   HGBA1C 4.9 10/26/2020   MPG 94 10/26/2020   MPG 96.8 10/22/2019   Lab Results  Component Value Date   PROLACTIN 9.9 07/06/2016   PROLACTIN 14.0  07/04/2016   Lab Results  Component Value Date   CHOL 234 (H) 10/26/2020   TRIG 52 10/26/2020   HDL 71 10/26/2020   CHOLHDL 3.3 10/26/2020   VLDL 10 10/26/2020   LDLCALC 153 (H) 10/26/2020   LDLCALC 137 (H) 10/22/2019   Lab Results  Component Value Date   TSH 3.799 10/26/2020   TSH 1.564 10/22/2019    Therapeutic Level Labs: No results found for: "LITHIUM" No results found for: "VALPROATE" No results found for: "CBMZ"  Current Medications: Current Outpatient Medications  Medication Sig Dispense Refill   FLUoxetine (PROZAC) 10 MG capsule Take 1 capsule (10 mg total) by mouth daily. 30 capsule 1   OLANZapine (ZYPREXA) 10 MG tablet Take 1 tablet (10 mg total) by mouth 2 (two) times daily. 60 tablet 1   prazosin (MINIPRESS) 1 MG capsule Take 1 capsule (1 mg total) by mouth at bedtime. 30 capsule 1   No current facility-administered medications for this visit.     Musculoskeletal: Strength & Muscle Tone: within normal limits Gait & Station: normal Patient leans: N/A  Psychiatric Specialty Exam: Review of Systems  Psychiatric/Behavioral:  Positive for behavioral problems and sleep disturbance. Negative for decreased concentration, dysphoric mood, hallucinations, self-injury and suicidal ideas. The patient is nervous/anxious. The patient is not hyperactive.     Blood pressure 125/74, pulse (!) 58, height 6\' 1"  (1.854 m), weight 219 lb (99.3 kg).Body mass index is 28.89 kg/m.  General Appearance: Casual  Eye Contact:  Good  Speech:  Clear and Coherent and Normal Rate  Volume:  Normal  Mood:  Anxious and Depressed  Affect:  Congruent  Thought Process:  Coherent and Descriptions of Associations: Intact  Orientation:  Full (Time, Place, and Person)  Thought Content: Paranoid Ideation and Rumination   Suicidal Thoughts:  No  Homicidal Thoughts:  No  Memory:  Immediate;   Fair Recent;   Fair Remote;   Fair  Judgement:  Impaired  Insight:  Lacking  Psychomotor Activity:   Psychomotor Retardation  Concentration:  Concentration: Good and Attention Span: Good  Recall:  [place] of Knowledge: Poor  Language: Good  Akathisia:  No  Handed:  Right  AIMS (if indicated): not done  Assets:  Communication Skills Desire for Improvement Housing Social Support  ADL's:  Impaired  Cognition: Impaired,  Mild  Sleep:  Fair   Screenings: [place] Admission (Discharged) from 02/28/2020 in [place] Admission (Discharged) from 12/09/2019 in [place] [facility] Admission (Discharged) from OP Visit from 10/21/2019 in [place] [facility] Admission (Discharged) from OP Visit from 09/17/2019 in [place] Admission (Discharged) from OP Visit from 02/04/2019 in [place] Total Score 0 0 0 0 0      AUDIT    Flowsheet Row Admission (Discharged) from 02/28/2020 in [place]  [facility] Admission (Discharged) [facility] Ad[facility] (Discharged) from OP Visit from 10[facility] Admission (Discharged) from OP Visit from 02/04/2019 in [facility] Admission (Discharged) from [facility]/2018 [facility]  Alcohol Use Disorder Identification Test Final Score (AUDIT) 1 0 0 2 0      GAD-7    Flowsheet Row Clinical Support from 01/30/2022 in [facility] Office Visit from 10/10/2021 in [facility] Office Visit from 08/08/2021 in [facility] Office Visit from 06/13/2021 in [facility] Office Visit from 04/11/2021 in [facility]  Total GAD-7 Score 19 20 20 18 20       PHQ2-9    Flowsheet Row Clinical Support from 01/30/2022 in [facility] Office Visit from 10/10/2021 in [facility] Office Visit from 08/09/2021 in [facility] Office Visit from 08/08/2021 in [facility] Office Visit from 06/13/2021 in [facility]  PHQ-2 Total Score 5 6 3 5 5   PHQ-9 Total Score 22 23 19 23 23       Flowsheet Row Clinical Support from 01/30/2022 in [facility] Office Visit from 10/10/2021 in [facility] Office Visit from 08/08/2021 in [facility]  C-SSRS RISK CATEGORY Low Risk Low Risk Low Risk        Assessment and Plan:   Jake Neal is a 31 year old male with a past psychiatric history significant for schizophrenia, generalized anxiety disorder, and PTSD who presents to [facility], accompanied by his aunt, for follow-up and medication management.  Patient reports that he has not been taking fluoxetine and still patient denies any issues with his olanzapine but continues to endorse depressive symptoms and anxiety as well as some paranoia.  Provider encouraged patient to take fluoxetine for the management of his depressive symptoms and anxiety but patient refused.  Patient to continue taking olanzapine following the conclusion of the encounter.  Patient's medication to be prescribed to pharmacy of choice.  Collaboration of Care: Collaboration of Care: Medication Management AEB provider managing patient's psychiatric medications and Psychiatrist AEB patient being followed by mental health provider  Patient/Guardian was advised Release of Information must be obtained prior to any record release in order to collaborate their care with an outside provider. Patient/Guardian was advised if they have not already done so to contact the registration department to sign all necessary forms in order for Joseph Art to release  information regarding their care.   Consent: Patient/Guardian gives verbal consent for treatment and assignment of benefits for services provided during this visit. Patient/Guardian expressed understanding and agreed to proceed.   1. Schizophrenia, paranoid type (HCC)  - OLANZapine (ZYPREXA) 10 MG tablet; Take 1 tablet (10 mg total) by mouth 2 (two) times daily.  Dispense: 60 tablet; Refill: 1  Patient to follow up in 2 months Provider spent a total of 14 minutes with the patient/reviewing patient's chart  37, PA 01/30/2022, 1:49 PM

## 2022-01-31 IMAGING — CT CT ABD-PELV W/ CM
2 of 4 series · 15 of 46 positions shown, 17 images · IV contrast (APPLIED)
Comparison: None.

CLINICAL DATA: Nausea and vomiting. Lower abdominal pain. No bowel
movement within the past week. Acidosis.

EXAM:
CT ABDOMEN AND PELVIS WITH CONTRAST
TECHNIQUE: Multidetector CT imaging of the abdomen and pelvis was performed
using the standard protocol following bolus administration of
intravenous contrast.
CONTRAST:  80mL OMNIPAQUE IOHEXOL 300 MG/ML  SOLN

[Series 3: abdomen 5.0 · axial · 0.72mm/px · z∈[+957,+1357]mm · 12 of 92 slices shown, 14 images]
[im 6/92  soft-tissue]
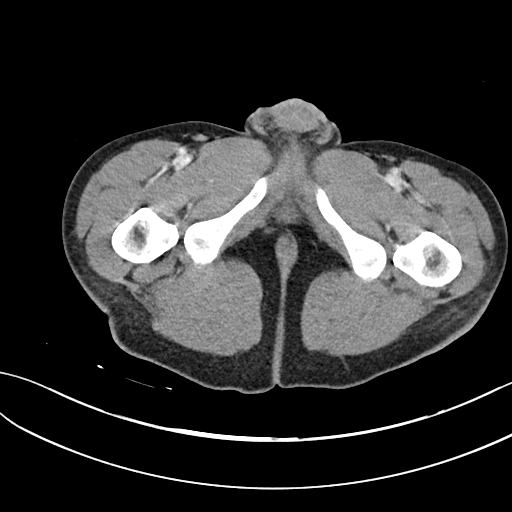
[im 6/92  bone]
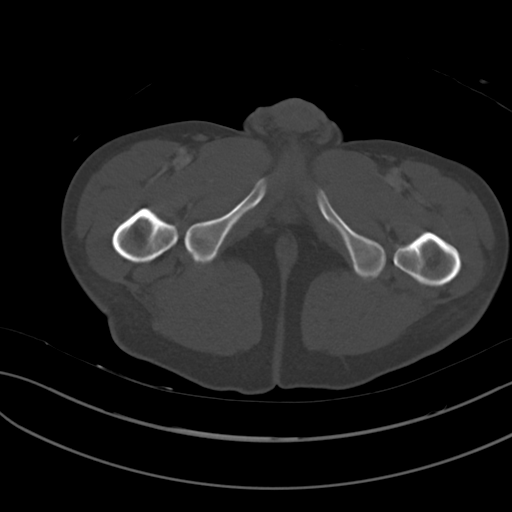
[im 16/92  soft-tissue]
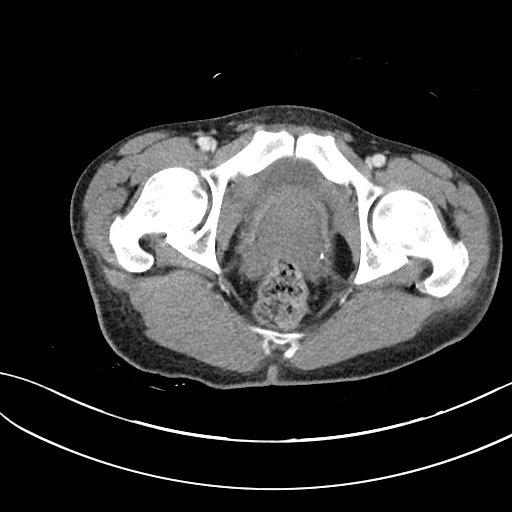
[im 21/92  soft-tissue]
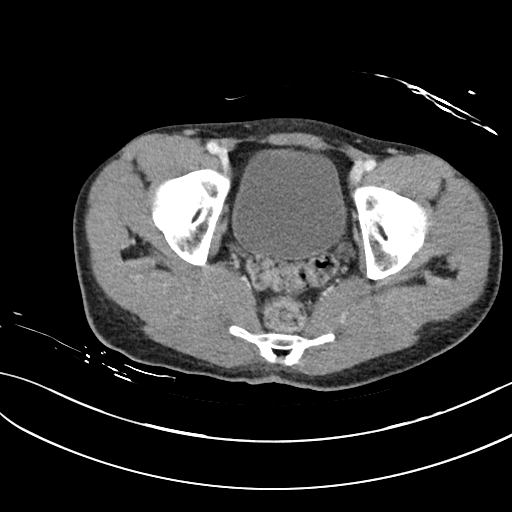
[im 26/92  soft-tissue]
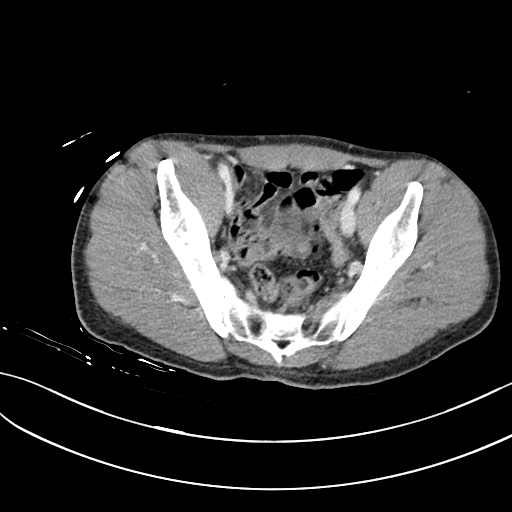
[im 36/92  soft-tissue]
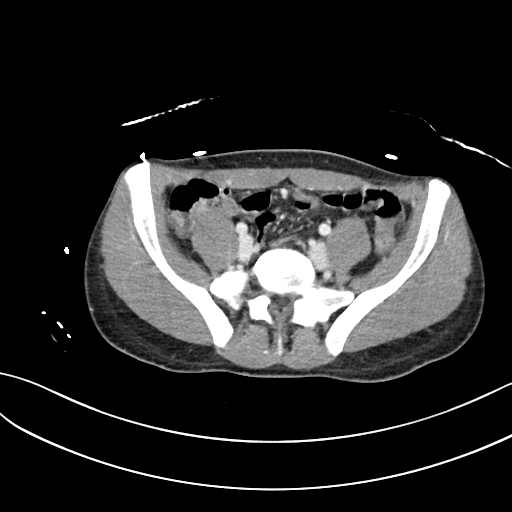
[im 41/92  soft-tissue]
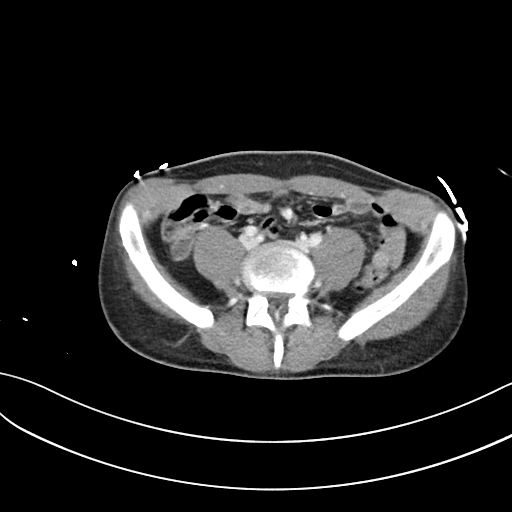
[im 51/92  soft-tissue]
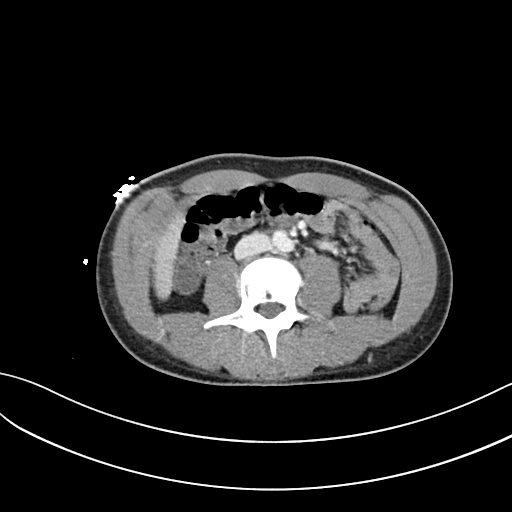
[im 56/92  soft-tissue]
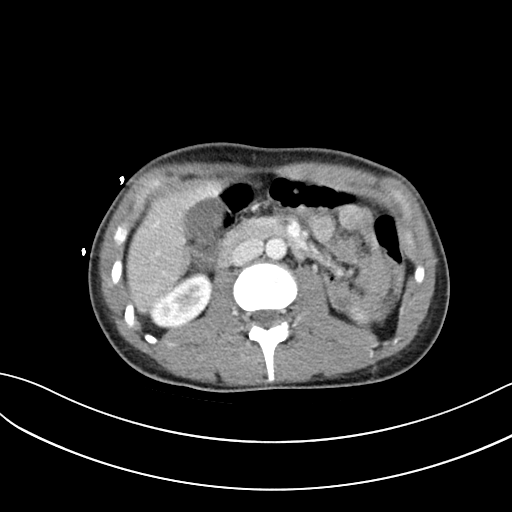
[im 66/92  soft-tissue]
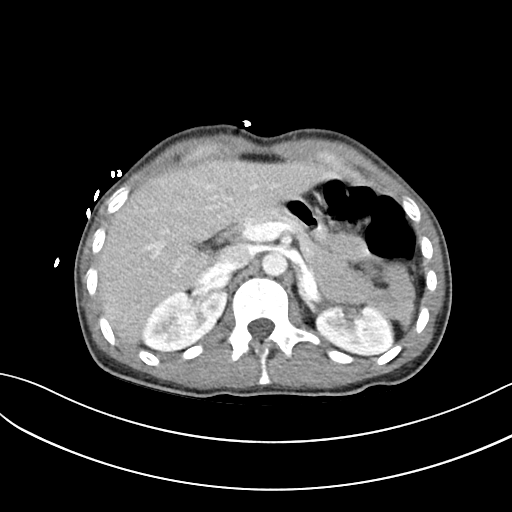
[im 66/92  bone]
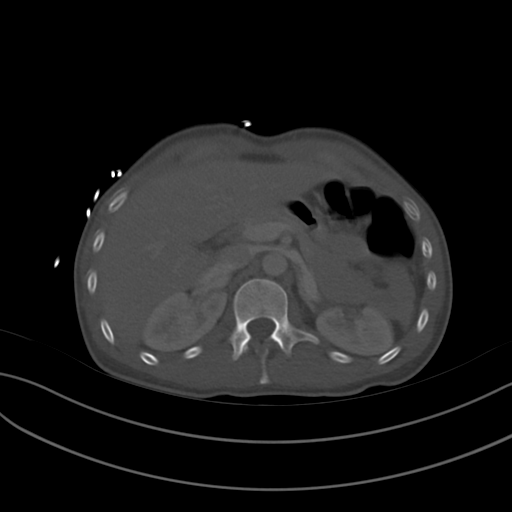
[im 71/92  soft-tissue]
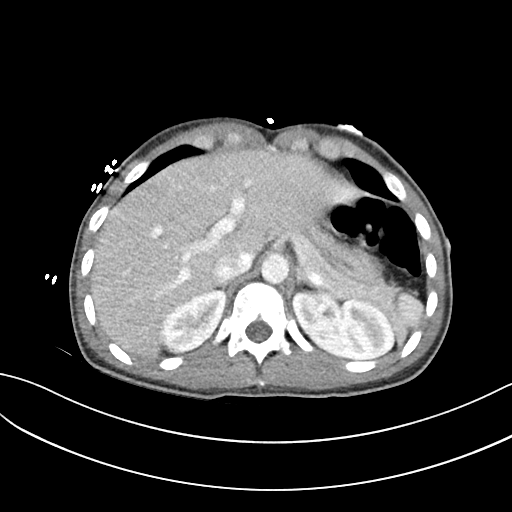
[im 76/92  soft-tissue]
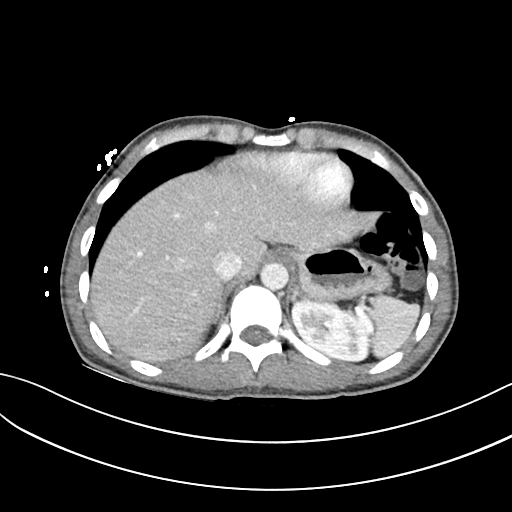
[im 86/92  soft-tissue]
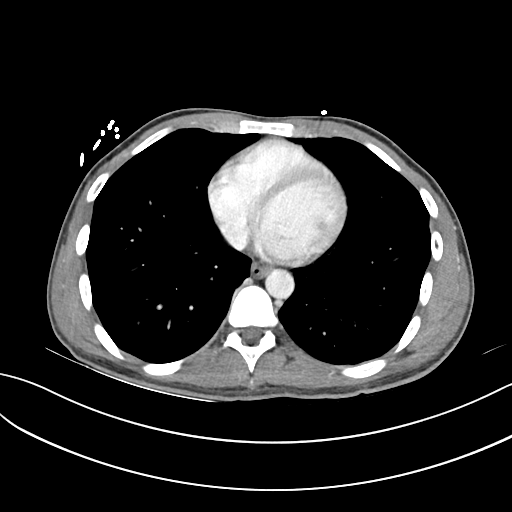

[Series 6: abdomen 3.0 mpr cor · coronal · 0.71mm/px · 3 of 68 slices shown]
[im 23/68  soft-tissue]
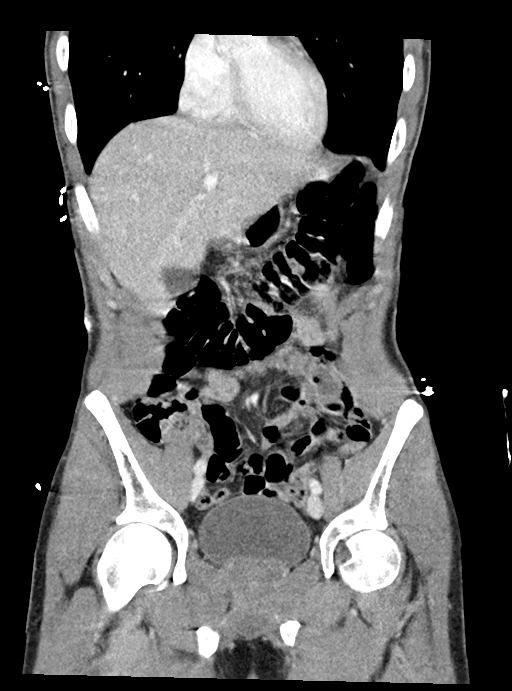
[im 30/68  soft-tissue]
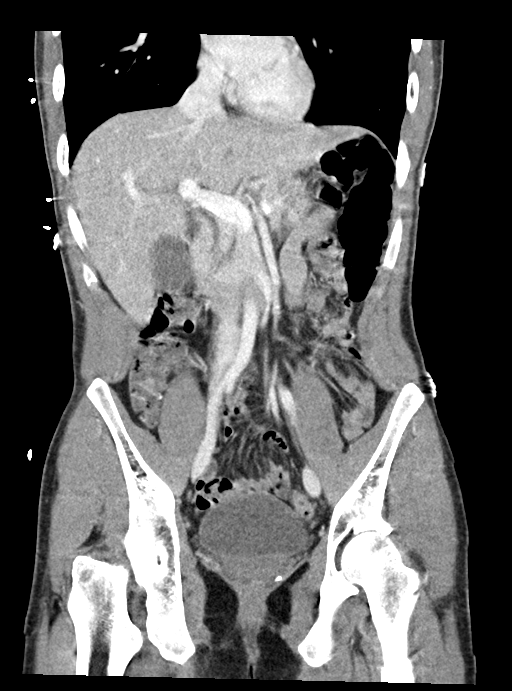
[im 38/68  soft-tissue]
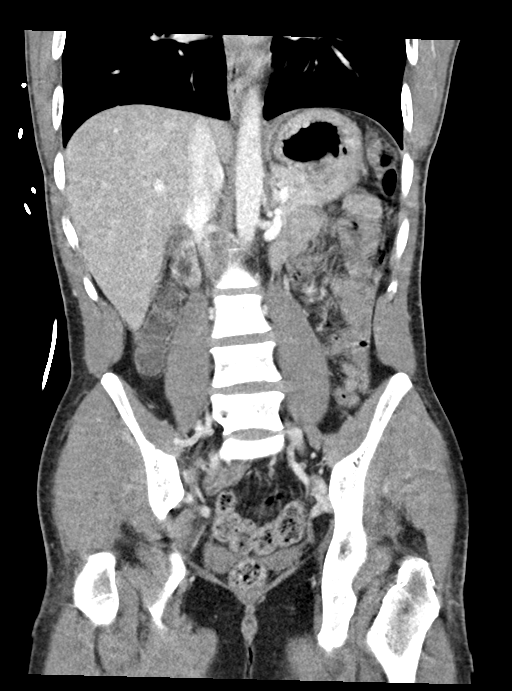

[15 of 46 positions shown; findings below may reference images not displayed]

FINDINGS: Examination is degraded due to patient motion artifact.

Lower chest: Limited visualization of the lower thorax demonstrates
minimal subsegmental atelectasis within the right middle lobe. No
focal airspace opacities. No pleural effusion.

Normal heart size.  No pericardial effusion.

Hepatobiliary: Normal hepatic contour. There is a minimal amount of
focal fatty infiltration adjacent to the fissure for ligamentum
teres. No discrete hepatic lesions. Normal appearance of the
gallbladder given degree distention. No radiopaque gallstones. No
intra extrahepatic bili duct dilatation. No ascites.

Pancreas: Normal appearance of the pancreas.

Spleen: The spleen is somewhat diminutive but otherwise normal in
appearance.

Adrenals/Urinary Tract: There is symmetric enhancement of the
bilateral kidneys. No definite renal stones this postcontrast
examination. No discrete renal lesions. No urine obstruction or
perinephric stranding.

Normal appearance of the bilateral adrenal glands.

Normal appearance of the urinary bladder given degree distention.

Stomach/Bowel: Moderate colonic stool burden without evidence of
enteric obstruction. Normal appearance of the terminal ileum and
appendix, a portion of which appears air-filled (image 57, series
3). No discrete areas of bowel wall thickening. No hiatal hernia. No
pneumoperitoneum, pneumatosis or portal venous gas.

Vascular/Lymphatic: Normal caliber of the abdominal aorta. The major
branch vessels of the abdominal aorta appear patent on this non CTA
examination.

No bulky retroperitoneal, mesenteric, pelvic or inguinal
lymphadenopathy.

Reproductive: Normal appearance of the prostate gland. Punctate
phleboliths are seen with the left hemipelvis. No free fluid the
pelvic cul-de-sac.

Other: There is a very minimal amount of subcutaneous edema about
the midline of the low back.

Musculoskeletal: No acute or aggressive osseous abnormalities. Note
is made of a right-sided L5-S1 assimilation joint.
IMPRESSION: No acute findings within the abdomen or pelvis. Specifically, no
evidence of enteric or urinary obstruction. Normal appearance of the
appendix.

## 2022-02-05 ENCOUNTER — Other Ambulatory Visit (HOSPITAL_COMMUNITY): Payer: Self-pay | Admitting: Psychiatry

## 2022-02-05 ENCOUNTER — Telehealth (HOSPITAL_COMMUNITY): Payer: Self-pay | Admitting: Physician Assistant

## 2022-02-05 DIAGNOSIS — F200 Paranoid schizophrenia: Secondary | ICD-10-CM

## 2022-02-05 DIAGNOSIS — F411 Generalized anxiety disorder: Secondary | ICD-10-CM

## 2022-02-05 DIAGNOSIS — F4312 Post-traumatic stress disorder, chronic: Secondary | ICD-10-CM

## 2022-02-05 DIAGNOSIS — F329 Major depressive disorder, single episode, unspecified: Secondary | ICD-10-CM

## 2022-02-05 MED ORDER — FLUOXETINE HCL 10 MG PO CAPS: 10.0000 mg | ORAL_CAPSULE | Freq: Every day | ORAL | 3 refills | Status: DC

## 2022-02-05 MED ORDER — OLANZAPINE 10 MG PO TABS: 10.0000 mg | ORAL_TABLET | Freq: Two times a day (BID) | ORAL | 3 refills | Status: DC

## 2022-02-05 NOTE — Telephone Encounter (Signed)
Medication refilled and sent to preferred pharmacy

## 2022-03-28 ENCOUNTER — Other Ambulatory Visit: Payer: Self-pay

## 2022-03-28 ENCOUNTER — Ambulatory Visit (INDEPENDENT_AMBULATORY_CARE_PROVIDER_SITE_OTHER): Payer: Self-pay | Admitting: Family Medicine

## 2022-03-28 ENCOUNTER — Other Ambulatory Visit (HOSPITAL_COMMUNITY): Payer: Self-pay

## 2022-03-28 VITALS — BP 126/80 | HR 97 | Wt 230.8 lb

## 2022-03-28 DIAGNOSIS — H5789 Other specified disorders of eye and adnexa: Secondary | ICD-10-CM

## 2022-03-28 MED ORDER — OLOPATADINE HCL 0.2 % OP SOLN
OPHTHALMIC | 0 refills | Status: DC
  Filled 2022-03-28: qty 2.5, 7d supply, fill #0

## 2022-03-28 NOTE — Progress Notes (Unsigned)
    SUBJECTIVE:   CHIEF COMPLAINT / HPI:   Patient presents for painful and swollen eyes. Started hurting about 3 days ago. Denies any discharge. Burns a little. Denies changes in vision. Does not wear contacts. Initially had sensitivity to light but states today is better. Has not tried anything or it.   PERTINENT  PMH / PSH: Reviewed   OBJECTIVE:   BP 126/80   Pulse 97   Wt 230 lb 12.8 oz (104.7 kg)   SpO2 97%   BMI 30.45 kg/m    Physical exam General: well appearing, NAD HEENT: Conjunctiva erythematous bilaterally. EOMI. PERRL  Cardiovascular: RRR, no murmurs Lungs: CTAB. Normal WOB Abdomen: soft, non-distended, non-tender Skin: warm, dry. No edema Neuro: alert and oriented. CN 2-12 in tact.   ASSESSMENT/PLAN:   No problem-specific Assessment & Plan notes found for this encounter.   Redness of both eyes Patient presents with 3 days of eye redness and burning without severe pain or vision changes. Physical exam reassuring with erythematous conjunctiva but normal pupillary response to light and extra ocular motions in tact without pain. Suspect this is allergic conjunctivitis and less likely episcleritis or uveitis given lack of pain or extreme sensitivity to light. Sent Rx for Olopatadine one drop in each eye or 7 days. Strict return precautions discussed if no improvement or worsening of symptoms.    [place]

## 2022-03-28 NOTE — Patient Instructions (Signed)
It was great seeing you today!  You came in for eye redness which is likely allergic.  I have prescribed drops to use each eye once daily for the next week.  Call if after a week does not improve, worsens, or you have new vision changes.   Feel free to call with any questions or concerns at any time, at [phone].   Take care,  Dr. Shary Key [facility]

## 2022-03-29 DIAGNOSIS — H5789 Other specified disorders of eye and adnexa: Secondary | ICD-10-CM | POA: Insufficient documentation

## 2022-03-29 NOTE — Assessment & Plan Note (Signed)
Patient presents with 3 days of eye redness and burning without severe pain or vision changes. Physical exam reassuring with erythematous conjunctiva but normal pupillary response to light and extra ocular motions in tact without pain. Suspect this is allergic conjunctivitis and less likely episcleritis or uveitis given lack of pain or extreme sensitivity to light. Sent Rx for Olopatadine one drop in each eye or 7 days. Strict return precautions discussed if no improvement or worsening of symptoms.
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
## 2022-04-03 ENCOUNTER — Encounter (HOSPITAL_COMMUNITY): Payer: Self-pay | Admitting: Psychiatry

## 2022-04-03 ENCOUNTER — Ambulatory Visit (INDEPENDENT_AMBULATORY_CARE_PROVIDER_SITE_OTHER): Payer: Medicaid Other | Admitting: Psychiatry

## 2022-04-03 ENCOUNTER — Encounter (HOSPITAL_COMMUNITY): Payer: Medicaid Other | Admitting: Physician Assistant

## 2022-04-03 ENCOUNTER — Other Ambulatory Visit (HOSPITAL_COMMUNITY): Payer: Self-pay

## 2022-04-03 VITALS — BP 109/70 | HR 70 | Ht 73.0 in | Wt 233.0 lb

## 2022-04-03 DIAGNOSIS — F200 Paranoid schizophrenia: Secondary | ICD-10-CM | POA: Diagnosis not present

## 2022-04-03 DIAGNOSIS — F4310 Post-traumatic stress disorder, unspecified: Secondary | ICD-10-CM

## 2022-04-03 DIAGNOSIS — F411 Generalized anxiety disorder: Secondary | ICD-10-CM

## 2022-04-03 MED ORDER — OLANZAPINE 10 MG PO TABS
20.0000 mg | ORAL_TABLET | Freq: Every evening | ORAL | 2 refills | Status: DC
  Filled 2022-04-03: qty 60, 30d supply, fill #0

## 2022-04-03 NOTE — Patient Instructions (Signed)
Thank you for attending your appointment today.  -- CONSOLIDATE Zyprexa to 20 mg all at nighttime -- When you follow-up with your PCP, please get a lipid profile and Hgb A1c -- Continue other medications as prescribed.  Please do not make any changes to medications without first discussing with your provider. If you are experiencing a psychiatric emergency, please call 911 or present to your nearest emergency department. Additional crisis, medication management, and therapy resources are included below.  [facility]  [address], [place], [place] [address] [phone] WALK-IN URGENT CARE 24/7 FOR ANYONE [address], [place], [place]  [phone] Fax: [phone] [email or web address] *Interpreters available *Accepts all insurance and uninsured for Urgent Care needs *Accepts Medicaid and uninsured for outpatient treatment (below)      ONLY FOR [facility]  Below:    Outpatient New Patient Assessment/Therapy Walk-ins:        Monday -Thursday 8am until slots are full.        Every Friday 1pm-4pm  (first come, first served)                   New Patient Psychiatry/Medication Management        Monday-Friday 8am-11am (first come, first served)               For all walk-ins we ask that you arrive by 7:15am, because patients will be seen in the order of arrival.

## 2022-04-18 ENCOUNTER — Other Ambulatory Visit (HOSPITAL_COMMUNITY): Payer: Self-pay

## 2022-05-12 ENCOUNTER — Encounter: Payer: Medicaid Other | Admitting: Family Medicine

## 2022-05-15 ENCOUNTER — Ambulatory Visit (INDEPENDENT_AMBULATORY_CARE_PROVIDER_SITE_OTHER): Payer: Medicaid Other | Admitting: Family Medicine

## 2022-05-15 ENCOUNTER — Encounter: Payer: Self-pay | Admitting: Family Medicine

## 2022-05-15 VITALS — BP 115/77 | HR 78 | Ht 73.0 in | Wt 231.6 lb

## 2022-05-15 DIAGNOSIS — Z79899 Other long term (current) drug therapy: Secondary | ICD-10-CM | POA: Diagnosis not present

## 2022-05-15 DIAGNOSIS — Z0001 Encounter for general adult medical examination with abnormal findings: Secondary | ICD-10-CM | POA: Diagnosis not present

## 2022-05-15 DIAGNOSIS — E669 Obesity, unspecified: Secondary | ICD-10-CM

## 2022-05-15 DIAGNOSIS — Z8659 Personal history of other mental and behavioral disorders: Secondary | ICD-10-CM

## 2022-05-15 DIAGNOSIS — F200 Paranoid schizophrenia: Secondary | ICD-10-CM | POA: Diagnosis not present

## 2022-05-15 DIAGNOSIS — Z114 Encounter for screening for human immunodeficiency virus [HIV]: Secondary | ICD-10-CM

## 2022-05-15 DIAGNOSIS — Z0000 Encounter for general adult medical examination without abnormal findings: Secondary | ICD-10-CM

## 2022-05-15 DIAGNOSIS — Z1159 Encounter for screening for other viral diseases: Secondary | ICD-10-CM

## 2022-05-15 DIAGNOSIS — H5789 Other specified disorders of eye and adnexa: Secondary | ICD-10-CM | POA: Diagnosis not present

## 2022-05-15 DIAGNOSIS — Z6830 Body mass index (BMI) 30.0-30.9, adult: Secondary | ICD-10-CM

## 2022-05-15 MED ORDER — OLOPATADINE HCL 0.2 % OP SOLN: OPHTHALMIC | 0 refills | Status: AC

## 2022-05-15 NOTE — Assessment & Plan Note (Signed)
Had improvement after olopatadine but has had recurrent in the last few days. Does not appear to be infectious in etiology, likely allergic as previously noted. Does note some changes in his vision, not associated with current symptoms  - continue olopatadine as needed - educated on rebound symptoms with Visine use - return precautions given - recommended optometry exam

## 2022-05-15 NOTE — Assessment & Plan Note (Signed)
PHQ-9 score of 14 with a number 2 for the SI question. Patient reports that he has not had these feelings in quite some time and does not intend to cause himself or others harm. Has a good support system and protective factors.

## 2022-05-15 NOTE — Patient Instructions (Signed)
We are going to get labs today.   I think that the pain in your leg is more related to your hamstring being overstretched or strained.  I recommend doing some smaller exercises and stretches as warm up before you go walking for longer times and if you are sitting for long periods of time interrupting that with the stretches.  For the eye redness, if the olopatadine is working then I would recommend using that  For the ear drops, you can get Debrox over-the-counter solution to help loosen up the wax in the right ear.

## 2022-05-15 NOTE — Progress Notes (Signed)
Subjective:  CC -- Annual Physical; With complaints as below  Left leg numbness - Present in the back of his knee and will make his calf feel numb - Has been present for a few months and is getting worse - No known injury - Does hurt worse with walking or sitting for too long - Has not used any medications - Does not stretch or exercise  - Denies claudication symptoms  Eye redness  - used OTC olopatadine drops for 2 weeks - Eyes cleared up for a few weeks and then they were bloodshot again - Trialed visine but noted redness in the last few days again - Has some blurriness with reading  Cardiovascular: - Dx Hypertension: no  - Dx Hyperlipidemia: no  - Dx Obesity: yes  - Physical Activity: no  - Diabetes: no   Social: Alcohol Use: no  Tobacco Use: no  Other Drugs: no  Risky Sexual Behavior: no  Depression: yes   - PHQ9 score: 14 Support and Life at Home: yes   Other: Flu Vaccine: no, declines   Past Medical History Patient Active Problem List   Diagnosis Date Noted   Healthcare maintenance 05/15/2022   Redness of both eyes 03/29/2022   History of suicidal ideation 08/11/2021   Nightmares 04/11/2021   MDD (major depressive disorder), recurrent episode (HCC) 07/03/2020   Generalized anxiety disorder 06/27/2020   Schizophrenia (HCC) 02/28/2020   PTSD (post-traumatic stress disorder) 01/28/2019    Medications- reviewed and updated Current Outpatient Medications  Medication Sig Dispense Refill   OLANZapine (ZYPREXA) 10 MG tablet Take 2 tablets (20 mg total) by mouth at bedtime. 60 tablet 2   Olopatadine HCl 0.2 % SOLN Use 1 drop in each eye daily for 7 days 2.5 mL 0   No current facility-administered medications for this visit.    Objective: BP 115/77   Pulse 78   Ht 6\' 1"  (1.854 m)   Wt 231 lb 9.6 oz (105.1 kg)   SpO2 100%   BMI 30.56 kg/m  Gen: NAD, alert, cooperative with exam HEENT: NCAT, EOMI, PERRL, conjunctival injection present without  discharge CV: RRR, good S1/S2, no murmur Resp: CTABL, no wheezes, non-labored Abd: Soft, Non Tender, Non Distended, BS present, no guarding or organomegaly Genital Exam: not done Ext: No edema, warm Neuro: Alert and oriented, No gross deficits MSK: Left lower leg with tenderness upon palpation of the insertion point of the hamstring which reproduces symptoms of numbness down the calf, no obvious cyst or swelling of the leg. Negative [place], no pain with palpation of the meniscus or joint line. Negative straight leg raise   Assessment/Plan:  Schizophrenia (HCC) Has not had monitoring labs on zyprexa for the last 1.5 years, will obtain today.  - CBC, CMP, A1c, Lipid panel  Healthcare maintenance - HIV screening today - Hep C screening today - Declines Flu and COVID vaccines  Redness of both eyes Had improvement after olopatadine but has had recurrent in the last few days. Does not appear to be infectious in etiology, likely allergic as previously noted. Does note some changes in his vision, not associated with current symptoms  - continue olopatadine as needed - educated on rebound symptoms with Visine use - return precautions given - recommended optometry exam  History of suicidal ideation PHQ-9 score of 14 with a number 2 for the SI question. Patient reports that he has not had these feelings in quite some time and does not intend to cause himself or others  harm. Has a good support system and protective factors.    Orders Placed This Encounter  Procedures   Comprehensive metabolic panel   CBC with Differential   Hemoglobin A1c   Lipid Panel   HIV antibody (with reflex)   Hepatitis C antibody (reflex, frozen specimen)    Meds ordered this encounter  Medications   Olopatadine HCl 0.2 % SOLN    Sig: Use 1 drop in each eye daily for 7 days    Dispense:  2.5 mL    Refill:  0     Chantee Cerino, DO, PGY-3 05/15/2022 12:42 PM

## 2022-05-15 NOTE — Assessment & Plan Note (Signed)
Has not had monitoring labs on zyprexa for the last 1.5 years, will obtain today.  - CBC, CMP, A1c, Lipid panel

## 2022-05-15 NOTE — Assessment & Plan Note (Signed)
-   HIV screening today - Hep C screening today - Declines Flu and COVID vaccines

## 2022-05-16 ENCOUNTER — Encounter: Payer: Self-pay | Admitting: Family Medicine

## 2022-05-16 LAB — CBC WITH DIFFERENTIAL/PLATELET
Basophils Absolute: 0 10*3/uL (ref 0.0–0.2)
Basos: 1 %
EOS (ABSOLUTE): 0 10*3/uL (ref 0.0–0.4)
Eos: 1 %
Hematocrit: 41.6 % (ref 37.5–51.0)
Hemoglobin: 14.3 g/dL (ref 13.0–17.7)
Immature Grans (Abs): 0 10*3/uL (ref 0.0–0.1)
Immature Granulocytes: 0 %
Lymphocytes Absolute: 2.2 10*3/uL (ref 0.7–3.1)
Lymphs: 38 %
MCH: 31.8 pg (ref 26.6–33.0)
MCHC: 34.4 g/dL (ref 31.5–35.7)
MCV: 93 fL (ref 79–97)
Monocytes Absolute: 0.4 10*3/uL (ref 0.1–0.9)
Monocytes: 7 %
Neutrophils Absolute: 3.1 10*3/uL (ref 1.4–7.0)
Neutrophils: 53 %
Platelets: 136 10*3/uL — ABNORMAL LOW (ref 150–450)
RBC: 4.49 x10E6/uL (ref 4.14–5.80)
RDW: 13.5 % (ref 11.6–15.4)
WBC: 5.7 10*3/uL (ref 3.4–10.8)

## 2022-05-16 LAB — HEMOGLOBIN A1C
Est. average glucose Bld gHb Est-mCnc: 111 mg/dL
Hgb A1c MFr Bld: 5.5 % (ref 4.8–5.6)

## 2022-05-16 LAB — COMPREHENSIVE METABOLIC PANEL
ALT: 38 IU/L (ref 0–44)
AST: 22 IU/L (ref 0–40)
Albumin/Globulin Ratio: 1.7 (ref 1.2–2.2)
Albumin: 4.5 g/dL (ref 4.3–5.2)
Alkaline Phosphatase: 92 IU/L (ref 44–121)
BUN/Creatinine Ratio: 13 (ref 9–20)
BUN: 16 mg/dL (ref 6–20)
Bilirubin Total: 0.9 mg/dL (ref 0.0–1.2)
CO2: 22 mmol/L (ref 20–29)
Calcium: 9.5 mg/dL (ref 8.7–10.2)
Chloride: 100 mmol/L (ref 96–106)
Creatinine, Ser: 1.23 mg/dL (ref 0.76–1.27)
Globulin, Total: 2.6 g/dL (ref 1.5–4.5)
Glucose: 82 mg/dL (ref 70–99)
Potassium: 4.4 mmol/L (ref 3.5–5.2)
Sodium: 138 mmol/L (ref 134–144)
Total Protein: 7.1 g/dL (ref 6.0–8.5)
eGFR: 81 mL/min/{1.73_m2} (ref >59)

## 2022-05-16 LAB — HIV ANTIBODY (ROUTINE TESTING W REFLEX): HIV Screen 4th Generation wRfx: NONREACTIVE

## 2022-05-16 LAB — LIPID PANEL
Chol/HDL Ratio: 4.9 ratio (ref 0.0–5.0)
Cholesterol, Total: 194 mg/dL (ref 100–199)
HDL: 40 mg/dL (ref >39)
LDL Chol Calc (NIH): 132 mg/dL — ABNORMAL HIGH (ref 0–99)
Triglycerides: 121 mg/dL (ref 0–149)
VLDL Cholesterol Cal: 22 mg/dL (ref 5–40)

## 2022-05-16 LAB — HCV INTERPRETATION

## 2022-05-16 LAB — HCV AB W REFLEX TO QUANT PCR: HCV Ab: NONREACTIVE

## 2022-06-10 ENCOUNTER — Ambulatory Visit (HOSPITAL_COMMUNITY): Payer: Medicaid Other | Admitting: Psychiatry

## 2022-06-10 VITALS — BP 121/77 | HR 86 | Wt 229.0 lb

## 2022-06-10 DIAGNOSIS — F4310 Post-traumatic stress disorder, unspecified: Secondary | ICD-10-CM

## 2022-06-10 DIAGNOSIS — F200 Paranoid schizophrenia: Secondary | ICD-10-CM

## 2022-06-10 MED ORDER — OLANZAPINE 10 MG PO TABS: 20.0000 mg | ORAL_TABLET | Freq: Every evening | ORAL | 2 refills | Status: DC
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
## 2022-08-12 ENCOUNTER — Ambulatory Visit (INDEPENDENT_AMBULATORY_CARE_PROVIDER_SITE_OTHER): Payer: Medicaid Other | Admitting: Physician Assistant

## 2022-08-12 ENCOUNTER — Encounter (HOSPITAL_COMMUNITY): Payer: Self-pay | Admitting: Physician Assistant

## 2022-08-12 DIAGNOSIS — F200 Paranoid schizophrenia: Secondary | ICD-10-CM

## 2022-08-12 MED ORDER — OLANZAPINE 10 MG PO TABS: 10.0000 mg | ORAL_TABLET | Freq: Two times a day (BID) | ORAL | 2 refills | Status: DC
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
## 2022-08-25 ENCOUNTER — Telehealth (HOSPITAL_COMMUNITY): Payer: Self-pay | Admitting: Physician Assistant

## 2022-09-09 ENCOUNTER — Encounter (HOSPITAL_COMMUNITY): Payer: Self-pay | Admitting: Physician Assistant

## 2022-09-24 ENCOUNTER — Other Ambulatory Visit (HOSPITAL_COMMUNITY): Payer: Self-pay

## 2022-10-23 ENCOUNTER — Ambulatory Visit (INDEPENDENT_AMBULATORY_CARE_PROVIDER_SITE_OTHER): Payer: Medicaid Other | Admitting: Physician Assistant

## 2022-10-23 DIAGNOSIS — F200 Paranoid schizophrenia: Secondary | ICD-10-CM

## 2022-10-23 MED ORDER — OLANZAPINE 10 MG PO TABS: 10.0000 mg | ORAL_TABLET | Freq: Every day | ORAL | 2 refills | Status: DC
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
## 2022-10-26 ENCOUNTER — Encounter (HOSPITAL_COMMUNITY): Payer: Self-pay | Admitting: Physician Assistant

## 2022-12-24 ENCOUNTER — Ambulatory Visit (INDEPENDENT_AMBULATORY_CARE_PROVIDER_SITE_OTHER): Payer: MEDICAID | Admitting: Physician Assistant

## 2022-12-24 ENCOUNTER — Encounter (HOSPITAL_COMMUNITY): Payer: Self-pay | Admitting: Physician Assistant

## 2022-12-24 VITALS — BP 113/85 | HR 61 | Temp 98.2°F | Ht 71.5 in | Wt 205.2 lb

## 2022-12-24 DIAGNOSIS — F4310 Post-traumatic stress disorder, unspecified: Secondary | ICD-10-CM

## 2022-12-24 DIAGNOSIS — F329 Major depressive disorder, single episode, unspecified: Secondary | ICD-10-CM

## 2022-12-24 DIAGNOSIS — F411 Generalized anxiety disorder: Secondary | ICD-10-CM | POA: Diagnosis not present

## 2022-12-24 DIAGNOSIS — F200 Paranoid schizophrenia: Secondary | ICD-10-CM

## 2022-12-24 DIAGNOSIS — Z79899 Other long term (current) drug therapy: Secondary | ICD-10-CM

## 2022-12-24 MED ORDER — OLANZAPINE 10 MG PO TABS: 10.0000 mg | ORAL_TABLET | Freq: Every day | ORAL | 2 refills | Status: DC
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
## 2023-02-25 ENCOUNTER — Encounter (HOSPITAL_COMMUNITY): Payer: Medicaid Other | Admitting: Physician Assistant

## 2023-03-05 ENCOUNTER — Ambulatory Visit (HOSPITAL_COMMUNITY): Payer: MEDICAID | Admitting: Physician Assistant

## 2023-03-05 ENCOUNTER — Encounter (HOSPITAL_COMMUNITY): Payer: Self-pay | Admitting: Physician Assistant

## 2023-03-05 VITALS — BP 119/84 | HR 90 | Temp 98.6°F | Ht 71.0 in | Wt 196.4 lb

## 2023-03-05 DIAGNOSIS — F411 Generalized anxiety disorder: Secondary | ICD-10-CM

## 2023-03-05 DIAGNOSIS — F329 Major depressive disorder, single episode, unspecified: Secondary | ICD-10-CM

## 2023-03-05 DIAGNOSIS — F4310 Post-traumatic stress disorder, unspecified: Secondary | ICD-10-CM

## 2023-03-05 DIAGNOSIS — F200 Paranoid schizophrenia: Secondary | ICD-10-CM | POA: Diagnosis not present

## 2023-03-05 MED ORDER — OLANZAPINE 10 MG PO TABS: ORAL_TABLET | ORAL | 2 refills | Status: DC
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
## 2023-04-23 ENCOUNTER — Emergency Department (HOSPITAL_BASED_OUTPATIENT_CLINIC_OR_DEPARTMENT_OTHER)
Admission: EM | Admit: 2023-04-23 | Discharge: 2023-04-23 | Disposition: A | Discharge status: Home / Self Care | Payer: MEDICAID | Attending: Emergency Medicine | Admitting: Emergency Medicine

## 2023-04-23 ENCOUNTER — Telehealth: Payer: Self-pay | Admitting: Physician Assistant

## 2023-04-23 ENCOUNTER — Other Ambulatory Visit: Payer: Self-pay

## 2023-04-23 ENCOUNTER — Emergency Department (HOSPITAL_BASED_OUTPATIENT_CLINIC_OR_DEPARTMENT_OTHER): Payer: MEDICAID | Admitting: Radiology

## 2023-04-23 ENCOUNTER — Encounter (HOSPITAL_BASED_OUTPATIENT_CLINIC_OR_DEPARTMENT_OTHER): Payer: Self-pay | Admitting: Emergency Medicine

## 2023-04-23 DIAGNOSIS — R634 Abnormal weight loss: Secondary | ICD-10-CM | POA: Insufficient documentation

## 2023-04-23 DIAGNOSIS — Z202 Contact with and (suspected) exposure to infections with a predominantly sexual mode of transmission: Secondary | ICD-10-CM | POA: Insufficient documentation

## 2023-04-23 LAB — CBC WITH DIFFERENTIAL/PLATELET
Abs Immature Granulocytes: 0 10*3/uL (ref 0.00–0.07)
Basophils Absolute: 0 10*3/uL (ref 0.0–0.1)
Basophils Relative: 1 %
Eosinophils Absolute: 0 10*3/uL (ref 0.0–0.5)
Eosinophils Relative: 1 %
HCT: 46.2 % (ref 39.0–52.0)
Hemoglobin: 16.1 g/dL (ref 13.0–17.0)
Immature Granulocytes: 0 %
Lymphocytes Relative: 36 %
Lymphs Abs: 1.5 10*3/uL (ref 0.7–4.0)
MCH: 32.9 pg (ref 26.0–34.0)
MCHC: 34.8 g/dL (ref 30.0–36.0)
MCV: 94.3 fL (ref 80.0–100.0)
Monocytes Absolute: 0.5 10*3/uL (ref 0.1–1.0)
Monocytes Relative: 12 %
Neutro Abs: 2.1 10*3/uL (ref 1.7–7.7)
Neutrophils Relative %: 50 %
Platelets: 223 10*3/uL (ref 150–400)
RBC: 4.9 MIL/uL (ref 4.22–5.81)
RDW: 13.2 % (ref 11.5–15.5)
WBC: 4.1 10*3/uL (ref 4.0–10.5)
nRBC: 0 % (ref 0.0–0.2)

## 2023-04-23 LAB — URINALYSIS, ROUTINE W REFLEX MICROSCOPIC
Glucose, UA: NEGATIVE mg/dL
Hgb urine dipstick: NEGATIVE
Ketones, ur: 15 mg/dL — AB
Leukocytes,Ua: NEGATIVE
Nitrite: NEGATIVE
Specific Gravity, Urine: 1.04 — ABNORMAL HIGH (ref 1.005–1.030)
pH: 6.5 (ref 5.0–8.0)

## 2023-04-23 LAB — BASIC METABOLIC PANEL
Anion gap: 10 (ref 5–15)
BUN: 8 mg/dL (ref 6–20)
CO2: 26 mmol/L (ref 22–32)
Calcium: 9.8 mg/dL (ref 8.9–10.3)
Chloride: 104 mmol/L (ref 98–111)
Creatinine, Ser: 1.03 mg/dL (ref 0.61–1.24)
GFR, Estimated: >60 mL/min (ref >60)
Glucose, Bld: 83 mg/dL (ref 70–99)
Potassium: 3.9 mmol/L (ref 3.5–5.1)
Sodium: 140 mmol/L (ref 135–145)

## 2023-04-23 LAB — HIV ANTIBODY (ROUTINE TESTING W REFLEX): HIV Screen 4th Generation wRfx: NONREACTIVE

## 2023-04-23 NOTE — ED Notes (Signed)
Spoke with lab to add on gc/chlamydia urine

## 2023-04-23 NOTE — Discharge Instructions (Addendum)
As discussed, your labs and imaging of your chest appeared normal.  Follow MyChart for the results of your STD testing.  Recommend follow-up with the health department, primary care or infectious disease provider if you test positive for 1 of these illnesses.  Please do not hesitate to return if the worrisome signs and symptoms we discussed become apparent.

## 2023-04-23 NOTE — ED Notes (Signed)
Pt refused to be swabbed for Covid, EDP Cooper updated

## 2023-04-23 NOTE — Progress Notes (Signed)
The patient no-showed for appointment despite this provider sending direct link and waiting for at least 10 minutes from appointment time for patient to join. They will be marked as a NS for this appointment/time.   Piedad Climes, PA-C

## 2023-04-23 NOTE — ED Notes (Signed)
Pt. returned from XR. 

## 2023-04-23 NOTE — ED Notes (Signed)
Patient transported to X-ray 

## 2023-04-23 NOTE — ED Triage Notes (Signed)
Pt arrived POV from home, caox4, ambulatory, NAD stating received oral sex from a male approx 1 yr ago and that he has had lesions on the penis and got concerned because he has been having night sweats and weight loss and would primarily like to be checked for HIV.

## 2023-04-23 NOTE — ED Provider Notes (Signed)
[place] EMERGENCY DEPARTMENT AT [facility] Provider Note   CSN: [id number] Arrival date & time: 04/23/23  1245     History  Chief Complaint  Patient presents with   SEXUALLY TRANSMITTED DISEASE    Jake Neal is a 32 y.o. male.  HPI   32 year old male presents emergency department with complaints of unintentional weight loss.  Patient states that he has lost 40 to 50 pounds unintentionally over the past year or so.  Does report some decreased appetite however but denies any attempt to decrease his food intake or increased exercise.  States that he is concerned about potentially contracted HIV.  Does report unprotected sexual intercourse around a year ago.  Currently denying any chest pain, shortness of breath, cough, abdominal pain, nausea, vomiting, urinary symptoms, penile discharge, rash, change in bowel habits.  Denies any IV drug use or recent imprisonment.  Past medical history significant for schizophrenia, anxiety, depression  Home Medications Prior to Admission medications   Medication Sig Start Date End Date Taking? Authorizing Provider  OLANZapine (ZYPREXA) 10 MG tablet Take 0.5 tablets (5 mg total) by mouth daily for 6 days, THEN 1 tablet (10 mg total) daily. 03/05/23 03/10/24  Meta Hatchet, PA  Olopatadine HCl 0.2 % SOLN Use 1 drop in each eye daily for 7 days 05/15/22   Lilland, Percival Spanish, DO      Allergies    Patient has no known allergies.    Review of Systems   Review of Systems  All other systems reviewed and are negative.   Physical Exam Updated Vital Signs BP 127/86   Pulse (!) 56   Temp 98.6 F (37 C) (Oral)   Resp 16   Ht 5\' 11"  (1.803 m)   Wt 84.1 kg   SpO2 100%   BMI 25.84 kg/m  Physical Exam Vitals and nursing note reviewed.  Constitutional:      General: He is not in acute distress.    Appearance: He is well-developed.  HENT:     Head: Normocephalic and atraumatic.  Eyes:     Conjunctiva/sclera: Conjunctivae normal.   Cardiovascular:     Rate and Rhythm: Normal rate and regular rhythm.     Heart sounds: No murmur heard. Pulmonary:     Effort: Pulmonary effort is normal. No respiratory distress.     Breath sounds: Normal breath sounds.  Abdominal:     Palpations: Abdomen is soft.     Tenderness: There is no abdominal tenderness.  Musculoskeletal:        General: No swelling.     Cervical back: Neck supple.  Skin:    General: Skin is warm and dry.     Capillary Refill: Capillary refill takes less than 2 seconds.  Neurological:     Mental Status: He is alert.  Psychiatric:        Mood and Affect: Mood normal.     ED Results / Procedures / Treatments   Labs (all labs ordered are listed, but only abnormal results are displayed) Labs Reviewed  URINALYSIS, ROUTINE W REFLEX MICROSCOPIC - Abnormal; Notable for the following components:      Result Value   Specific Gravity, Urine 1.040 (*)    Bilirubin Urine SMALL (*)    Ketones, ur 15 (*)    Protein, ur TRACE (*)    All other components within normal limits  BASIC METABOLIC PANEL  CBC WITH DIFFERENTIAL/PLATELET  RPR  HIV ANTIBODY (ROUTINE TESTING W REFLEX)  GC/CHLAMYDIA PROBE AMP (  [place]) NOT AT [facility]    EKG None  Radiology DG Chest 2 View  Result Date: 04/23/2023 CLINICAL DATA:  Cough EXAM: CHEST - 2 VIEW COMPARISON:  09/16/2019 FINDINGS: The heart size and mediastinal contours are within normal limits. Both lungs are clear. The visualized skeletal structures are unremarkable. IMPRESSION: No active cardiopulmonary disease. Electronically Signed   By: Duanne Guess D.O.   On: 04/23/2023 14:19    Procedures Procedures    Medications Ordered in ED Medications - No data to display  ED Course/ Medical Decision Making/ A&P                                 Medical Decision Making Amount and/or Complexity of Data Reviewed Labs: ordered. Radiology: ordered.   This patient presents to the ED for concern of unintentional  weight loss, concern for STD, this involves an extensive number of treatment options, and is a complaint that carries with it a high risk of complications and morbidity.  The differential diagnosis includes HIV, TB, malignancy, decreased p.o. intake, other   Co morbidities that complicate the patient evaluation  See HPI   Additional history obtained:  Additional history obtained from EMR External records from outside source obtained and reviewed including hospital records   Lab Tests:  I Ordered, and personally interpreted labs.  The pertinent results include: UA with trace protein, 15 ketones, small bilirubin but otherwise unremarkable.  BNP normal.  CBC normal.  STD testing pending   Imaging Studies ordered:  I ordered imaging studies including chest xray I independently visualized and interpreted imaging which showed no acute cardiopulmonary abnormalities I agree with the radiologist interpretation   Cardiac Monitoring: / EKG:  The patient was maintained on a cardiac monitor.  I personally viewed and interpreted the cardiac monitored which showed an underlying rhythm of: Sinus rhythm   Consultations Obtained:  N/a   Problem List / ED Course / Critical interventions / Medication management  STD exposure.  Unintentional weight loss Reevaluation of the patient showed that the patient stayed the same I have reviewed the patients home medicines and have made adjustments as needed   Social Determinants of Health:  Denies tobacco, licit drug use.   Test / Admission - Considered:  STD exposure.  Unintentional weight loss Vitals signs within normal range and stable throughout visit. Laboratory/imaging studies significant for: See above 32 year old male presents emergency department with complaints of unintentional weight loss.  Per patient, has lost 40 to 50 pounds over the past year unintentionally.  He is does state that he is felt like his appetite has decreased  somewhat but is not intended to eat less or is not changed his exercise patterns.  Patient with unprotected sexual intercourse around a year ago with concern for STD.  Labs reassuring.  Chest x-ray without signs of any acute cardiopulmonary abnormalities or concerns for TB.  Patient without history of IV drug use, imprisonment, international travel.  Patient most concerned about HIV given weight loss in the setting of unprotected sexual intercourse.  STD testing pending.  Will recommend follow-up with health department, primary care for reassessment of symptoms as well as following MyChart for results of STD testing.  Further workup deemed unnecessary at this time on the ED.  Patient overall well-appearing, afebrile in no acute distress. Worrisome signs and symptoms were discussed with the patient, and the patient acknowledged understanding to return to the ED if  noticed. Patient was stable upon discharge.          Final Clinical Impression(s) / ED Diagnoses Final diagnoses:  Possible exposure to STD  Weight loss, unintentional    Rx / DC Orders ED Discharge Orders     None         Peter Garter, [place] 04/23/23 1601    Rondel Baton, MD 04/23/23 [phone]

## 2023-04-24 LAB — GC/CHLAMYDIA PROBE AMP (~~LOC~~) NOT AT ARMC
Chlamydia: NEGATIVE
Comment: NEGATIVE
Comment: NORMAL
Neisseria Gonorrhea: NEGATIVE

## 2023-04-24 LAB — RPR: RPR Ser Ql: NONREACTIVE

## 2023-05-07 ENCOUNTER — Encounter (HOSPITAL_COMMUNITY): Payer: Self-pay | Admitting: Physician Assistant

## 2023-05-07 ENCOUNTER — Telehealth (HOSPITAL_COMMUNITY): Payer: Self-pay | Admitting: Physician Assistant

## 2023-05-07 ENCOUNTER — Other Ambulatory Visit: Payer: Self-pay

## 2023-05-07 ENCOUNTER — Ambulatory Visit (INDEPENDENT_AMBULATORY_CARE_PROVIDER_SITE_OTHER): Payer: MEDICAID | Admitting: Physician Assistant

## 2023-05-07 DIAGNOSIS — F4310 Post-traumatic stress disorder, unspecified: Secondary | ICD-10-CM | POA: Diagnosis not present

## 2023-05-07 DIAGNOSIS — F329 Major depressive disorder, single episode, unspecified: Secondary | ICD-10-CM

## 2023-05-07 DIAGNOSIS — F200 Paranoid schizophrenia: Secondary | ICD-10-CM

## 2023-05-07 MED ORDER — OLANZAPINE 10 MG PO TABS: ORAL_TABLET | ORAL | 2 refills | Status: DC

## 2023-05-07 MED ORDER — OLANZAPINE 10 MG PO TABS
ORAL_TABLET | ORAL | 1 refills | Status: DC
Start: 2023-05-07 — End: 2023-06-18
  Filled 2023-05-07: qty 30, 33d supply, fill #0
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
## 2023-05-08 ENCOUNTER — Other Ambulatory Visit: Payer: Self-pay

## 2023-05-13 NOTE — Telephone Encounter (Signed)
Message acknowledged and reviewed.

## 2023-06-18 ENCOUNTER — Encounter (HOSPITAL_COMMUNITY): Payer: Self-pay | Admitting: Physician Assistant

## 2023-06-18 ENCOUNTER — Ambulatory Visit (HOSPITAL_COMMUNITY): Payer: MEDICAID | Admitting: Physician Assistant

## 2023-06-18 VITALS — BP 136/77 | HR 94 | Temp 98.8°F | Ht 71.0 in | Wt 181.0 lb

## 2023-06-18 DIAGNOSIS — F329 Major depressive disorder, single episode, unspecified: Secondary | ICD-10-CM | POA: Diagnosis not present

## 2023-06-18 DIAGNOSIS — F200 Paranoid schizophrenia: Secondary | ICD-10-CM | POA: Diagnosis not present

## 2023-06-18 DIAGNOSIS — Z79899 Other long term (current) drug therapy: Secondary | ICD-10-CM | POA: Insufficient documentation

## 2023-06-18 DIAGNOSIS — F4310 Post-traumatic stress disorder, unspecified: Secondary | ICD-10-CM | POA: Diagnosis not present

## 2023-06-18 MED ORDER — OLANZAPINE 10 MG PO TABS: 10.0000 mg | ORAL_TABLET | Freq: Every day | ORAL | 1 refills | Status: DC
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
## 2023-08-06 ENCOUNTER — Encounter (HOSPITAL_COMMUNITY): Payer: MEDICAID | Admitting: Physician Assistant

## 2023-08-11 ENCOUNTER — Ambulatory Visit (HOSPITAL_COMMUNITY): Payer: MEDICAID | Admitting: Physician Assistant

## 2023-08-11 VITALS — BP 117/82 | HR 88 | Temp 99.3°F | Ht 71.0 in | Wt 172.4 lb

## 2023-08-11 DIAGNOSIS — F200 Paranoid schizophrenia: Secondary | ICD-10-CM | POA: Diagnosis not present

## 2023-08-11 DIAGNOSIS — F411 Generalized anxiety disorder: Secondary | ICD-10-CM | POA: Diagnosis not present

## 2023-08-11 DIAGNOSIS — F329 Major depressive disorder, single episode, unspecified: Secondary | ICD-10-CM | POA: Diagnosis not present

## 2023-08-11 MED ORDER — ARIPIPRAZOLE 10 MG PO TABS: 10.0000 mg | ORAL_TABLET | Freq: Every day | ORAL | 1 refills | Status: DC

## 2023-08-11 MED ORDER — ARIPIPRAZOLE 5 MG PO TABS: ORAL_TABLET | ORAL | 0 refills | Status: AC

## 2023-08-12 ENCOUNTER — Encounter (HOSPITAL_COMMUNITY): Payer: Self-pay | Admitting: Physician Assistant
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
## 2023-08-19 ENCOUNTER — Telehealth (HOSPITAL_COMMUNITY): Payer: Self-pay | Admitting: *Deleted

## 2023-08-20 NOTE — Telephone Encounter (Signed)
 Screened By nursing staff*  Hello,    Called Pharmacy to see what the issue is and have them over copy of insurance. Called Aunt back that has a approved ROI. Aunt will be picking up meds when she can.    Thanks,    JNL

## 2023-09-08 ENCOUNTER — Telehealth (HOSPITAL_COMMUNITY): Payer: Self-pay | Admitting: Physician Assistant

## 2023-09-22 ENCOUNTER — Ambulatory Visit (INDEPENDENT_AMBULATORY_CARE_PROVIDER_SITE_OTHER): Payer: MEDICAID | Admitting: Physician Assistant

## 2023-09-22 VITALS — BP 119/73 | HR 69 | Temp 98.2°F | Ht 71.0 in | Wt 178.0 lb

## 2023-09-22 DIAGNOSIS — F411 Generalized anxiety disorder: Secondary | ICD-10-CM | POA: Diagnosis not present

## 2023-09-22 DIAGNOSIS — Z79899 Other long term (current) drug therapy: Secondary | ICD-10-CM | POA: Diagnosis not present

## 2023-09-22 DIAGNOSIS — F200 Paranoid schizophrenia: Secondary | ICD-10-CM | POA: Diagnosis not present

## 2023-09-22 MED ORDER — OLANZAPINE 5 MG PO TBDP: 5.0000 mg | ORAL_TABLET | Freq: Every day | ORAL | 1 refills | Status: DC

## 2023-09-23 ENCOUNTER — Encounter (HOSPITAL_COMMUNITY): Payer: Self-pay | Admitting: Physician Assistant
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
## 2023-11-03 ENCOUNTER — Encounter (HOSPITAL_COMMUNITY): Payer: Self-pay | Admitting: Physician Assistant

## 2023-11-03 ENCOUNTER — Ambulatory Visit (INDEPENDENT_AMBULATORY_CARE_PROVIDER_SITE_OTHER): Payer: MEDICAID | Admitting: Physician Assistant

## 2023-11-03 VITALS — BP 121/64 | HR 78 | Ht 71.0 in | Wt 180.0 lb

## 2023-11-03 DIAGNOSIS — F411 Generalized anxiety disorder: Secondary | ICD-10-CM | POA: Diagnosis not present

## 2023-11-03 DIAGNOSIS — F200 Paranoid schizophrenia: Secondary | ICD-10-CM

## 2023-11-03 DIAGNOSIS — Z79899 Other long term (current) drug therapy: Secondary | ICD-10-CM | POA: Diagnosis not present

## 2023-11-03 DIAGNOSIS — F4310 Post-traumatic stress disorder, unspecified: Secondary | ICD-10-CM | POA: Diagnosis not present
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
## 2023-12-15 ENCOUNTER — Ambulatory Visit (INDEPENDENT_AMBULATORY_CARE_PROVIDER_SITE_OTHER): Payer: MEDICAID | Admitting: Physician Assistant

## 2023-12-15 VITALS — BP 115/74 | HR 73 | Ht 71.0 in | Wt 185.6 lb

## 2023-12-15 DIAGNOSIS — F411 Generalized anxiety disorder: Secondary | ICD-10-CM | POA: Diagnosis not present

## 2023-12-15 DIAGNOSIS — Z79899 Other long term (current) drug therapy: Secondary | ICD-10-CM

## 2023-12-15 DIAGNOSIS — F200 Paranoid schizophrenia: Secondary | ICD-10-CM | POA: Diagnosis not present

## 2023-12-15 DIAGNOSIS — F4310 Post-traumatic stress disorder, unspecified: Secondary | ICD-10-CM

## 2023-12-15 MED ORDER — OLANZAPINE 5 MG PO TBDP: 5.0000 mg | ORAL_TABLET | Freq: Every day | ORAL | 1 refills | Status: DC
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
## 2023-12-16 ENCOUNTER — Encounter (HOSPITAL_COMMUNITY): Payer: Self-pay | Admitting: Physician Assistant

## 2024-01-27 ENCOUNTER — Encounter (HOSPITAL_COMMUNITY): Payer: Self-pay | Admitting: Physician Assistant

## 2024-01-27 ENCOUNTER — Ambulatory Visit (HOSPITAL_COMMUNITY): Payer: MEDICAID | Admitting: Physician Assistant

## 2024-01-27 VITALS — BP 114/83 | HR 70 | Ht 71.0 in | Wt 182.8 lb

## 2024-01-27 DIAGNOSIS — Z79899 Other long term (current) drug therapy: Secondary | ICD-10-CM

## 2024-01-27 DIAGNOSIS — F200 Paranoid schizophrenia: Secondary | ICD-10-CM | POA: Diagnosis not present

## 2024-01-27 DIAGNOSIS — F411 Generalized anxiety disorder: Secondary | ICD-10-CM

## 2024-01-27 DIAGNOSIS — F4310 Post-traumatic stress disorder, unspecified: Secondary | ICD-10-CM | POA: Diagnosis not present
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
## 2024-02-16 ENCOUNTER — Telehealth (HOSPITAL_COMMUNITY): Payer: Self-pay

## 2024-02-16 NOTE — Telephone Encounter (Signed)
 Patient is requesting to placed on the Olanzapine  10 mg tablets instead of the 5 mg tablets . Per patient he had previously been on the 10 mg and reports no symptoms/side effects. Pt stated the 5 mg is causing him side effects.

## 2024-03-03 ENCOUNTER — Ambulatory Visit (HOSPITAL_COMMUNITY): Payer: MEDICAID | Admitting: Physician Assistant

## 2024-03-03 VITALS — BP 116/71 | HR 71 | Temp 99.1°F | Ht 71.0 in | Wt 180.4 lb

## 2024-03-03 DIAGNOSIS — F4310 Post-traumatic stress disorder, unspecified: Secondary | ICD-10-CM

## 2024-03-03 DIAGNOSIS — F200 Paranoid schizophrenia: Secondary | ICD-10-CM

## 2024-03-03 DIAGNOSIS — Z79899 Other long term (current) drug therapy: Secondary | ICD-10-CM

## 2024-03-03 DIAGNOSIS — F411 Generalized anxiety disorder: Secondary | ICD-10-CM

## 2024-03-03 MED ORDER — OLANZAPINE 5 MG PO TBDP: 5.0000 mg | ORAL_TABLET | Freq: Every day | ORAL | 1 refills | Status: DC
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
## 2024-05-05 ENCOUNTER — Ambulatory Visit (INDEPENDENT_AMBULATORY_CARE_PROVIDER_SITE_OTHER): Payer: MEDICAID | Admitting: Physician Assistant

## 2024-05-05 VITALS — BP 124/76 | HR 82 | Ht 71.0 in | Wt 181.0 lb

## 2024-05-05 DIAGNOSIS — F411 Generalized anxiety disorder: Secondary | ICD-10-CM

## 2024-05-05 DIAGNOSIS — F200 Paranoid schizophrenia: Secondary | ICD-10-CM

## 2024-05-05 DIAGNOSIS — F4310 Post-traumatic stress disorder, unspecified: Secondary | ICD-10-CM

## 2024-05-05 DIAGNOSIS — Z79899 Other long term (current) drug therapy: Secondary | ICD-10-CM

## 2024-05-05 MED ORDER — OLANZAPINE 5 MG PO TBDP: 5.0000 mg | ORAL_TABLET | Freq: Every day | ORAL | 1 refills | Status: DC
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
## 2024-05-10 ENCOUNTER — Encounter (HOSPITAL_COMMUNITY): Payer: Self-pay | Admitting: Physician Assistant

## 2024-06-06 ENCOUNTER — Ambulatory Visit (INDEPENDENT_AMBULATORY_CARE_PROVIDER_SITE_OTHER): Payer: MEDICAID

## 2024-06-06 ENCOUNTER — Telehealth (HOSPITAL_COMMUNITY): Payer: Self-pay | Admitting: Physician Assistant

## 2024-06-06 ENCOUNTER — Encounter (HOSPITAL_COMMUNITY): Payer: Self-pay

## 2024-06-06 ENCOUNTER — Other Ambulatory Visit (HOSPITAL_COMMUNITY): Payer: Self-pay | Admitting: Psychiatry

## 2024-06-06 ENCOUNTER — Telehealth (HOSPITAL_COMMUNITY): Payer: Self-pay

## 2024-06-06 DIAGNOSIS — F200 Paranoid schizophrenia: Secondary | ICD-10-CM

## 2024-06-06 DIAGNOSIS — F411 Generalized anxiety disorder: Secondary | ICD-10-CM | POA: Diagnosis not present

## 2024-06-06 DIAGNOSIS — Z79899 Other long term (current) drug therapy: Secondary | ICD-10-CM

## 2024-06-06 DIAGNOSIS — F4310 Post-traumatic stress disorder, unspecified: Secondary | ICD-10-CM | POA: Diagnosis not present

## 2024-06-06 MED ORDER — OLANZAPINE 5 MG PO TABS: 5.0000 mg | ORAL_TABLET | Freq: Every day | ORAL | 3 refills | Status: AC

## 2024-06-06 NOTE — Progress Notes (Signed)
 Pt presented to office for EKG. Pt had signs of paranoia and was very concern about being shocked and remain fidgety while doing the EKG.  Pt was accompanied by his Aunt, who address concerns of his current medication and how PT is not taking it due to medication giving Pt headaches. Pt and his Aunt wants to change the medication.   Provider will follow up with results. CJT-CMA Student.

## 2024-06-06 NOTE — Telephone Encounter (Signed)
 Patient is seen by eddie. currently prescribed OLANZapine  zydis (ZYPREXA ) 5 MG disintegrating tablet.  Patient and his aunt states that this formula gives him a headache. The pill is a small tablet and the pt and family are requesting the regular zyprexa  5 mg instead of disintegrating. Pt appeared paranoid and restless during the EKG. Per aunt he is refusing to take the disintegrating tablet and he has been off medication for awhile.

## 2024-06-06 NOTE — Telephone Encounter (Signed)
 Provider attempted to call the patient 4 times without success. Writer spoke to patient primary provider and was instructed to send Zyprexa  5 mg non disintegrating tablets. Medications sent to preferred pharmacy.

## 2024-06-09 NOTE — Telephone Encounter (Signed)
 Message acknowledged and reviewed.

## 2024-07-07 ENCOUNTER — Encounter (HOSPITAL_COMMUNITY): Payer: MEDICAID | Admitting: Physician Assistant
